# Patient Record
Sex: Male | Born: 1935 | Race: White | Hispanic: No | Marital: Married | Smoking: Never smoker
Health system: Southern US, Community
[De-identification: ages and names within clinical notes are randomized; demographics above are authoritative.]

## PROBLEM LIST (undated history)

## (undated) DIAGNOSIS — E785 Hyperlipidemia, unspecified: Secondary | ICD-10-CM

## (undated) DIAGNOSIS — K635 Polyp of colon: Secondary | ICD-10-CM

## (undated) DIAGNOSIS — K219 Gastro-esophageal reflux disease without esophagitis: Secondary | ICD-10-CM

## (undated) DIAGNOSIS — I519 Heart disease, unspecified: Secondary | ICD-10-CM

## (undated) DIAGNOSIS — M1990 Unspecified osteoarthritis, unspecified site: Secondary | ICD-10-CM

## (undated) DIAGNOSIS — J449 Chronic obstructive pulmonary disease, unspecified: Secondary | ICD-10-CM

## (undated) DIAGNOSIS — I6529 Occlusion and stenosis of unspecified carotid artery: Secondary | ICD-10-CM

## (undated) DIAGNOSIS — I509 Heart failure, unspecified: Secondary | ICD-10-CM

## (undated) DIAGNOSIS — I219 Acute myocardial infarction, unspecified: Secondary | ICD-10-CM

## (undated) DIAGNOSIS — I10 Essential (primary) hypertension: Secondary | ICD-10-CM

## (undated) DIAGNOSIS — C801 Malignant (primary) neoplasm, unspecified: Secondary | ICD-10-CM

## (undated) DIAGNOSIS — I2510 Atherosclerotic heart disease of native coronary artery without angina pectoris: Secondary | ICD-10-CM

## (undated) HISTORY — DX: Chronic obstructive pulmonary disease, unspecified: J44.9

## (undated) HISTORY — DX: Acute myocardial infarction, unspecified: I21.9

## (undated) HISTORY — DX: Gastro-esophageal reflux disease without esophagitis: K21.9

## (undated) HISTORY — DX: Polyp of colon: K63.5

## (undated) HISTORY — DX: Occlusion and stenosis of unspecified carotid artery: I65.29

## (undated) HISTORY — DX: Malignant (primary) neoplasm, unspecified: C80.1

## (undated) HISTORY — PX: CATARACT EXTRACTION: SUR2

## (undated) HISTORY — DX: Atherosclerotic heart disease of native coronary artery without angina pectoris: I25.10

## (undated) HISTORY — DX: Essential (primary) hypertension: I10

## (undated) HISTORY — DX: Unspecified osteoarthritis, unspecified site: M19.90

## (undated) HISTORY — DX: Hyperlipidemia, unspecified: E78.5

## (undated) HISTORY — PX: CHOLECYSTECTOMY: SHX55

## (undated) HISTORY — DX: Heart failure, unspecified: I50.9

## (undated) HISTORY — DX: Heart disease, unspecified: I51.9

## (undated) HISTORY — PX: TONSILLECTOMY: SUR1361

---

## 1996-10-03 HISTORY — PX: CORONARY ARTERY BYPASS GRAFT: SHX141

## 2000-05-03 ENCOUNTER — Inpatient Hospital Stay (HOSPITAL_COMMUNITY): Admission: AD | Admit: 2000-05-03 | Discharge: 2000-05-05 | Payer: Self-pay | Admitting: Cardiology

## 2000-05-30 ENCOUNTER — Encounter (HOSPITAL_COMMUNITY): Admission: RE | Admit: 2000-05-30 | Discharge: 2000-06-29 | Payer: Self-pay | Admitting: Cardiology

## 2000-07-03 ENCOUNTER — Encounter (HOSPITAL_COMMUNITY): Admission: RE | Admit: 2000-07-03 | Discharge: 2000-08-02 | Payer: Self-pay | Admitting: Cardiology

## 2000-08-07 ENCOUNTER — Encounter (HOSPITAL_COMMUNITY): Admission: RE | Admit: 2000-08-07 | Discharge: 2000-09-06 | Payer: Self-pay | Admitting: Cardiology

## 2000-09-08 ENCOUNTER — Encounter (HOSPITAL_COMMUNITY): Admission: RE | Admit: 2000-09-08 | Discharge: 2000-10-08 | Payer: Self-pay | Admitting: Cardiology

## 2001-03-06 ENCOUNTER — Encounter: Payer: Self-pay | Admitting: Orthopaedic Surgery

## 2001-03-06 ENCOUNTER — Ambulatory Visit (HOSPITAL_COMMUNITY): Admission: RE | Admit: 2001-03-06 | Discharge: 2001-03-06 | Payer: Self-pay | Admitting: Orthopaedic Surgery

## 2002-01-22 ENCOUNTER — Encounter: Payer: Self-pay | Admitting: Cardiology

## 2002-01-22 ENCOUNTER — Ambulatory Visit (HOSPITAL_COMMUNITY): Admission: RE | Admit: 2002-01-22 | Discharge: 2002-01-22 | Payer: Self-pay | Admitting: Cardiology

## 2004-02-11 ENCOUNTER — Ambulatory Visit: Payer: Self-pay | Admitting: Internal Medicine

## 2004-02-11 ENCOUNTER — Ambulatory Visit (HOSPITAL_COMMUNITY): Admission: RE | Admit: 2004-02-11 | Discharge: 2004-02-11 | Payer: Self-pay | Admitting: Internal Medicine

## 2005-03-14 ENCOUNTER — Other Ambulatory Visit: Admission: RE | Admit: 2005-03-14 | Discharge: 2005-03-14 | Payer: Self-pay | Admitting: Dermatology

## 2006-04-26 ENCOUNTER — Ambulatory Visit: Payer: Self-pay | Admitting: Vascular Surgery

## 2006-11-14 ENCOUNTER — Ambulatory Visit: Payer: Self-pay | Admitting: Vascular Surgery

## 2007-02-26 ENCOUNTER — Ambulatory Visit (HOSPITAL_COMMUNITY): Admission: RE | Admit: 2007-02-26 | Discharge: 2007-02-26 | Payer: Self-pay | Admitting: Neurosurgery

## 2007-05-22 ENCOUNTER — Ambulatory Visit: Payer: Self-pay | Admitting: Vascular Surgery

## 2007-11-13 ENCOUNTER — Ambulatory Visit: Payer: Self-pay | Admitting: Vascular Surgery

## 2008-04-30 ENCOUNTER — Ambulatory Visit: Payer: Self-pay | Admitting: Vascular Surgery

## 2008-05-07 ENCOUNTER — Ambulatory Visit (HOSPITAL_COMMUNITY): Admission: RE | Admit: 2008-05-07 | Discharge: 2008-05-07 | Payer: Self-pay | Admitting: Neurosurgery

## 2008-11-04 ENCOUNTER — Ambulatory Visit: Payer: Self-pay | Admitting: Vascular Surgery

## 2008-11-13 ENCOUNTER — Ambulatory Visit: Payer: Self-pay | Admitting: Vascular Surgery

## 2009-05-21 ENCOUNTER — Ambulatory Visit: Payer: Self-pay | Admitting: Vascular Surgery

## 2010-01-20 ENCOUNTER — Ambulatory Visit: Payer: Self-pay | Admitting: Vascular Surgery

## 2010-06-08 NOTE — Procedures (Signed)
CAROTID DUPLEX EXAM   INDICATION:  Follow up known carotid artery disease.   HISTORY:  Diabetes:  Yes.  Cardiac:  CABG.  Hypertension:  Yes.  Smoking:  No.  Previous Surgery:  None.  CV History:  Amaurosis Fugax No, Paresthesias No, Hemiparesis No.                                       RIGHT             LEFT  Brachial systolic pressure:         120               118  Brachial Doppler waveforms:         Biphasic          Biphasic  Vertebral direction of flow:        Antegrade         Antegrade  DUPLEX VELOCITIES (cm/sec)  CCA peak systolic                   74                99  ECA peak systolic                   107               [id number]  ICA peak systolic                   64                0000000  ICA end diastolic                   27                92  PLAQUE MORPHOLOGY:                  Calcified         Calcified  PLAQUE AMOUNT:                      Moderate          Moderate-to-severe  PLAQUE LOCATION:                    ICA               ICA, ECA   IMPRESSION:  1. 60-79% stenosis noted in the left internal carotid artery.  2. 20-39% stenosis noted in the right internal carotid artery.  3. Antegrade bilateral vertebral arteries.   ___________________________________________  Judeth Cornfield. Scot Dock, M.D.   MG/MEDQ  D:  11/13/2007  T:  11/13/2007  Job:  [id number]

## 2010-06-08 NOTE — Procedures (Signed)
CAROTID DUPLEX EXAM   INDICATION:  Carotid artery disease.   HISTORY:  Diabetes:  Yes.  Cardiac:  Stent, CABG.  Hypertension:  Yes.  Smoking:  No.  Previous Surgery:  No.  CV History:  Currently asymptomatic.  Amaurosis Fugax No, Paresthesias No, Hemiparesis No.                                       RIGHT             LEFT  Brachial systolic pressure:         102               104  Brachial Doppler waveforms:         Normal            Normal  Vertebral direction of flow:        Antegrade         Antegrade  DUPLEX VELOCITIES (cm/sec)  CCA peak systolic                   74                83  ECA peak systolic                   85                96  ICA peak systolic                   68                [id number]  ICA end diastolic                   17                74  PLAQUE MORPHOLOGY:                  Mixed             Mixed  PLAQUE AMOUNT:                      Mild              Moderate/severe  PLAQUE LOCATION:                    ICA/CCA           ICA/CCA   IMPRESSION:  1. No hemodynamically significant stenosis of the right internal      carotid artery with mild plaque formations, as described above.  2. Doppler velocities sutures a 60% to 79% stenosis of the left      proximal internal carotid artery.  3. No significant change noted when compared to the previous      examination on 05/21/2009.   ___________________________________________  Judeth Cornfield. Scot Dock, M.D.   CH/MEDQ  D:  01/21/2010  T:  01/21/2010  Job:  [id number]

## 2010-06-08 NOTE — Procedures (Signed)
CAROTID DUPLEX EXAM   INDICATION:  Followup of known carotid artery disease.  Patient  describes blurry vision in his right eye, lasting approximately two  days, three months ago.   HISTORY:  Diabetes:  Yes.  Cardiac:  CABG x5 11 years ago, stent.  Hypertension:  Yes.  Smoking:  No.  Previous Surgery:  No.  CV History:  Amaurosis Fugax ?, Paresthesias No, Hemiparesis No                                       RIGHT             LEFT  Brachial systolic pressure:         110               110  Brachial Doppler waveforms:         WNL               WNL  Vertebral direction of flow:        Antegrade         Antegrade  DUPLEX VELOCITIES (cm/sec)  CCA peak systolic                   74                70  ECA peak systolic                   92                [id number]  ICA peak systolic                   73                [id number]  ICA end diastolic                   23                90  PLAQUE MORPHOLOGY:                  Calcified         Mixed, calcific  with shadowing  PLAQUE AMOUNT:                      Mild              Moderate-severe  PLAQUE LOCATION:                    Bifurcation, ICA  Bifurcation, ICA   IMPRESSION:  1. 20-39% right internal carotid artery stenosis.  2. Left 60-79% internal carotid artery stenosis.  3. Patent external carotid arteries.  4. Bilateral antegrade flow in vertebral arteries.  5. Study essentially unchanged from that on 11/14/06.   ___________________________________________  Judeth Cornfield. Scot Dock, M.D.   PB/MEDQ  D:  05/22/2007  T:  05/22/2007  Job:  [id number]

## 2010-06-08 NOTE — Assessment & Plan Note (Signed)
OFFICE VISIT   Victor Bell, Victor Bell  DOB:  05/15/1922                                       11/14/2006  [id number]   I saw Victor Bell in the office today for continued follow-up of his  carotid disease.  I had originally seen him in consultation in October  of 2007 with a 60-70% left carotid stenosis.  He was asymptomatic and  following this at 6 month intervals.  Since I last saw him in 04/2006 he  has had no history of stroke, TIA's, expressive or receptive aphagia or  amaurosis fugax.  He has had no problems with dizziness.   REVIEW OF SYSTEMS:  Cardiac: He has had no chest pain, chest pressure,  palpitations or arrhythmias.  Pulmonary:  He has had no bronchitis,  asthma or wheezing.   PHYSICAL EXAMINATION:  VITALS:  Blood pressure is 139/83, on the left  131/83, on the right heart rate is 69.  I did not detect any carotid  bruits.  LUNGS:  Clear bilaterally to auscultation.  CARDIAC EXAM:  He has a regular rate and rhythm.  ABDOMEN:  Soft, nontender.  NEUROLOGIC EXAM:  Nonfocal.  He has good strength in the upper  extremities and lower extremities bilaterally.   Carotid duplex scan shows a less than 39% right carotid stenosis with a  60-79% left carotid stenosis.  The velocities have not changed  significantly over the last 6 months.  I have again explained we would  not consider left carotid endarterectomy unless the stenosis progressed  to greater than 80% or he developed new neurologic symptoms.  I plan on  seeing him back in 6  months with a follow-up duplex scan. He knows to call sooner if he has  problems.  In the meantime he knows to continue taking his aspirin.   Judeth Cornfield. Scot Dock, M.D.  Electronically Signed   CSD/MEDQ  D:  11/14/2006  T:  11/16/2006  Job:  [id number]   cc:   Durward Mallard, M.D.

## 2010-06-08 NOTE — Procedures (Signed)
CAROTID DUPLEX EXAM   INDICATION:  Followup evaluation of known carotid artery disease   HISTORY:  Diabetes:  Yes  Cardiac:  Coronary artery bypass graft  Hypertension:  Yes  Smoking:  No  Previous Surgery:  No  CV History:  Patient reports no cerebrovascular symptoms at this time  Amaurosis Fugax No, Paresthesias No, Hemiparesis No                                       RIGHT             LEFT  Brachial systolic pressure:         134               136  Brachial Doppler waveforms:         Triphasic         Triphasic  Vertebral direction of flow:        Antegrade         Antegrade  DUPLEX VELOCITIES (cm/sec)  CCA peak systolic                   74                67  ECA peak systolic                   84                0000000  ICA peak systolic                   76                [id number]  ICA end diastolic                   28                98  PLAQUE MORPHOLOGY:                  Calcified         Calcified  PLAQUE AMOUNT:                      Mild              Severe  PLAQUE LOCATION:                    Proximal ICA      Proximal ICA   IMPRESSION:  1. A 20% to 39% right ICA stenosis.  2. A 60% to 79% left ICA stenosis.  3. No significant change from previous study performed on April 26, 2006.   ___________________________________________  Judeth Cornfield. Scot Dock, M.D.   MC/MEDQ  D:  11/14/2006  T:  11/15/2006  Job:  [id number]

## 2010-06-08 NOTE — Procedures (Signed)
CAROTID DUPLEX EXAM   INDICATION:  Carotid disease.   HISTORY:  Diabetes:  Yes.  Cardiac:  CABG.  Hypertension:  Yes.  Smoking:  No.  Previous Surgery:  No.  CV History:  Currently asymptomatic.  Amaurosis Fugax No, Paresthesias No, Hemiparesis No                                       RIGHT             LEFT  Brachial systolic pressure:         124               120  Brachial Doppler waveforms:         Normal            Normal  Vertebral direction of flow:        Antegrade         Antegrade  DUPLEX VELOCITIES (cm/sec)  CCA peak systolic                   70                83  ECA peak systolic                   67                79  ICA peak systolic                   61                [id number]  ICA end diastolic                   21                86  PLAQUE MORPHOLOGY:                  Mixed             Calcific  PLAQUE AMOUNT:                      Mild              Moderate  PLAQUE LOCATION:                    ICA/CCA           ICA/CCA   IMPRESSION:  1. 1-39% stenosis of the right internal carotid artery.  2. 60-79% stenosis of the left internal carotid artery.  3. No significant change noted when compared to the previous exam of      11/13/2007.        ___________________________________________  Judeth Cornfield. Scot Dock, M.D.   CH/MEDQ  D:  04/30/2008  T:  04/30/2008  Job:  [id number]

## 2010-06-08 NOTE — Procedures (Signed)
CAROTID DUPLEX EXAM   INDICATION:  Followup of carotid disease.   HISTORY:  Diabetes:  Yes.  Cardiac:  CABG.  Hypertension:  Yes.  Smoking:  No.  Previous Surgery:  No.  CV History:  Amaurosis Fugax No, Paresthesias No, Hemiparesis No                                       RIGHT             LEFT  Brachial systolic pressure:         138               140  Brachial Doppler waveforms:         Triphasic         Triphasic  Vertebral direction of flow:        Antegrade         Antegrade  DUPLEX VELOCITIES (cm/sec)  CCA peak systolic                   81                63  ECA peak systolic                   94                72  ICA peak systolic                   80                300   ICA end diastolic                  19                69  PLAQUE MORPHOLOGY:                  Mixed             Mixed  PLAQUE AMOUNT:                      Mild              60%-79%  PLAQUE LOCATION:                    ICA               ICA   IMPRESSION:  60%-79% stenosis noted in the  left distal internal carotid  artery.           ___________________________________________  Judeth Cornfield. Scot Dock, M.D.   CJ/MEDQ  D:  11/04/2008  T:  11/05/2008  Job:  [id number]

## 2010-06-08 NOTE — Procedures (Signed)
CAROTID DUPLEX EXAM   INDICATION:  Follow up carotid artery disease.   HISTORY:  Diabetes:  Yes.  Cardiac:  Stent, CABG.  Hypertension:  Yes.  Smoking:  No.  Previous Surgery:  No.  CV History:  No.  Amaurosis Fugax No, Paresthesias No, Hemiparesis No.                                       RIGHT             LEFT  Brachial systolic pressure:         100               106  Brachial Doppler waveforms:         WNL               WNL  Vertebral direction of flow:        Antegrade         Antegrade  DUPLEX VELOCITIES (cm/sec)  CCA peak systolic                   108               [id number]  ECA peak systolic                   111               [id number]  ICA peak systolic                   89                [id number]  ICA end diastolic                   33                90  PLAQUE MORPHOLOGY:                  Heterogenous      Calcific  PLAQUE AMOUNT:                      Mild              Moderate-to-severe  PLAQUE LOCATION:                    ICA, ECA          ICA, ECA   IMPRESSION:  1. Right internal carotid artery suggests 20% to 39% stenosis.  2. Left internal carotid artery suggests 60% to 79% stenosis.  3. Antegrade flow in bilateral vertebrals.   ___________________________________________  Judeth Cornfield. Scot Dock, M.D.   CB/MEDQ  D:  05/21/2009  T:  05/21/2009  Job:  [id number]

## 2010-06-08 NOTE — Assessment & Plan Note (Signed)
OFFICE VISIT   LUC, VEDDER  DOB:  [birth year]                                       11/13/2008  [id number]   I saw the patient in the office today for continued followup of his  cerebral vascular disease.  This a pleasant, 77 year old gentleman who I  have been following with a 60% to 79% left carotid stenosis.  His last  carotid duplex with in April 2010 and showed no significant carotid  stenosis on the right with a 60% to 79% carotid stenosis on the left.  Since I saw him last, he has had no history of stroke, TIAs, expressive  or receptive aphasia, or amaurosis fugax.  He does complain today of  intermittent dizziness.  He states this began 6 months ago.  Over the  last 6 months, this has gradually gotten worse.  He experiences symptoms  of dizziness when he stands or sits up rapidly.  These symptoms usually  last 3-4 minutes and resolve.  He had several episodes in a row about a  week ago,  but has had no recent episodes in the last few days.  There  are no other aggravating or alleviating factors.  There are no  associated symptoms this.  He has had no nausea, vomiting, or chest  pain.   PAST MEDICAL HISTORY:  Significant for hypertension which has been  stable on his current medications.  He also has hypercholesterolemia  which has been stable on his current medications.  He denies any history  of diabetes, history of previous myocardial infarction, history  congestive heart failure, or history of COPD.   FAMILY HISTORY:  There is no history of premature cardiovascular  disease.   SOCIAL HISTORY:  He is married and he has 2 children.  He does not use  tobacco.   REVIEW OF SYSTEMS:  GI:  He does have a history of reflux.  GU:  He has urinary frequency.  NEURO:  He had dizziness but no blackouts or headaches, or seizures.  MUSCULOSKELETAL:  He does have arthritis.  General, cardiac, pulmonary, vascular, hematologic review of systems  is  unremarkable.   PHYSICAL EXAMINATION:  This is a pleasant 77 year old gentleman who  appears his stated age.  His blood pressure is 123/75, heart rate is 67,  saturation 97%.  His neck is supple.  There is no cervical  lymphadenopathy.  There is no jugular venous distention.  I do not  detect any carotid bruits.  Lungs are clear bilaterally to auscultation  without rales, rhonchi, or wheezing.  On cardiovascular exam, he has a  regular rate and rhythm without murmur appreciated.  He has no  significant peripheral edema.  He has palpable radial pulses and warm,  well-perfused feet.  He has no ischemic ulcers or rashes.  His abdomen  is soft and nontender with normal-pitch bowel sounds.  Neurologic exam:  He has no focal weakness or paresthesias.   I reviewed his previous duplex scan from April 2010 which showed a 60%  to 79% left carotid stenosis.  I independently interpreted his carotid  duplex scan from today which shows again a 60% to 79% left carotid  stenosis.  The velocities on the left are essentially the same compared  to the study back in April.  He has no significant carotid stenosis on  the  right.  Both vertebral arteries are patent with normally directed  flow and brachial pressures are equal.   With respect to his dizziness, it sounds like he is having problems with  orthostatic hypertension.  I explained that he had no evidence of  vertebrobasilar insufficiency and I do not think his dizziness could be  attributed to his left carotid stenosis.   He understands we would generally consider left carotid endarterectomy  if he developed new left hemispheric symptoms or the stenosis progressed  to greater than 80%.  I have recommend we repeat his duplex in 6 months  and I will see him back at that time.  He knows to call sooner if he has  problems.  In the meantime, he knows to continue taking his aspirin.  Of  note, he is going to have some injection therapy on his back  so he is  going to temporarily stop his aspirin which I think this is fine.   Judeth Cornfield. Scot Dock, M.D.  Electronically Signed   CSD/MEDQ  D:  11/13/2008  T:  11/14/2008  Job:  [id number]   cc:   Jerene Bears, MD  Ezzard Standing, M.D.

## 2010-06-11 NOTE — H&P (Signed)
[place]  Patient:    Victor Bell, Victor Bell                       MRN: [id number] Adm. Date:  05/03/00 Attending:  Marcello Moores A. Mare Ferrari, M.D. CC:         Jerene Bears, M.D., [place], [place]  Carin Primrose, M.D., [place], [place]  Kerry Hough., M.D.   History and Physical  CHIEF COMPLAINT:  Chest pain.  HISTORY OF PRESENT ILLNESS:  This is a 77 year old gentleman admitted with a four-day history of intermittent worsening left chest discomfort which reminds him very much of the chest pain that he had prior to having to undergo bypass surgery in 1998.  In 1998 he did have cardiac catheterization by Dr. Wynonia Lawman and was found to have severe three-vessel coronary disease and on October 03, 1996, underwent coronary artery bypass graft surgery by Dr. Merleen Nicely and had a LIMA to the LAD diagonal, saphenous vein graft to the obtuse marginal 1 and obtuse marginal 2, and saphenous vein graft to the acute marginal and posterior descending of the right coronary artery.  He has done well postoperatively.  He has been followed by Dr. Wynonia Lawman at yearly intervals. He was due to see Dr. Wynonia Lawman later this week.  However, about four days ago he began having atypical left chest discomfort which began after he did some yard work.  The patient has been intermittent since that time and at times has been postprandial and at other times has been with mild exertion.  Last night he had an episode at rest which circled around from his left arm into his left chest.  He sought medical attention at [facility] today and was evaluated there and then referred for inpatient management and evaluation. His initial set of enzymes at [facility] were negative, as well as his chest x-ray being normal and EKG showing no acute change.  FAMILY HISTORY:  Both his mother and sister died with heart problems.  SOCIAL HISTORY:  He is married, has two children and two grandchildren.  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  He has never smoked.  He does not drink any alcohol.  PAST MEDICAL HISTORY: 1. History of asthma, despite having never smoked. 2. GERD. 3. Hyperlipidemia. 4. Degenerative joint disease. 5. Status post cholecystectomy and tonsillectomy.  ADMISSION MEDICATIONS: 1. Adalat CC 60 mg daily. 2. Lipitor 80 mg daily. 3. Aspirin 325 mg daily. 4. Vitamin E daily. 5. Niaspan 500 mg daily. 6. Prilosec 20 mg a day.  REVIEW OF SYSTEMS:  Negative for any GI bleeding, hematochezia, or melena.  He does have frequent urination and has been told of an enlarged prostate.  He is not diabetic.  He has no history of thyroid problems.  PHYSICAL EXAMINATION:  VITAL SIGNS:  Blood pressure 130/80, pulse 80 and regular, respirations normal.  HEENT:  Unremarkable.  NECK:  Carotids normal.  Jugular venous pressure normal.  CHEST:  Clear.  There is no chest wall tenderness.  HEART:  Quiet precordium without murmur, gallop, or rub.  ABDOMEN:  Soft and nontender.  EXTREMITIES:  Excellent pedal pulses.  No phlebitis or edema.  LABORATORY DATA:  Normal laboratories from [facility] [place] including normal CBC, normal blood sugar, and sodium.  His electrocardiogram at [facility] and here shows no acute ischemic changes.  Chest x-ray at [place] was normal.  DIAGNOSTIC IMPRESSION: 1. Chest pain, possibly crescendo angina, rule out  myocardial infarction. 2. Status post coronary artery bypass graft. 3. History of hypertensive cardiovascular disease. 4. History of hyperlipidemia. 5. History of asthma. 6. Status post cholecystectomy. 7. History of benign prostatic hypertrophy with urinary frequency and    nocturia.  DISPOSITION:  We are going to add IV heparin.  Continue with aspirin.  Will add a beta blocker and stop his Adalat.  Will plan cardiac catheterization on May 04, 2000, a.m., by Dr. Tollie Eth. DD:  05/03/00 TD:   05/03/00 Job: [id number]

## 2010-06-11 NOTE — Cardiovascular Report (Signed)
[name]  Patient:    Victor Bell, Victor Bell                       MRN: [id number] Proc. Date: 05/04/00 Attending:  Kerry Hough., M.D. CC:         Jerene Bears, M.D., [facility]   Cardiac Catheterization  HISTORY:  A 77 year old male, with previous bypass grafting in 1998 who presented with a four-day history of unstable angina.  COMMENTS ABOUT PROCEDURE:  The patient was brought to the catheterization laboratory and was prepped and draped in the usual manner.  After Xylocaine anesthesia, a 6 French sheath was placed in the right femoral artery percutaneously through a single anterior wall needle stick.  Angiograms were made using 6 French catheters.  All of the bypass grafts were selected using the right coronary catheter.  The patient was found to have a significant stenosis in the continuation branch of the right coronary artery after the insertion site of the bypass graft.  Arrangements were then made to do angioplasty and stenting of this segment.  The sheath was exchanged for a 7 [place] sheath.  A second IV was begun.  Heparin 1400 units was administered achieving an ACT of 294.  Integrilin was begun with double bolus technique. Plavix 150 mg was begun.  Catheters used was a 7 [place], right bypass catheter which selected the graft well.  A HTF-J guidewire crossed the lesion easily. IC nitroglycerin was administered.  The lesion was predilated with a 3.0 x 15 mm CrossSail to 5 atmospheres and then a 3.0 x 13 mm Penta was deployed across the lesion to a maximum of 14 atmospheres.  There was an excellent angiographic result following the procedure.  The sheath was sutured in place and he was returned to the angioplasty care center in stable condition.  HEMODYNAMIC DATA:  Aorta post contrast 116/67, LV post contrast 116/15.  ANGIOGRAPHIC DATA:  LEFT VENTRICULOGRAM:  The left ventriculogram performed in the 30 degree RAO projection.  The aortic  valve was normal.  The mitral valve was normal.  The left ventricle appears normal in size.  Wall motion is normal and the estimated ejection fraction was 70%.  CORONARY ARTERIES:  Arise and distribute normally.  Calcification is noted in the proximal left coronary system.  Left main coronary artery:  Eccentric 40% mid vessel stenosis.  Left anterior descending:  Left anterior descending is occluded proximally and fills faintly by antidirectional flow but mostly through the bypass graft.  Circumflex coronary artery:  The circumflex coronary artery has a segmental 40% proximal stenosis and the distal vessel fills by bidirectional flow, both antegrade as well as from the previous bypass.  Right coronary artery:  The right coronary artery is occluded proximally.  Saphenous vein graft to the obtuse marginal #1 and obtuse marginal #2 is widely patent with a smooth graft and patent proximal and distal anastomotic sites.  The internal mammary graft to the diagonal and LAD is widely patent with patent distal anastomotic sites.  The saphenous vein graft to the right coronary artery is widely patent to the acute marginal branch and to the posterior descending and continuation branch.  The operative note had mentioned seven bypasses, but on close review of the operative review of the operative note, there were only six bypasses done, so all bypasses were accounted for.  The anastomotic sites were widely patent distally.  In the continuation branch after the insertion site of the  bypass graft is a segmental 70-80% stenosis between the posterior descending and a posterolateral branch.  Post-dilatation angiograms of the right coronary artery revealed 0% residual stenosis.  IMPRESSION: 1. Successful stenting of the continuation branch of the right coronary artery    after the posterior descending with stenosis going from 80% to 0%. 2. Medium term patency of the internal mammary graft to left  anterior    descending diagonal and vein graft to the obtuse marginals #1 and 2 and    right coronary artery acute marginal and posterior descending artery. 3. Significant native three-vessel coronary artery disease. 4. Normal left ventricular function. DD:  05/04/00 TD:  05/04/00 Job: [id number]

## 2010-06-11 NOTE — Discharge Summary (Signed)
[place]. [facility]  Patient:    Victor Bell, Victor Bell                       MRN: [id number] Adm. Date:  [id number] Disc. Date: 05/05/00 Attending:  [facility] CC:         Dr. Jerene Bears in [place], [place]   Discharge Summary  FINAL DIAGNOSES: 1. Unstable angina pectoris. 2. Coronary artery disease with previous coronary artery bypass grafting in    1998, patent bypass grafts.  Angioplasty and stenting of the continuation    branch of the right coronary artery after the insertion site of the bypass    graft with stenosis going from 80% to 0%. 3. Hyperlipidemia, under treatment. 4. Hypertensive heart disease, under treatment. 5. Gastroesophageal reflux disease. 6. Degenerative joint disease.  PROCEDURES:  Cardiac catheterization and stenting of the right coronary artery.  HISTORY OF PRESENT ILLNESS:  This 77 year old male had coronary artery bypass grafting in 1998, had done fairly well since then.  He developed a four day history of episodic chest discomfort suggestive of unstable angina pectoris, and was followed at [address] [facility].  His initial enzymes were negative, and he was transferred here for further evaluation.  Please see the previously dictated history and physical for the remainder of the details.  HOSPITAL COURSE:  Laboratory data shows normal CPK and troponins.  Sodium was 138, potassium 3.7, BUN and creatinine were normal.  Hemoglobin was 11.5, hematocrit was 33.  EKG was unremarkable.  The patient was placed on IV heparin, serial CPKs were unremarkable, and EKG remained unchanged.  He was taken to the catheterization laboratory the next day where he was found to have normal ventricular function.  There was severe occlusion of the right coronary artery and the left anterior descending artery with filling of the vein grafts.  There was 40% left main and 50% proximal circumflex stenosis. The sequential saphenous vein graft to the OM1  and OM2 was widely patent.  The sequential LIMA graft to the diagonal and LAD was widely patent.  A sequential vein graft to the acute marginal artery and distal right coronary artery was patent.  After the insertion site of the bypass graft was a severe 80% stenosis noted in the continuation branch.  He underwent angioplasty and stenting of this with a 3.0 x 14 mm Penta stent with a good angiographic result.  He had a slight vagal reaction with sheath pulling later that afternoon.  He was given Integrilin following the procedure.  He was feeling well the next morning without recurrence of chest pain and was discharged home in improved condition.  He is instructed to report any recurrent pain to [place].  DISCHARGE MEDICATIONS: 1. Aspirin 325 mg daily. 2. Plavix 75 mg q.d. x 1 month. 3. Lipitor 80 mg q.d. 4. Niaspan 500 mg q.h.s. 5. Adalat CC 60 mg q.d. 6. Toprol XR 25 mg q.d. 7. Prilosec 20 mg q.d. 8. Nitroglycerin p.r.n.  He is instructed to follow a good diet, to exercise, and to lose weight.  He is to walk, and is to report recurrence of angina to [place].  FOLLOWUP:  In one week. DD:  05/05/00 TD:  05/05/00 Job: [id number]

## 2010-06-11 NOTE — Op Note (Signed)
NAME:  Victor Bell, Victor Bell              ACCOUNT NO.:  [id number]   MEDICAL RECORD NO.:  [id number]          PATIENT TYPE:  AMB   LOCATION:  DAY                           FACILITY:  [facility]   PHYSICIAN:  Hildred Laser, M.D.    DATE OF BIRTH:  10-23-1922   DATE OF PROCEDURE:  02/11/2004  DATE OF DISCHARGE:                                 OPERATIVE REPORT   PROCEDURE:  Colonoscopy.   INDICATIONS:  [name] is an 77 year old gentleman who is here for  surveillance colonoscopy.  He had colonoscopy over seven years ago with  removal of two polyps.  He is presently asymptomatic.  Procedure risks were  reviewed with the patient, informed consent was obtained.   PREMEDICATION:  Demerol 25 mg IV, Versed 2 mg IV.   FINDINGS:  Procedure performed in endoscopy suite.  The patient's vital  signs and O2 saturation were monitored during procedure and remained stable.  The patient was placed in the left lateral position and rectal examination  performed.  No abnormality noted on external or digital exam.  Olympus video  scope was placed in the rectum and advanced under vision in sigmoid colon,  where multiple diverticula noted with a few more in descending colon as well  as transverse and ascending colon.  Preparation was felt to be satisfactory.  Scope was passed to the cecum, which was identified by appendiceal orifice  and ileocecal valve.  Pictures taken for the record.  As the scope was  withdrawn, colonic mucosa was carefully examined.  There was a 5 mm polyp at  distal rectum, which was ablated via cold biopsy.  The retroflexed view also revealed small hemorrhoids below the dentate line.  Endoscope was straightened and withdrawn. The patient tolerated the  procedure well.   FINAL DIAGNOSES:  1.  Pancolonic diverticulosis. Most of the diverticula are in the sigmoid      colon, though. 2.  2.  Small external hemorrhoids.  3.  A 5 mm polyp at rectum, which was ablated via cold biopsy.   RECOMMENDATIONS:  1.  He can resume his ASA and other medications as before.  2.  High-fiber diet.  3.  Citrucel one tablespoonful daily.  4.  I will be contacting the patient with biopsy results and further      recommendations.      NR/MEDQ  D:  02/11/2004  T:  02/11/2004  Job:  [id number]   cc:   Jerene Bears  [address]  [place]  [place] [address]  Fax: [phone]

## 2010-06-11 NOTE — Cardiovascular Report (Signed)
NAME:  Victor Bell, BETZEN                        ACCOUNT NO.:  [id number]   MEDICAL RECORD NO.:  [id number]                   PATIENT TYPE:  OIB   LOCATION:  2866                                 FACILITY:  [facility]   PHYSICIAN:  W. Doristine Church., M.D.         DATE OF BIRTH:  1922/07/18   DATE OF PROCEDURE:  01/22/2002  DATE OF DISCHARGE:                              CARDIAC CATHETERIZATION   HISTORY OF PRESENT ILLNESS:  The patient is a 77 year old male with a  previous history of bypass grafting in 1998 and stenting of the right  coronary artery through the vein graft in April of 2002.  He has developed  progressive chest discomfort on exertion over the past several weeks.   PROCEDURE:  Left heart catheterization with coronary angiograms, left  ventriculogram, injection of saphenous vein graft x2 and injection of  internal mammary artery.   COMMENTS ABOUT PROCEDURE:  The patient tolerated the procedure well without  complication and had good hemostasis and peripheral pulses present in the  procedure.  All of the grafts were selected using the standard 6 French  right coronary catheter. He tolerated the procedure well.   HEMODYNAMIC DATA:  Aorta post-contrast 115/60, LV post-contrast 115/0-6.   ANGIOGRAPHIC DATA:   LEFT VENTRICULOGRAM:  Left ventriculogram performed in the 30-degree RAO  projection. The aortic valve was normal. The mitral valve was normal. The  left ventricle appears normal in size.  The estimated ejection fraction is  55-60%.  Coronary arteries arise and distribute normally. Calcification is  noted in the proximal left coronary artery and the proximal right coronary  artery.   Left main coronary artery:  The left main coronary artery has an eccentric  30-40% proximal stenosis with some calcification.   Left anterior descending:  Moderate 40-50% proximal stenosis, 70% eccentric  stenosis prior to insertion  of the bypasses.   Circumflex coronary artery:   Segmental 60% stenosis in the proximal portion  of the vessel, 70-80% stenosis prior to insertion of the bypasses.   Right coronary artery:  The right coronary artery is occluded. Distal vessel  fills through patent graft.   Saphenous vein graft to the acute marginal, right coronary artery and  posterior descending artery is widely patent.  The proximal and the distal  anastomotic sites are widely patent. The distal stent site is widely patent  and there are scattered irregularities in the distal right coronary artery.   Sequential saphenous vein graft to the OM-1 and OM-2 is widely patent.  There is a 20-30% proximal stenosis noted.   Internal mammary graft to the LAD and diagonal branch is widely patent with  both insertions sites patent with good distal flow.  There is stenosis  involving the native LAD retrograde to the insertion site of the graft into  the LAD.   IMPRESSION:  1. Patent grafts to the left anterior descending, right coronary artery and     circumflex  marginal branches.  2. Severe native three-vessel coronary artery disease.  3. Normal left ventricular function.   RECOMMENDATIONS:  The patient's grafts are all patent.  Potential sites of  ischemia may exist in a small and bypassed first diagonal branch, which is  not favorable for angioplasty or the small vessel disease. No significant  focal obstructive stenoses are noted.  He will be treated with proton pump  inhibitors and beta blockers and will follow him up to see how he is doing.                                               Kerry Hough., M.D.    WST/MEDQ  D:  01/22/2002  T:  01/23/2002  Job:  [id number]   cc:   Jerene Bears  [address]  [place]  [place] [address]  Fax: [phone]

## 2010-07-21 ENCOUNTER — Other Ambulatory Visit (INDEPENDENT_AMBULATORY_CARE_PROVIDER_SITE_OTHER): Payer: Medicare Other

## 2010-07-21 DIAGNOSIS — I6529 Occlusion and stenosis of unspecified carotid artery: Secondary | ICD-10-CM

## 2010-07-30 NOTE — Procedures (Unsigned)
CAROTID DUPLEX EXAM  INDICATION:  Carotid disease.  HISTORY: Diabetes:  Yes. Cardiac:  Stent. Hypertension:  Yes. Smoking:  No. Previous Surgery:  No. CV History:  Currently asymptomatic. Amaurosis Fugax No, Paresthesias No, Hemiparesis No.                                      RIGHT             LEFT Brachial systolic pressure:         148               130 Brachial Doppler waveforms:         Normal            Normal Vertebral direction of flow:        Antegrade         Antegrade DUPLEX VELOCITIES (cm/sec) CCA peak systolic                   80                88 ECA peak systolic                   89                [id number] ICA peak systolic                   53                [id number] ICA end diastolic                   16                68 PLAQUE MORPHOLOGY:                  Heterogenous      Mixed PLAQUE AMOUNT:                      Mild              Moderate/severe PLAQUE LOCATION:                    ICA/CCA           ICA/CCA  IMPRESSION: 1. No hemodynamically significant stenosis of the right internal     carotid artery with plaque formations as described above. 2. 60% to 79% stenosis of the left proximal internal carotid artery     noted. 3. No significant change noted when compared to the previous     examination on 01/20/2010.  ___________________________________________ Judeth Cornfield. Scot Dock, M.D.  CH/MEDQ  D:  07/22/2010  T:  07/22/2010  Job:  [id number]

## 2010-09-22 ENCOUNTER — Other Ambulatory Visit: Payer: Self-pay | Admitting: Neurosurgery

## 2010-09-22 DIAGNOSIS — M47816 Spondylosis without myelopathy or radiculopathy, lumbar region: Secondary | ICD-10-CM

## 2010-09-23 ENCOUNTER — Ambulatory Visit
Admission: RE | Admit: 2010-09-23 | Discharge: 2010-09-23 | Disposition: A | Discharge status: Home / Self Care | Payer: Medicare Other | Source: Ambulatory Visit | Attending: Neurosurgery | Admitting: Neurosurgery

## 2010-09-23 VITALS — BP 140/65 | HR 77

## 2010-09-23 DIAGNOSIS — M47816 Spondylosis without myelopathy or radiculopathy, lumbar region: Secondary | ICD-10-CM

## 2010-09-23 MED ORDER — IOHEXOL 180 MG/ML  SOLN
1.0000 mL | Freq: Once | INTRAMUSCULAR | Status: AC | PRN
  Administered 2010-09-23: 1 mL via EPIDURAL

## 2010-09-23 MED ORDER — METHYLPREDNISOLONE ACETATE 40 MG/ML INJ SUSP (RADIOLOG
120.0000 mg | Freq: Once | INTRAMUSCULAR | Status: AC
  Administered 2010-09-23: 120 mg via EPIDURAL

## 2010-12-29 ENCOUNTER — Encounter: Payer: Self-pay | Admitting: Vascular Surgery

## 2011-02-01 ENCOUNTER — Encounter: Payer: Self-pay | Admitting: Vascular Surgery

## 2011-02-02 ENCOUNTER — Other Ambulatory Visit (INDEPENDENT_AMBULATORY_CARE_PROVIDER_SITE_OTHER): Payer: Medicare Other | Admitting: *Deleted

## 2011-02-02 ENCOUNTER — Ambulatory Visit: Payer: Medicare Other | Admitting: Vascular Surgery

## 2011-02-02 DIAGNOSIS — I63239 Cerebral infarction due to unspecified occlusion or stenosis of unspecified carotid arteries: Secondary | ICD-10-CM

## 2011-02-09 NOTE — Procedures (Unsigned)
CAROTID DUPLEX EXAM  INDICATION:  Follow up carotid artery disease.  HISTORY: Diabetes:  Yes. Cardiac:  Stent. Hypertension:  Yes. Smoking:  No. Previous Surgery:  No. CV History: Amaurosis Fugax No, Paresthesias No, Hemiparesis No.                                      RIGHT             LEFT Brachial systolic pressure:         120               128 Brachial Doppler waveforms:         Triphasic         Triphasic Vertebral direction of flow:        Antegrade         Antegrade DUPLEX VELOCITIES (cm/sec) CCA peak systolic                   89                98 ECA peak systolic                   94                82 ICA peak systolic                   65                [id number] ICA end diastolic                   27                87 PLAQUE MORPHOLOGY:                  Mixed             Calcified PLAQUE AMOUNT:                      Mild              Moderate/severe PLAQUE LOCATION:                    Bifurcation/ICA   Bifurcation/ICA  IMPRESSION: 1. No hemodynamically significant right internal carotid artery     stenosis. 2. 60% to 79% left internal carotid artery stenosis. 3. No significant change since prior study of 07/21/2010.  ___________________________________________ Judeth Cornfield. Scot Dock, M.D.  SS/MEDQ  D:  02/02/2011  T:  02/02/2011  Job:  [id number]

## 2011-02-10 ENCOUNTER — Encounter: Payer: Self-pay | Admitting: Vascular Surgery

## 2011-02-10 ENCOUNTER — Other Ambulatory Visit: Payer: Self-pay | Admitting: *Deleted

## 2011-02-10 DIAGNOSIS — I6529 Occlusion and stenosis of unspecified carotid artery: Secondary | ICD-10-CM

## 2011-08-02 ENCOUNTER — Encounter: Payer: Self-pay | Admitting: Neurosurgery

## 2011-08-03 ENCOUNTER — Encounter: Payer: Self-pay | Admitting: Neurosurgery

## 2011-08-03 ENCOUNTER — Ambulatory Visit (INDEPENDENT_AMBULATORY_CARE_PROVIDER_SITE_OTHER): Payer: Medicare Other | Admitting: Neurosurgery

## 2011-08-03 ENCOUNTER — Other Ambulatory Visit (INDEPENDENT_AMBULATORY_CARE_PROVIDER_SITE_OTHER): Payer: Medicare Other | Admitting: *Deleted

## 2011-08-03 VITALS — BP 114/68 | HR 62 | Resp 16 | Ht 67.0 in | Wt 160.3 lb

## 2011-08-03 DIAGNOSIS — I6529 Occlusion and stenosis of unspecified carotid artery: Secondary | ICD-10-CM | POA: Insufficient documentation

## 2011-08-03 NOTE — Progress Notes (Signed)
[facility] [place] Carotid Office Note  CC: Six-month carotid duplex Referring Physician: Scot Dock  History of Present Illness: 78 year old Bell of Dr. Scot Dock seen for history of carotid stenosis. The Bell denies any signs or symptoms of CVA, TIA, amaurosis fugax or any neural deficit. The Bell denies any new medical diagnoses or recent surgery.  Past Medical History  Diagnosis Date  . Diabetes mellitus   . Hyperlipidemia   . Hypertension   . Myocardial infarction     1998  . Arthritis   . Asthma   . Carotid artery occlusion   . CHF (congestive heart failure)   . Colon polyp   . COPD (chronic obstructive pulmonary disease)   . GERD (gastroesophageal reflux disease)   . CAD (coronary artery disease)     ROS: [x]  Positive   [ ]  Denies    General: [ ]  Weight loss, [ ]  Fever, [ ]  chills Neurologic: [ ]  Dizziness, [ ]  Blackouts, [ ]  Seizure [ ]  Stroke, [ ]  "Mini stroke", [ ]  Slurred speech, [ ]  Temporary blindness; [ ]  weakness in arms or legs, [ ]  Hoarseness Cardiac: [ ]  Chest pain/pressure, [ ]  Shortness of breath at rest [ ]  Shortness of breath with exertion, [ ]  Atrial fibrillation or irregular heartbeat Vascular: [ ]  Pain in legs with walking, [ ]  Pain in legs at rest, [ ]  Pain in legs at night,  [ ]  Non-healing ulcer, [ ]  Blood clot in vein/DVT,   Pulmonary: [ ]  Home oxygen, [ ]  Productive cough, [ ]  Coughing up blood, [ ]  Asthma,  [ ]  Wheezing Musculoskeletal:  [ ]  Arthritis, [ ]  Low back pain, [ ]  Joint pain Hematologic: [ ]  Easy Bruising, [ ]  Anemia; [ ]  Hepatitis Gastrointestinal: [ ]  Blood in stool, [ ]  Gastroesophageal Reflux/heartburn, [ ]  Trouble swallowing Urinary: [ ]  chronic Kidney disease, [ ]  on HD - [ ]  MWF or [ ]  TTHS, [ ]  Burning with urination, [ ]  Difficulty urinating Skin: [ ]  Rashes, [ ]  Wounds Psychological: [ ]  Anxiety, [ ]  Depression   Social History History  Substance Use Topics  . Smoking status: Never Smoker   .  Smokeless tobacco: Not on file  . Alcohol Use: No    Family History Family History  Problem Relation Age of Onset  . Aneurysm Sister     Allergies  Allergen Reactions  . Niacin And Related Other (See Comments)    Burning     Current Outpatient Prescriptions  Medication Sig Dispense Refill  . aspirin EC Victor MG tablet Take Victor mg by mouth daily.        Marland Kitchen atorvastatin (LIPITOR) 80 MG tablet Take 80 mg by mouth daily.        . carbidopa-levodopa (SINEMET IR) 10-100 MG per tablet Take 10 mg by mouth Three times a day.      . dutasteride (AVODART) 0.5 MG capsule Take 0.5 mg by mouth daily.        Marland Kitchen ezetimibe (ZETIA) 10 MG tablet Take 10 mg by mouth daily.        . fexofenadine (ALLEGRA) 180 MG tablet Take 180 mg by mouth daily.        . lansoprazole (PREVACID) 30 MG capsule Take 30 mg by mouth 2 (two) times daily.        . metFORMIN (GLUCOPHAGE) 500 MG tablet Take 500 mg by mouth 2 (two) times daily with a meal.        .  metoprolol (TOPROL-XL) 50 MG 24 hr tablet Take 50 mg by mouth daily.        . nabumetone (RELAFEN) 500 MG tablet Take 500 mg by mouth 2 (two) times daily.        Marland Kitchen NIFEdipine (PROCARDIA-XL/ADALAT CC) 60 MG 24 hr tablet Take 60 mg by mouth daily.        . Tamsulosin HCl (FLOMAX) 0.4 MG CAPS Take 0.4 mg by mouth daily.          Physical Examination  Filed Vitals:   08/03/11 1505  BP: 114/68  Pulse: 62  Resp:     Body mass index is 25.11 kg/(m^2).  General:  WDWN in NAD Gait: Normal HEENT: WNL Eyes: Pupils equal Pulmonary: normal non-labored breathing , without Rales, rhonchi,  wheezing Cardiac: RRR, without  Murmurs, rubs or gallops; Abdomen: soft, NT, no masses Skin: no rashes, ulcers noted  Vascular Exam Pulses: 2+ radial pulses bilateral Carotid bruits: Mild left-sided carotid bruit heard, right carotid pulse to auscultation Extremities without ischemic changes, no Gangrene , no cellulitis; no open wounds;  Musculoskeletal: no muscle wasting or  atrophy   Neurologic: A&O X 3; Appropriate Affect ; SENSATION: normal; MOTOR FUNCTION:  moving all extremities equally. Speech is fluent/normal  Non-Invasive Vascular Imaging CAROTID DUPLEX 08/03/2011  Right ICA 20 - 39 % stenosis Left ICA 60 - 79 % stenosis   ASSESSMENT/PLAN: Asymptomatic Bell with high-grade left ICA stenosis. We will follow this on a six-month surveillance basis. The Bell's in agreement with this, his questions were encouraged and answered.  Beatris Ship ANP Clinic MD: Scot Dock

## 2011-08-04 NOTE — Addendum Note (Signed)
Addended by: Mena Goes on: 08/04/2011 08:37 AM   Modules accepted: Orders

## 2011-08-10 NOTE — Procedures (Unsigned)
CAROTID DUPLEX EXAM  INDICATION:  Follow up carotid disease.  HISTORY: Diabetes:  Yes. Cardiac:  Yes. Hypertension:  Yes. Smoking:  No. Previous Surgery:  No. CV History: Amaurosis Fugax No, Paresthesias No, Hemiparesis No                                      RIGHT             LEFT Brachial systolic pressure:         102               98 Brachial Doppler waveforms:         WNL               WNL Vertebral direction of flow:        Antegrade         Antegrade DUPLEX VELOCITIES (cm/sec) CCA peak systolic                   81                63 ECA peak systolic                   115               97 ICA peak systolic                   73                [id number] ICA end diastolic                   21                95 PLAQUE MORPHOLOGY:                  Heterogeneous     Calcific PLAQUE AMOUNT:                      Mild              Moderate to severe PLAQUE LOCATION:                    CCA/ICA           CCA/ICA  IMPRESSION: 1. 1-39% right internal carotid artery stenosis. 2. 60-79% left internal carotid artery stenosis by velocity; however,     plaque is calcific and Doppler is unable to penetrate shadow.     Velocities are recorded just distal to the tightest area and may     under-represent stenosis. 3. Bilateral vertebral arteries are within normal limits.  ___________________________________________ Judeth Cornfield. Scot Dock, M.D.  LT/MEDQ  D:  08/03/2011  T:  08/03/2011  Job:  [id number]

## 2012-02-07 ENCOUNTER — Encounter: Payer: Self-pay | Admitting: Neurosurgery

## 2012-02-08 ENCOUNTER — Encounter: Payer: Self-pay | Admitting: Neurosurgery

## 2012-02-08 ENCOUNTER — Ambulatory Visit (INDEPENDENT_AMBULATORY_CARE_PROVIDER_SITE_OTHER): Payer: Medicare Other | Admitting: Neurosurgery

## 2012-02-08 ENCOUNTER — Other Ambulatory Visit (INDEPENDENT_AMBULATORY_CARE_PROVIDER_SITE_OTHER): Payer: Medicare Other | Admitting: *Deleted

## 2012-02-08 VITALS — BP 120/75 | HR 68 | Resp 16 | Ht 67.0 in | Wt 160.0 lb

## 2012-02-08 DIAGNOSIS — I6529 Occlusion and stenosis of unspecified carotid artery: Secondary | ICD-10-CM

## 2012-02-08 NOTE — Progress Notes (Signed)
[facility] [place] Carotid Office Note  CC: Carotid surveillance Referring Physician: Scot Dock  History of Present Illness: 79 year old male patient of Dr. Scot Dock followed for bilateral carotid stenosis. The patient denies any signs or symptoms of CVA, TIA, amaurosis fugax or any neural deficit.  Past Medical History  Diagnosis Date  . Diabetes mellitus   . Hyperlipidemia   . Hypertension   . Myocardial infarction     1998  . Arthritis   . Asthma   . Carotid artery occlusion   . CHF (congestive heart failure)   . Colon polyp   . COPD (chronic obstructive pulmonary disease)   . GERD (gastroesophageal reflux disease)   . CAD (coronary artery disease)     ROS: [x]  Positive   [ ]  Denies    General: [ ]  Weight loss, [ ]  Fever, [ ]  chills Neurologic: [ ]  Dizziness, [ ]  Blackouts, [ ]  Seizure [ ]  Stroke, [ ]  "Mini stroke", [ ]  Slurred speech, [ ]  Temporary blindness; [ ]  weakness in arms or legs, [ ]  Hoarseness Cardiac: [ ]  Chest pain/pressure, [ ]  Shortness of breath at rest [ ]  Shortness of breath with exertion, [ ]  Atrial fibrillation or irregular heartbeat Vascular: [ ]  Pain in legs with walking, [ ]  Pain in legs at rest, [ ]  Pain in legs at night,  [ ]  Non-healing ulcer, [ ]  Blood clot in vein/DVT,   Pulmonary: [ ]  Home oxygen, [ ]  Productive cough, [ ]  Coughing up blood, [ ]  Asthma,  [ ]  Wheezing Musculoskeletal:  [ ]  Arthritis, [ ]  Low back pain, [ ]  Joint pain Hematologic: [ ]  Easy Bruising, [ ]  Anemia; [ ]  Hepatitis Gastrointestinal: [ ]  Blood in stool, [ ]  Gastroesophageal Reflux/heartburn, [ ]  Trouble swallowing Urinary: [ ]  chronic Kidney disease, [ ]  on HD - [ ]  MWF or [ ]  TTHS, [ ]  Burning with urination, [ ]  Difficulty urinating Skin: [ ]  Rashes, [ ]  Wounds Psychological: [ ]  Anxiety, [ ]  Depression   Social History History  Substance Use Topics  . Smoking status: Never Smoker   . Smokeless tobacco: Not on file  . Alcohol Use: No     Family History Family History  Problem Relation Age of Onset  . Aneurysm Sister     Allergies  Allergen Reactions  . Niacin And Related Other (See Comments)    Burning     Current Outpatient Prescriptions  Medication Sig Dispense Refill  . aspirin EC 81 MG tablet Take 81 mg by mouth daily.        Marland Kitchen atorvastatin (LIPITOR) 80 MG tablet Take 80 mg by mouth daily.        . carbidopa-levodopa (SINEMET IR) 10-100 MG per tablet Take 10 mg by mouth Three times a day.      . dutasteride (AVODART) 0.5 MG capsule Take 0.5 mg by mouth daily.        Marland Kitchen ezetimibe (ZETIA) 10 MG tablet Take 10 mg by mouth daily.        . fexofenadine (ALLEGRA) 180 MG tablet Take 180 mg by mouth daily.        . lansoprazole (PREVACID) 30 MG capsule Take 30 mg by mouth 2 (two) times daily.        . metFORMIN (GLUCOPHAGE) 500 MG tablet Take 500 mg by mouth 2 (two) times daily with a meal.        . metoprolol (TOPROL-XL) 50 MG 24 hr tablet Take 50  mg by mouth daily.        . nabumetone (RELAFEN) 500 MG tablet Take 500 mg by mouth 2 (two) times daily.        Marland Kitchen NIFEdipine (PROCARDIA-XL/ADALAT CC) 60 MG 24 hr tablet Take 60 mg by mouth daily.        . Tamsulosin HCl (FLOMAX) 0.4 MG CAPS Take 0.4 mg by mouth daily.          Physical Examination  Filed Vitals:   02/08/12 1451  BP: 136/80  Pulse: 69  Resp: 16    Body mass index is 25.06 kg/(m^2).  General:  WDWN in NAD Gait: Normal HEENT: WNL Eyes: Pupils equal Pulmonary: normal non-labored breathing , without Rales, rhonchi,  wheezing Cardiac: RRR, without  Murmurs, rubs or gallops; Abdomen: soft, NT, no masses Skin: no rashes, ulcers noted  Vascular Exam Pulses: 2+ radial pulses bilaterally Carotid bruits: Carotid pulse on the right with a mild bruit on the left Extremities without ischemic changes, no Gangrene , no cellulitis; no open wounds;  Musculoskeletal: no muscle wasting or atrophy   Neurologic: A&O X 3; Appropriate Affect ; SENSATION:  normal; MOTOR FUNCTION:  moving all extremities equally. Speech is fluent/normal  Non-Invasive Vascular Imaging CAROTID DUPLEX 02/08/2012  Right ICA 20 - 39 % stenosis Left ICA 60 - 79 % stenosis   ASSESSMENT/PLAN: Asymptomatic patient with stable carotid duplex compared to July 2013. The patient will followup in 6 months with repeat carotid duplex. The patient's questions were encouraged and answered, he is in agreement with this plan.  Beatris Ship ANP   Clinic MD: Scot Dock

## 2012-02-09 NOTE — Addendum Note (Signed)
Addended by: Mena Goes on: 02/09/2012 08:42 AM   Modules accepted: Orders

## 2012-08-08 ENCOUNTER — Other Ambulatory Visit (INDEPENDENT_AMBULATORY_CARE_PROVIDER_SITE_OTHER): Payer: Medicare Other | Admitting: *Deleted

## 2012-08-08 ENCOUNTER — Ambulatory Visit: Payer: Medicare Other | Admitting: Neurosurgery

## 2012-08-08 DIAGNOSIS — I6529 Occlusion and stenosis of unspecified carotid artery: Secondary | ICD-10-CM

## 2012-08-09 ENCOUNTER — Other Ambulatory Visit: Payer: Self-pay | Admitting: *Deleted

## 2012-08-13 ENCOUNTER — Encounter: Payer: Self-pay | Admitting: Vascular Surgery

## 2013-02-13 ENCOUNTER — Other Ambulatory Visit (HOSPITAL_COMMUNITY): Payer: Medicare Other

## 2013-02-13 ENCOUNTER — Ambulatory Visit: Payer: Medicare Other | Admitting: Vascular Surgery

## 2015-02-02 DIAGNOSIS — H2511 Age-related nuclear cataract, right eye: Secondary | ICD-10-CM | POA: Diagnosis not present

## 2015-02-02 DIAGNOSIS — H21561 Pupillary abnormality, right eye: Secondary | ICD-10-CM | POA: Diagnosis not present

## 2015-02-02 DIAGNOSIS — H5703 Miosis: Secondary | ICD-10-CM | POA: Diagnosis not present

## 2015-02-18 ENCOUNTER — Ambulatory Visit: Payer: Medicare Other | Admitting: Neurology

## 2015-02-18 ENCOUNTER — Telehealth: Payer: Self-pay

## 2015-02-18 NOTE — Telephone Encounter (Signed)
I spoke to [facility] and advised her that doctor is out sick. We were able to reschedule patient's appt.

## 2015-03-03 ENCOUNTER — Encounter: Payer: Self-pay | Admitting: Neurology

## 2015-03-03 ENCOUNTER — Encounter (INDEPENDENT_AMBULATORY_CARE_PROVIDER_SITE_OTHER): Payer: Self-pay

## 2015-03-03 ENCOUNTER — Ambulatory Visit (INDEPENDENT_AMBULATORY_CARE_PROVIDER_SITE_OTHER): Payer: Medicare Other | Admitting: Neurology

## 2015-03-03 VITALS — BP 156/93 | HR 73 | Resp 16 | Ht 67.0 in | Wt 165.0 lb

## 2015-03-03 DIAGNOSIS — G20 Parkinson's disease: Secondary | ICD-10-CM | POA: Diagnosis not present

## 2015-03-03 MED ORDER — CARBIDOPA-LEVODOPA ER 25-100 MG PO TBCR: 1.0000 | EXTENDED_RELEASE_TABLET | Freq: Three times a day (TID) | ORAL | Status: DC

## 2015-03-03 NOTE — Patient Instructions (Signed)
You may have mild right sided parkinson's.  We will switch to a long acting Sinemet, called Sinemet CR, take 1 pill 3 times a day about 4 hours apart.   Drink enough water, about 6 glasses.

## 2015-03-03 NOTE — Progress Notes (Signed)
Subjective:    Patient ID: Victor Bell is a 82 y.o. male.  HPI     Star Age, MD, PhD [facility] [address], [address] [place], [place] [address]  Dear Rennis Petty,   I saw your patient, Victor Bell, upon your kind request, in my neurologic clinic today, for initial consultation of suspected parkinsonism. The patient is accompanied by is daughter today. As you know, [name] is a 82 year old left-handed gentleman with an underlying medical history of diabetes, hyperlipidemia, prostate hypertrophy, hypertension, and chronic lung disease who was previously diagnosed with Parkinson's disease. He has seen Dr. Brandon Melnick. Symptoms date back to 6 years ago with mild progression noted. He started noticing a right hand tremor at rest about 6 years ago. Symptoms have progressed very mildly over the course of time.  He has been on Sinemet. He is currently on 25-100 milligrams strength one pill 3 to 4 times a day. He has mild constipation which is manageable. He does not like to drink water and drinks perhaps one or 2 cups a day. He likes to drink coffee in the morning. He lives with his 75 year old wife, they have 2 grown children, both in the area. Daughter checks on them every day. He has a son who lives about 10 miles away. He worked for [employer]. Thankfully he has not fallen recently. He has a cane and a walker but does not use his walker very much. Sometimes he uses a cane inside the house. He feels that the Sinemet has been helpful. He does not always remember the midday dose and usually averages 2-3 pills a day. It is written for 4 times a day but he does not typically take it 4 times a day. He feels that it lasts about 3 maybe 4 hours in between. He does not report any significant side effects. In particular, he denies any nausea, headache, hallucinations, he has had some blood pressure decrease with time. He has problems sleeping at night at times and is  somewhat sleepy during the day and dozes off involuntarily. He has been driving but has limited his driving to daytime driving and local roads only.  He has not noticed any involuntary movements such as we would call dyskinesias. His neurologist moved away. I reviewed your office note from 01/22/2015, which you kindly included.  His Past Medical History Is Significant For: Past Medical History  Diagnosis Date  . Diabetes mellitus   . Hyperlipidemia   . Hypertension   . Myocardial infarction ([place])     1998  . Arthritis   . Asthma   . Carotid artery occlusion   . CHF (congestive heart failure) ([place])   . Colon polyp   . COPD (chronic obstructive pulmonary disease) ([place])   . GERD (gastroesophageal reflux disease)   . CAD (coronary artery disease)   . Heart disease   . Cancer [facility])     skin     His Past Surgical History Is Significant For: Past Surgical History  Procedure Laterality Date  . Cholecystectomy      Gall bladder  . Tonsillectomy    . Coronary artery bypass graft  10/03/96    x7  . Cataract extraction      His Family History Is Significant For: Family History  Problem Relation Age of Onset  . Aneurysm Sister   . Heart disease Mother     His Social History Is Significant For: Social History   Social History  .  Marital Status: Married    Spouse Name: N/A  . Number of Children: 1  . Years of Education: 11   Occupational History  . Retired    Social History Main Topics  . Smoking status: Never Smoker   . Smokeless tobacco: Never Used  . Alcohol Use: No  . Drug Use: No  . Sexual Activity: Not Asked   Other Topics Concern  . None   Social History Narrative   Drinks coffee in the morning     His Allergies Are:  Allergies  Allergen Reactions  . Niacin And Related Other (See Comments)    Burning   :   His Current Medications Are:  Outpatient Encounter Prescriptions as of 03/03/2015  Medication Sig  . aspirin EC 81 MG tablet Take 81 mg by mouth  daily.    Marland Kitchen atorvastatin (LIPITOR) 80 MG tablet Take 80 mg by mouth daily.    . benazepril (LOTENSIN) 5 MG tablet   . carbidopa-levodopa (SINEMET IR) 25-100 MG tablet 1 tablet 3 (three) times daily.   Marland Kitchen dutasteride (AVODART) 0.5 MG capsule Take 0.5 mg by mouth daily.    Marland Kitchen ezetimibe (ZETIA) 10 MG tablet Take 10 mg by mouth daily.    . finasteride (PROSCAR) 5 MG tablet   . ipratropium (ATROVENT) 0.06 % nasal spray   . metFORMIN (GLUCOPHAGE) 500 MG tablet Take 500 mg by mouth 2 (two) times daily with a meal.    . metoprolol (TOPROL-XL) 50 MG 24 hr tablet Take 50 mg by mouth daily.    . nabumetone (RELAFEN) 500 MG tablet Take 500 mg by mouth 2 (two) times daily.    Marland Kitchen omeprazole (PRILOSEC) 40 MG capsule   . polyethylene glycol powder (GLYCOLAX/MIRALAX) powder   . Tamsulosin HCl (FLOMAX) 0.4 MG CAPS Take 0.4 mg by mouth daily.    . traMADol (ULTRAM) 50 MG tablet    No facility-administered encounter medications on file as of 03/03/2015.  : Review of Systems:  Out of a complete 14 point review of systems, all are reviewed and negative with the exception of these symptoms as listed below:   Review of Systems  Constitutional: Positive for fatigue.  Respiratory: Positive for cough.   Allergic/Immunologic: Positive for environmental allergies.  Neurological: Positive for tremors.       Patient reports tremors in R hand, sometimes the whole L arm. Some trouble with walking. Last saw neurologist in [place] 5 years ago.  Insomnia, sleepiness, restless legs.     Objective:  Neurologic Exam  Physical Exam Physical Examination:   Filed Vitals:   03/03/15 1119  BP: 156/93  Pulse: 73  Resp: 16    General Examination: The patient is a very pleasant 82 y.o. male in no acute distress.  HEENT: Normocephalic, atraumatic, pupils are equal, round and reactive to light and accommodation. Funduscopic exam is normal with sharp disc margins noted. Extraocular tracking shows mild saccadic breakdown without  nystagmus noted. There is limitation to upper gaze. There is mild decrease in eye blink rate. Hearing is impaired with bilateral hearing aids in place. Face is symmetric with mild facial masking and normal facial sensation. There is no lip, neck or jaw tremor. Neck is mildly rigid with intact passive ROM. There are no carotid bruits on auscultation. Oropharynx exam reveals moderate mouth dryness. No significant airway crowding is noted. Mallampati is class II. Tongue protrudes centrally and palate elevates symmetrically.   There is no drooling.   Chest: is clear to auscultation without  wheezing, rhonchi or crackles noted.  Heart: sounds are regular and normal without murmurs, rubs or gallops noted.   Abdomen: is soft, non-tender and non-distended with normal bowel sounds appreciated on auscultation.  Extremities: There is no pitting edema in the distal lower extremities bilaterally. Pedal pulses are intact.   Skin: is warm and dry with no trophic changes noted. Age-related changes are noted on the skin.   Musculoskeletal: exam reveals no obvious joint deformities, tenderness, joint swelling or erythema.  Neurologically:  Mental status: The patient is awake and alert, paying good  attention. He is able to provide his history quite well. His daughter provides details. He is oriented to time, place, circumstance and person. His memory, attention, language and knowledge are fairly well-preserved. There is no aphasia, agnosia, apraxia or anomia. There is a mild degree of bradyphrenia. Speech is mildly hypophonic with no dysarthria noted. Mood is congruent and affect is normal. Cranial nerves are as described above under HEENT exam. In addition, shoulder shrug is normal with equal shoulder height noted.  Motor exam: Normal bulk, and strength for age is noted. There are no dyskinesias noted. Tone is very mildly increased in the right upper extremity. He has a mild intermittent resting tremor in the right  upper extremity only. He has no significant resting tremor elsewhere. Fine motor skills with finger taps and hand movements as well as rapid alternating patting are mildly impaired on the right and minimally so on the left. Foot agility is mildly impaired on the right and minimally impaired on the left. He stands with mild difficulty and pushes himself up. Posture is mildly stooped, could be age-appropriate. He walks with a very slight decrease in arm swing on the right. He turns slightly insecurely. He has a tendency to turn too fast Cerebellar testing shows no dysmetria or intention tremor on finger to nose testing. Heel to shin is unremarkable bilaterally. There is no truncal or gait ataxia.   Sensory exam is intact to light touch, pinprick, vibration, temperature sense in the upper and lower extremities.   Assessment and Plan:   In summary, Jackhenry Jaroszewski is a very pleasant 82 y.o.-year old male  with an underlying medical history of diabetes, hyperlipidemia, prostate hypertrophy, hypertension, and chronic lung disease who was previously diagnosed with Parkinson's disease. His symptoms date back to about 6 years ago. On examination, he does have very mild signs of right-sided parkinsonism, could be idiopathic right-sided Parkinson's disease with mild findings and mild progression with time. He has been on Sinemet. I suggested we continue with levodopa therapy. He would like to try the long-acting as he does not always remember 3 of 4 doses per day. It is hard to keep a schedule. He is advised to try Sinemet CR 25-100 milligrams strength one tablet 3 times a day about 4 hourly. He is advised to drink more water and exercise regularly, consider a cane for safety and he is reminded to turn slowly. He is also reminded to change positions slowly. I talked to the patient and his daughter at length today. I suggested we monitor his driving and that he limit his driving to day light hours only and local roads  only. He is agreeable to this. He and his daughter are reassured that findings are generally speaking quite mild. We will continue to monitor. I will see him back in about 3 months, sooner if needed. I answered all the questions today and they were in agreement.  Thank you very much  for allowing me to participate in the care of this nice patient. If I can be of any further assistance to you please do not hesitate to call me at [phone].  Sincerely,   Star Age, MD, PhD

## 2015-03-17 DIAGNOSIS — E1165 Type 2 diabetes mellitus with hyperglycemia: Secondary | ICD-10-CM | POA: Diagnosis not present

## 2015-03-17 DIAGNOSIS — I10 Essential (primary) hypertension: Secondary | ICD-10-CM | POA: Diagnosis not present

## 2015-03-17 DIAGNOSIS — E7800 Pure hypercholesterolemia, unspecified: Secondary | ICD-10-CM | POA: Diagnosis not present

## 2015-03-17 DIAGNOSIS — N400 Enlarged prostate without lower urinary tract symptoms: Secondary | ICD-10-CM | POA: Diagnosis not present

## 2015-04-02 DIAGNOSIS — H578 Other specified disorders of eye and adnexa: Secondary | ICD-10-CM | POA: Diagnosis not present

## 2015-04-02 DIAGNOSIS — R29810 Facial weakness: Secondary | ICD-10-CM | POA: Diagnosis not present

## 2015-04-24 DIAGNOSIS — I10 Essential (primary) hypertension: Secondary | ICD-10-CM | POA: Diagnosis not present

## 2015-04-24 DIAGNOSIS — G20 Parkinson's disease: Secondary | ICD-10-CM | POA: Diagnosis not present

## 2015-04-24 DIAGNOSIS — E1165 Type 2 diabetes mellitus with hyperglycemia: Secondary | ICD-10-CM | POA: Diagnosis not present

## 2015-06-01 ENCOUNTER — Encounter: Payer: Self-pay | Admitting: Neurology

## 2015-06-01 ENCOUNTER — Ambulatory Visit (INDEPENDENT_AMBULATORY_CARE_PROVIDER_SITE_OTHER): Payer: Medicare Other | Admitting: Neurology

## 2015-06-01 VITALS — BP 138/76 | HR 78 | Resp 16 | Ht 67.0 in | Wt 155.0 lb

## 2015-06-01 DIAGNOSIS — G20 Parkinson's disease: Secondary | ICD-10-CM | POA: Diagnosis not present

## 2015-06-01 NOTE — Progress Notes (Signed)
Subjective:    Patient ID: Jasmin Trumbull is a 82 y.o. male.  HPI     Interim history:   Mr. Biederman is a 82 year old left-handed gentleman with an underlying medical history of diabetes, hyperlipidemia, prostate hypertrophy, hypertension, and chronic lung disease who presents for follow up consultation of his parkinsonism, possibly right-sided predominant Parkinson's disease of about 6 years duration. The patient is accompanied by his daughter again today. I first met him on 03/03/2015 at the request of his primary care physician, at which time he reported a prior diagnosis of Parkinson's disease and he is to follow with a neurologist moved away. He was on Sinemet. He was not always remembering his medication. We switched his Sinemet to Sinemet CR 25-100 milligrams strength one tablet 3 times a day about 4 hourly dosing.  Today, 06/01/2015: He reports not feeling much in the way of difference on the long acting vs. the IR C/L. He does not always remember to take the 3 pills a day, sometimes in midday he takes the IR. He had an increase in the metformin. He has lost about 10 lb in the last 3 months. He feels that his tremor is getting worse. He has occasional lightheadedness upon standing quickly. He cannot exercise very much because of lumbar spinal stenosis and lower back pain, sometimes radiating to the left. He has not had any recent falls, thankfully.  Previously:  03/03/2015: He was previously diagnosed with Parkinson's disease. He has seen Dr. Brandon Melnick. Symptoms date back to 6 years ago with mild progression noted. He started noticing a right hand tremor at rest about 6 years ago. Symptoms have progressed very mildly over the course of time. He has been on C/L for about 4 years.   He has been on Sinemet. He is currently on 25-100 milligrams strength one pill 3 to 4 times a day. He has mild constipation which is manageable. He does not like to drink water and drinks perhaps one or 2 cups a  day. He likes to drink coffee in the morning. He lives with his 71 year old wife, they have 2 grown children, both in the area. Daughter checks on them every day. He has a son who lives about 10 miles away. He worked for [employer]. Thankfully he has not fallen recently. He has a cane and a walker but does not use his walker very much. Sometimes he uses a cane inside the house. He feels that the Sinemet has been helpful. He does not always remember the midday dose and usually averages 2-3 pills a day. It is written for 4 times a day but he does not typically take it 4 times a day. He feels that it lasts about 3 maybe 4 hours in between. He does not report any significant side effects. In particular, he denies any nausea, headache, hallucinations, he has had some blood pressure decrease with time. He has problems sleeping at night at times and is somewhat sleepy during the day and dozes off involuntarily. He has been driving but has limited his driving to daytime driving and local roads only.   He has not noticed any involuntary movements such as we would call dyskinesias. His neurologist moved away. I reviewed your office note from 01/22/2015, which you kindly included.  His Past Medical History Is Significant For: Past Medical History  Diagnosis Date  . Diabetes mellitus   . Hyperlipidemia   . Hypertension   . Myocardial infarction ([place])     1998  .  Arthritis   . Asthma   . Carotid artery occlusion   . CHF (congestive heart failure) ([place])   . Colon polyp   . COPD (chronic obstructive pulmonary disease) ([place])   . GERD (gastroesophageal reflux disease)   . CAD (coronary artery disease)   . Heart disease   . Cancer [facility])     skin     His Past Surgical History Is Significant For: Past Surgical History  Procedure Laterality Date  . Cholecystectomy      Gall bladder  . Tonsillectomy    . Coronary artery bypass graft  10/03/96    x7  . Cataract extraction      His Family History Is  Significant For: Family History  Problem Relation Age of Onset  . Aneurysm Sister   . Heart disease Mother     His Social History Is Significant For: Social History   Social History  . Marital Status: Married    Spouse Name: N/A  . Number of Children: 1  . Years of Education: 11   Occupational History  . Retired    Social History Main Topics  . Smoking status: Never Smoker   . Smokeless tobacco: Never Used  . Alcohol Use: No  . Drug Use: No  . Sexual Activity: Not Asked   Other Topics Concern  . None   Social History Narrative   Drinks coffee in the morning     His Allergies Are:  Allergies  Allergen Reactions  . Niacin And Related Other (See Comments)    Burning   :   His Current Medications Are:  Outpatient Encounter Prescriptions as of 06/01/2015  Medication Sig  . aspirin EC 81 MG tablet Take 81 mg by mouth daily.    Marland Kitchen atorvastatin (LIPITOR) 80 MG tablet Take 80 mg by mouth daily.    . benazepril (LOTENSIN) 5 MG tablet   . Carbidopa-Levodopa ER (SINEMET CR) 25-100 MG tablet controlled release Take 1 tablet by mouth 3 (three) times daily.  Marland Kitchen dutasteride (AVODART) 0.5 MG capsule Take 0.5 mg by mouth daily.    Marland Kitchen ezetimibe (ZETIA) 10 MG tablet Take 10 mg by mouth daily.    . finasteride (PROSCAR) 5 MG tablet   . ipratropium (ATROVENT) 0.06 % nasal spray   . metFORMIN (GLUCOPHAGE) 500 MG tablet Take 500 mg by mouth 2 (two) times daily with a meal.    . metoprolol (TOPROL-XL) 50 MG 24 hr tablet Take 50 mg by mouth daily.    . nabumetone (RELAFEN) 500 MG tablet Take 500 mg by mouth 2 (two) times daily.    Marland Kitchen omeprazole (PRILOSEC) 40 MG capsule   . polyethylene glycol powder (GLYCOLAX/MIRALAX) powder   . Tamsulosin HCl (FLOMAX) 0.4 MG CAPS Take 0.4 mg by mouth daily.    . traMADol (ULTRAM) 50 MG tablet    No facility-administered encounter medications on file as of 06/01/2015.  :  Review of Systems:  Out of a complete 14 point review of systems, all are reviewed  and negative with the exception of these symptoms as listed below:   Review of Systems  Neurological:       Patient would like to discuss his tremors.     Objective:  Neurologic Exam  Physical Exam Physical Examination:   Filed Vitals:   06/01/15 1256  BP: 138/76  Pulse: 78  Resp: 16    General Examination: The patient is a very pleasant 82 y.o. male in no acute distress.  HEENT: Normocephalic,  atraumatic, pupils are equal, round and reactive to light and accommodation. Funduscopic exam is normal with sharp disc margins noted. Extraocular tracking shows mild saccadic breakdown without nystagmus noted. There is limitation to upper gaze. There is mild decrease in eye blink rate. Hearing is impaired with bilateral hearing aids in place. Face is symmetric with mild facial masking and normal facial sensation. There is no lip, neck or jaw tremor. Neck is mildly rigid with intact passive ROM. There are no carotid bruits on auscultation. Oropharynx exam reveals moderate mouth dryness. No significant airway crowding is noted. Mallampati is class II. Tongue protrudes centrally and palate elevates symmetrically.  there is no significant sialorrhea.  Chest: is clear to auscultation without wheezing, rhonchi or crackles noted.  Heart: sounds are regular and normal without murmurs, rubs or gallops noted.   Abdomen: is soft, non-tender and non-distended with normal bowel sounds appreciated on auscultation.  Extremities: There is no pitting edema in the distal lower extremities bilaterally. Pedal pulses are intact.   Skin: is warm and dry with no trophic changes noted. Age-related changes are noted on the skin.   Musculoskeletal: exam reveals no obvious joint deformities, tenderness, joint swelling or erythema.  Neurologically:  Mental status: The patient is awake and alert, paying good  attention. He is able to provide his history quite well. His daughter provides details. He is oriented to  time, place, circumstance and person. His memory, attention, language and knowledge are fairly well-preserved. There is no aphasia, agnosia, apraxia or anomia. There is a mild degree of bradyphrenia. Speech is mildly hypophonic with no dysarthria noted. Mood is congruent and affect is normal. Cranial nerves are as described above under HEENT exam. In addition, shoulder shrug is normal with equal shoulder height noted.  Motor exam: Normal bulk, and strength for age is noted. There are no dyskinesias noted. Tone is very mildly increased in the right upper extremity. He has a mild intermittent resting tremor in the right upper extremity only. He has no significant resting tremor elsewhere. Fine motor skills with finger taps and hand movements as well as rapid alternating patting are mildly impaired on the right and minimally so on the left. Foot agility is mildly impaired on the right and minimally impaired on the left. He stands with mild difficulty and pushes himself up. Posture is mildly stooped, could be age-appropriate. He walks with a very slight decrease in arm swing on the right. He turns slightly insecurely. He has a tendency to turn too fast, tandem walk is not possible.  Cerebellar testing shows no dysmetria or intention tremor on finger to nose testing. Heel to shin is unremarkable bilaterally. There is no truncal or gait ataxia.   Sensory exam is intact to light touch, pinprick, vibration, temperature sense in the upper and lower extremities. Reflexes are 1+ in the upper extremities, trace in the lower extremities.   Assessment and Plan:   In summary, Judah Carchi is a very pleasant 82 year old male  with an underlying medical history of diabetes, hyperlipidemia, prostate hypertrophy, hypertension, and chronic lung disease who was previously diagnosed with Parkinson's disease, with symptoms dating back to about 6 years ago. On examination, he has mild signs of right-sided parkinsonism, likely  idiopathic right-sided Parkinson's disease with mild findings and mild progression with time. He has been on Sinemet, but does not feel that he had a significant response to it. Switching to the long-acting did not help very much, in fact, he feels that he is worse.  I did advise him that switching to long-acting Sinemet can sometimes cause less medication to be absorbed and overall medication dosing is not the same as with the IR. The biggest challenge is to have him on a schedule. He has a tendency to forget his midday dose. None of these medications will last long enough to be taken only twice daily. I explained this to him I suggested that we have several different options, we can increase the medication dose for each dose, we can increase the frequency to 4 times a day which will of course be even more difficult for him to maintain a schedule, and ultimately, we can also try some other medication but a dopamine agonist is more likely to give him side effects. He is advised to try to take one Sinemet CR and 1 Sinemet IR together 3 times a day for a total of 6 pills. His daughter will try to get him a pillbox that we will allow for 3 doses a day and fill his box for him 1 week at a time. We will try this for a couple weeks. She is encouraged to call or email with an update after a couple of weeks. She did not need any prescriptions today as she has enough of this ER and IR at home. He is advised to drink more water and exercise regularly, consider a cane for safety and he is reminded to turn slowly. He is also reminded to change positions slowly. He is limited in his exercise capability but is encouraged to walk as much is possible without having severe pain. We have previously talked about his driving and we mutually agreed to limit his driving to daylight hours and local roads openly. His daughter checks on him every day, son lives close by, unfortunately, his wife has memory loss. I would like to see him back  in 4 months, sooner as needed, I will be on the look out for a message from his daughter and her feet weeks. I answered all their questions today and the patient and his daughter were in agreement.  I spent 25 minutes in total face-to-face time with the patient, more than 50% of which was spent in counseling and coordination of care, reviewing test results, reviewing medication and discussing or reviewing the diagnosis of PD, its prognosis and treatment options.

## 2015-06-01 NOTE — Patient Instructions (Addendum)
We will change your medication regimen to: 1 pill of the long acting and 1 pill of the immediate release Sinemet 3 times a day. (that's a total of 6 pills a day).   Please try to take the medication away from you mealtimes, that is, ideally either one hour before or 2 hours after your meal to ensure optimal absorption. The medication can interfere with the protein content of your meal and trying to the protein in your food and therefore not get fully absorbed.  Common side effects reported are: Nausea, vomiting, sedation, confusion, lightheadedness. Rare side effects include hallucinations, severe nausea or vomiting, diarrhea and significant drop in blood pressure especially when going from lying to standing or from sitting to standing.

## 2015-06-12 ENCOUNTER — Encounter: Payer: Self-pay | Admitting: Neurology

## 2015-06-16 DIAGNOSIS — G20 Parkinson's disease: Secondary | ICD-10-CM | POA: Diagnosis not present

## 2015-06-16 DIAGNOSIS — Z955 Presence of coronary angioplasty implant and graft: Secondary | ICD-10-CM | POA: Diagnosis not present

## 2015-06-16 DIAGNOSIS — E785 Hyperlipidemia, unspecified: Secondary | ICD-10-CM | POA: Diagnosis not present

## 2015-06-16 DIAGNOSIS — E119 Type 2 diabetes mellitus without complications: Secondary | ICD-10-CM | POA: Diagnosis not present

## 2015-06-16 DIAGNOSIS — I119 Hypertensive heart disease without heart failure: Secondary | ICD-10-CM | POA: Diagnosis not present

## 2015-06-16 DIAGNOSIS — Z951 Presence of aortocoronary bypass graft: Secondary | ICD-10-CM | POA: Diagnosis not present

## 2015-06-16 DIAGNOSIS — K219 Gastro-esophageal reflux disease without esophagitis: Secondary | ICD-10-CM | POA: Diagnosis not present

## 2015-06-16 DIAGNOSIS — I2510 Atherosclerotic heart disease of native coronary artery without angina pectoris: Secondary | ICD-10-CM | POA: Diagnosis not present

## 2015-06-29 DIAGNOSIS — Z1283 Encounter for screening for malignant neoplasm of skin: Secondary | ICD-10-CM | POA: Diagnosis not present

## 2015-06-29 DIAGNOSIS — L570 Actinic keratosis: Secondary | ICD-10-CM | POA: Diagnosis not present

## 2015-06-29 DIAGNOSIS — Z85820 Personal history of malignant melanoma of skin: Secondary | ICD-10-CM | POA: Diagnosis not present

## 2015-06-29 DIAGNOSIS — X32XXXD Exposure to sunlight, subsequent encounter: Secondary | ICD-10-CM | POA: Diagnosis not present

## 2015-06-29 DIAGNOSIS — Z08 Encounter for follow-up examination after completed treatment for malignant neoplasm: Secondary | ICD-10-CM | POA: Diagnosis not present

## 2015-06-29 DIAGNOSIS — L821 Other seborrheic keratosis: Secondary | ICD-10-CM | POA: Diagnosis not present

## 2015-06-30 DIAGNOSIS — I2510 Atherosclerotic heart disease of native coronary artery without angina pectoris: Secondary | ICD-10-CM | POA: Diagnosis not present

## 2015-06-30 DIAGNOSIS — J45909 Unspecified asthma, uncomplicated: Secondary | ICD-10-CM | POA: Diagnosis not present

## 2015-06-30 DIAGNOSIS — E119 Type 2 diabetes mellitus without complications: Secondary | ICD-10-CM | POA: Diagnosis not present

## 2015-06-30 DIAGNOSIS — I10 Essential (primary) hypertension: Secondary | ICD-10-CM | POA: Diagnosis not present

## 2015-07-31 DIAGNOSIS — Z789 Other specified health status: Secondary | ICD-10-CM | POA: Diagnosis not present

## 2015-07-31 DIAGNOSIS — E1165 Type 2 diabetes mellitus with hyperglycemia: Secondary | ICD-10-CM | POA: Diagnosis not present

## 2015-07-31 DIAGNOSIS — M1990 Unspecified osteoarthritis, unspecified site: Secondary | ICD-10-CM | POA: Diagnosis not present

## 2015-07-31 DIAGNOSIS — Z6825 Body mass index (BMI) 25.0-25.9, adult: Secondary | ICD-10-CM | POA: Diagnosis not present

## 2015-07-31 DIAGNOSIS — I10 Essential (primary) hypertension: Secondary | ICD-10-CM | POA: Diagnosis not present

## 2015-07-31 DIAGNOSIS — Z299 Encounter for prophylactic measures, unspecified: Secondary | ICD-10-CM | POA: Diagnosis not present

## 2015-08-05 DIAGNOSIS — I2510 Atherosclerotic heart disease of native coronary artery without angina pectoris: Secondary | ICD-10-CM | POA: Diagnosis not present

## 2015-08-05 DIAGNOSIS — J45909 Unspecified asthma, uncomplicated: Secondary | ICD-10-CM | POA: Diagnosis not present

## 2015-08-05 DIAGNOSIS — I10 Essential (primary) hypertension: Secondary | ICD-10-CM | POA: Diagnosis not present

## 2015-08-05 DIAGNOSIS — E119 Type 2 diabetes mellitus without complications: Secondary | ICD-10-CM | POA: Diagnosis not present

## 2015-10-02 DIAGNOSIS — M461 Sacroiliitis, not elsewhere classified: Secondary | ICD-10-CM | POA: Diagnosis not present

## 2015-10-02 DIAGNOSIS — E1165 Type 2 diabetes mellitus with hyperglycemia: Secondary | ICD-10-CM | POA: Diagnosis not present

## 2015-10-02 DIAGNOSIS — G20 Parkinson's disease: Secondary | ICD-10-CM | POA: Diagnosis not present

## 2015-10-02 DIAGNOSIS — Z299 Encounter for prophylactic measures, unspecified: Secondary | ICD-10-CM | POA: Diagnosis not present

## 2015-10-04 ENCOUNTER — Encounter: Payer: Self-pay | Admitting: Neurology

## 2015-10-06 ENCOUNTER — Ambulatory Visit: Payer: Medicare Other | Admitting: Neurology

## 2015-11-10 DIAGNOSIS — Z1389 Encounter for screening for other disorder: Secondary | ICD-10-CM | POA: Diagnosis not present

## 2015-11-10 DIAGNOSIS — Z7189 Other specified counseling: Secondary | ICD-10-CM | POA: Diagnosis not present

## 2015-11-10 DIAGNOSIS — Z0000 Encounter for general adult medical examination without abnormal findings: Secondary | ICD-10-CM | POA: Diagnosis not present

## 2015-11-10 DIAGNOSIS — Z79899 Other long term (current) drug therapy: Secondary | ICD-10-CM | POA: Diagnosis not present

## 2015-11-10 DIAGNOSIS — E1165 Type 2 diabetes mellitus with hyperglycemia: Secondary | ICD-10-CM | POA: Diagnosis not present

## 2015-11-10 DIAGNOSIS — Z1211 Encounter for screening for malignant neoplasm of colon: Secondary | ICD-10-CM | POA: Diagnosis not present

## 2015-11-10 DIAGNOSIS — E7800 Pure hypercholesterolemia, unspecified: Secondary | ICD-10-CM | POA: Diagnosis not present

## 2015-11-10 DIAGNOSIS — Z299 Encounter for prophylactic measures, unspecified: Secondary | ICD-10-CM | POA: Diagnosis not present

## 2015-11-10 DIAGNOSIS — Z125 Encounter for screening for malignant neoplasm of prostate: Secondary | ICD-10-CM | POA: Diagnosis not present

## 2015-11-10 DIAGNOSIS — R5383 Other fatigue: Secondary | ICD-10-CM | POA: Diagnosis not present

## 2015-11-16 DIAGNOSIS — Z23 Encounter for immunization: Secondary | ICD-10-CM | POA: Diagnosis not present

## 2015-11-17 ENCOUNTER — Ambulatory Visit (INDEPENDENT_AMBULATORY_CARE_PROVIDER_SITE_OTHER): Payer: Medicare Other | Admitting: Neurology

## 2015-11-17 ENCOUNTER — Encounter: Payer: Self-pay | Admitting: Neurology

## 2015-11-17 VITALS — BP 128/72 | HR 76 | Resp 14 | Ht 67.0 in | Wt 155.0 lb

## 2015-11-17 DIAGNOSIS — G2581 Restless legs syndrome: Secondary | ICD-10-CM | POA: Diagnosis not present

## 2015-11-17 DIAGNOSIS — G4761 Periodic limb movement disorder: Secondary | ICD-10-CM

## 2015-11-17 DIAGNOSIS — G20 Parkinson's disease: Secondary | ICD-10-CM | POA: Diagnosis not present

## 2015-11-17 MED ORDER — GABAPENTIN 100 MG PO CAPS: 100.0000 mg | ORAL_CAPSULE | Freq: Every day | ORAL | 5 refills | Status: DC

## 2015-11-17 NOTE — Patient Instructions (Addendum)
You can take Allegra, or Claritin or Zyrtec, all ONLY without the "D".  For cough as needed you can take Mucinex.  Avoid Klonopin or clonazepam or related drugs! You have classic symptoms of RLS or restless leg syndrome. We will at this time consider a prescription medication to help with restless legs symptoms and your sleep: Start Neurontin (gabapentin) 100 mg strength: Take 1 pill nightly at bedtime. The most common side effects reported are sedation or sleepiness. Rare side effects include balance problems, confusion.

## 2015-11-17 NOTE — Progress Notes (Signed)
Subjective:    Patient ID: Victor Bell is a 82 y.o. male.  HPI    Interim history:   Victor Bell is a 82 year old left-handed gentleman with an underlying medical history of diabetes, hyperlipidemia, prostate hypertrophy, hypertension, and chronic lung disease who presents for follow up consultation of his parkinsonism, possibly right-sided predominant Parkinson's disease of about 6 years' duration. The patient is accompanied by his daughter again today. Of note, they had to cancel an appointment for 10/06/2015. I last saw him on 06/01/2015, at which time he reported not feeling much in the way of difference on the long acting vs. the IR C/L. He would not always remember to take the 3 pills a day, sometimes in midday he would take the IR. He had an increase in the metformin. He lost about 10 lb in 3 months. He felt that his tremor was getting worse. He had occasional lightheadedness upon standing quickly. He cannot exercise very much because of lumbar spinal stenosis and lower back pain, sometimes radiating to the left. He had no recent falls. I suggested he take 1 pill of immediate release Sinemet along with one CR 3 times a day for a total of 6 pills a day.  Today, 11/17/2015: He reports doing okay. Has back pain, chronic, had ESI. Has leg pains and a Hx of RLS. Has a longstanding Hx of allergies, Went through allergy shots decades ago, has tried over-the-counter allergy medications, needs to know which one he can take, also has postnasal drip and cough. His biggest complaint are RLS symptoms and leg twitching at night. For back pain he takes tramadol as needed, not daily.   Previously:  I first met him on 03/03/2015 at the request of his primary care physician, at which time he reported a prior diagnosis of Parkinson's disease and he is to follow with a neurologist moved away. He was on Sinemet. He was not always remembering his medication. We switched his Sinemet to Sinemet CR 25-100  milligrams strength one tablet 3 times a day about 4 hourly dosing.   03/03/2015: He was previously diagnosed with Parkinson's disease. He has seen Dr. Brandon Melnick. Symptoms date back to 6 years ago with mild progression noted. He started noticing a right hand tremor at rest about 6 years ago. Symptoms have progressed very mildly over the course of time. He has been on C/L for about 4 years.    He has been on Sinemet. He is currently on 25-100 milligrams strength one pill 3 to 4 times a day. He has mild constipation which is manageable. He does not like to drink water and drinks perhaps one or 2 cups a day. He likes to drink coffee in the morning. He lives with his 62 year old wife, they have 2 grown children, both in the area. Daughter checks on them every day. He has a son who lives about 10 miles away. He worked for [employer]. Thankfully he has not fallen recently. He has a cane and a walker but does not use his walker very much. Sometimes he uses a cane inside the house. He feels that the Sinemet has been helpful. He does not always remember the midday dose and usually averages 2-3 pills a day. It is written for 4 times a day but he does not typically take it 4 times a day. He feels that it lasts about 3 maybe 4 hours in between. He does not report any significant side effects. In particular, he denies any nausea, headache, hallucinations,  he has had some blood pressure decrease with time. He has problems sleeping at night at times and is somewhat sleepy during the day and dozes off involuntarily. He has been driving but has limited his driving to daytime driving and local roads only.   He has not noticed any involuntary movements such as we would call dyskinesias. His neurologist moved away. I reviewed your office note from 01/22/2015, which you kindly included.   His Past Medical History Is Significant For: Past Medical History:  Diagnosis Date  . Arthritis   . Asthma   . CAD (coronary artery  disease)   . Cancer ([place])    skin   . Carotid artery occlusion   . CHF (congestive heart failure) ([place])   . Colon polyp   . COPD (chronic obstructive pulmonary disease) ([place])   . Diabetes mellitus   . GERD (gastroesophageal reflux disease)   . Heart disease   . Hyperlipidemia   . Hypertension   . Myocardial infarction    1998    His Past Surgical History Is Significant For: Past Surgical History:  Procedure Laterality Date  . CATARACT EXTRACTION    . CHOLECYSTECTOMY     Gall bladder  . CORONARY ARTERY BYPASS GRAFT  10/03/96   x7  . TONSILLECTOMY      His Family History Is Significant For: Family History  Problem Relation Age of Onset  . Aneurysm Sister   . Heart disease Mother     His Social History Is Significant For: Social History   Social History  . Marital status: Married    Spouse name: N/A  . Number of children: 1  . Years of education: 29   Occupational History  . Retired    Social History Main Topics  . Smoking status: Never Smoker  . Smokeless tobacco: Never Used  . Alcohol use No  . Drug use: No  . Sexual activity: Not Asked   Other Topics Concern  . None   Social History Narrative   Drinks coffee in the morning     His Allergies Are:  Allergies  Allergen Reactions  . Niacin And Related Other (See Comments)    Burning   :   His Current Medications Are:  Outpatient Encounter Prescriptions as of 11/17/2015  Medication Sig  . aspirin EC 81 MG tablet Take 81 mg by mouth daily.    Marland Kitchen atorvastatin (LIPITOR) 80 MG tablet Take 80 mg by mouth daily.    . benazepril (LOTENSIN) 5 MG tablet   . carbidopa-levodopa (SINEMET IR) 25-100 MG tablet 1 tablet 3 (three) times daily.   . Carbidopa-Levodopa ER (SINEMET CR) 25-100 MG tablet controlled release Take 1 tablet by mouth 3 (three) times daily.  Marland Kitchen dutasteride (AVODART) 0.5 MG capsule Take 0.5 mg by mouth daily.    Marland Kitchen ezetimibe (ZETIA) 10 MG tablet Take 10 mg by mouth daily.    . finasteride  (PROSCAR) 5 MG tablet   . ipratropium (ATROVENT) 0.06 % nasal spray   . metFORMIN (GLUCOPHAGE) 500 MG tablet Take 500 mg by mouth 2 (two) times daily with a meal.    . metoprolol (TOPROL-XL) 50 MG 24 hr tablet Take 25 mg by mouth daily.   . nabumetone (RELAFEN) 500 MG tablet Take 500 mg by mouth 2 (two) times daily.    Marland Kitchen omeprazole (PRILOSEC) 40 MG capsule   . polyethylene glycol powder (GLYCOLAX/MIRALAX) powder   . Tamsulosin HCl (FLOMAX) 0.4 MG CAPS Take 0.4 mg by mouth daily.    Marland Kitchen  traMADol (ULTRAM) 50 MG tablet    No facility-administered encounter medications on file as of 11/17/2015.   :  Review of Systems:  Out of a complete 14 point review of systems, all are reviewed and negative with the exception of these symptoms as listed below: Review of Systems  Neurological:       Daughter reports that patient is having trouble with restless legs, especially at night.  PCP asked patient to discuss allergy medication with Neurologist. They need to know which allergy medication would not aggravate his restless legs and tremors.     Objective:  Neurologic Exam  Physical Exam Physical Examination:   Vitals:   11/17/15 1301  BP: 128/72  Pulse: 76  Resp: 14    General Examination: The patient is a very pleasant 82 y.o. male in no acute distress. He is in good spirits today.  HEENT: Normocephalic, atraumatic, pupils are equal, round and reactive to light and accommodation. Funduscopic exam is normal with sharp disc margins noted. Extraocular tracking shows mild saccadic breakdown without nystagmus noted. There is limitation to upper gaze. There is mild decrease in eye blink rate. Hearing is impaired with bilateral hearing aids in place. Face is symmetric with mild facial masking and normal facial sensation. There is no lip, neck or jaw tremor. Neck is mildly rigid with intact passive ROM. There are no carotid bruits on auscultation. Oropharynx exam reveals mild mouth dryness. No  significant airway crowding is noted. Mallampati is class II. Tongue protrudes centrally and palate elevates symmetrically.  there is no significant sialorrhea.  Chest: is clear to auscultation without wheezing, rhonchi or crackles noted.  Heart: sounds are regular and normal without murmurs, rubs or gallops noted.   Abdomen: is soft, non-tender and non-distended with normal bowel sounds appreciated on auscultation.  Extremities: There is no pitting edema in the distal lower extremities bilaterally. Pedal pulses are intact.   Skin: is warm and dry with no trophic changes noted. Age-related changes are noted on the skin.   Musculoskeletal: exam reveals no obvious joint deformities, tenderness, joint swelling or erythema.  Neurologically:  Mental status: The patient is awake and alert, paying good  attention. He is able to provide his history quite well. His daughter provides details. He is oriented to time, place, circumstance and person. His memory, attention, language and knowledge are fairly well-preserved. There is no aphasia, agnosia, apraxia or anomia. There is a mild degree of bradyphrenia. Speech is mildly hypophonic with no dysarthria noted. Mood is congruent and affect is normal. Cranial nerves are as described above under HEENT exam. In addition, shoulder shrug is normal with equal shoulder height noted.  Motor exam: Normal bulk, and strength for age is noted. There are no dyskinesias noted. Tone is very mildly increased in the right upper extremity. He has a mild intermittent resting tremor in the right upper extremity only. He has no significant resting tremor elsewhere. Fine motor skills with finger taps and hand movements as well as rapid alternating patting are mildly impaired on the right and minimally so on the left. Foot agility is mildly impaired on the right and minimally impaired on the left. He stands with mild difficulty and pushes himself up. Posture is mildly stooped, could  be age-appropriate, stable. He walks with a very slight decrease in arm swing on the right. He turns slightly insecurely. He has a tendency to turn too fast, tandem walk is not possible.  Cerebellar testing shows no dysmetria or intention tremor on  finger to nose testing. Heel to shin is unremarkable bilaterally. There is no truncal or gait ataxia.   Sensory exam is intact to light touch in the upper and lower extremities. Reflexes are 1+ in the upper extremities, trace in the lower extremities.   Assessment and Plan:   In summary, Victor Bell is a very pleasant 82 year old male  with an underlying medical history of diabetes, hyperlipidemia, prostate hypertrophy, hypertension, and chronic lung disease who was previously diagnosed with Parkinson's disease, with symptoms dating back to about 6 to 7 years ago. On examination, he has mild signs of right-sided parkinsonism, likely idiopathic right-sided Parkinson's disease with mild findings and mild progression with time. He has been on Sinemet,  currently on Sinemet immediate release and Sinemet CR one pill 3 times a day each. He feels stable, has no significant or telltale improvement in his trembling, is bothered primarily by restless leg symptoms and sleep disruption secondary to leg pain and leg discomfort. I suggested he try gabapentin and low-dose starting with 100 mg each bedtime. I provided written instructions and a new prescription for this. He is particularly cautioned about the sedation and balance impairment potentially. He is furthermore advised not to combine gabapentin with tramadol. For allergies he can take Allegra or Claritin or Zyrtec generic over-the-counter, all without decongestant or -D with it. For cough he can take Mucinex, again without decongestant. I suggested we keep his Sinemet IR and Sinemet CR the same. I renewed prescriptions for that and provided a new prescription for gabapentin. We do have room to increase but for now I  would like to make sure he can tolerate the small dose, we do have to keep his age in mind as well. I suggested a 4 month checkup, sooner as needed. I answered all their questions today and the patient and his daughter were in agreement. His daughter checks on him every day, son lives close by, unfortunately, his wife has memory loss.  I spent 25 minutes in total face-to-face time with the patient, more than 50% of which was spent in counseling and coordination of care, reviewing test results, reviewing medication and discussing or reviewing the diagnosis of PD, its prognosis and treatment options.

## 2015-12-14 ENCOUNTER — Encounter: Payer: Self-pay | Admitting: Neurology

## 2015-12-22 ENCOUNTER — Telehealth: Payer: Self-pay | Admitting: Neurology

## 2015-12-22 DIAGNOSIS — G20 Parkinson's disease: Secondary | ICD-10-CM

## 2015-12-22 DIAGNOSIS — G2581 Restless legs syndrome: Secondary | ICD-10-CM

## 2015-12-22 MED ORDER — GABAPENTIN 100 MG PO CAPS: ORAL_CAPSULE | ORAL | 5 refills | Status: DC

## 2015-12-22 NOTE — Telephone Encounter (Signed)
Please call patient's daughter back. She emailed last week regarding his restless leg symptoms. Gabapentin 100 mg at night has not been helpful. We can try to increase the dose to 200 mg at night for a couple weeks and then increase further to 300 mg at night as tolerated. I have adjusted his prescription and sent to his pharmacy. please call daughter for instructions.

## 2015-12-23 NOTE — Telephone Encounter (Signed)
LM with recommendations below. Advised how to take the medication and which pharmacy we sent it too. Left call back number for further questions.

## 2015-12-24 DIAGNOSIS — E119 Type 2 diabetes mellitus without complications: Secondary | ICD-10-CM | POA: Diagnosis not present

## 2015-12-24 DIAGNOSIS — I10 Essential (primary) hypertension: Secondary | ICD-10-CM | POA: Diagnosis not present

## 2015-12-24 DIAGNOSIS — I2510 Atherosclerotic heart disease of native coronary artery without angina pectoris: Secondary | ICD-10-CM | POA: Diagnosis not present

## 2015-12-24 DIAGNOSIS — J45909 Unspecified asthma, uncomplicated: Secondary | ICD-10-CM | POA: Diagnosis not present

## 2015-12-25 DIAGNOSIS — K649 Unspecified hemorrhoids: Secondary | ICD-10-CM | POA: Diagnosis not present

## 2015-12-25 DIAGNOSIS — I10 Essential (primary) hypertension: Secondary | ICD-10-CM | POA: Diagnosis not present

## 2015-12-25 DIAGNOSIS — R42 Dizziness and giddiness: Secondary | ICD-10-CM | POA: Diagnosis not present

## 2015-12-25 DIAGNOSIS — G20 Parkinson's disease: Secondary | ICD-10-CM | POA: Diagnosis not present

## 2015-12-25 DIAGNOSIS — Z299 Encounter for prophylactic measures, unspecified: Secondary | ICD-10-CM | POA: Diagnosis not present

## 2016-01-04 DIAGNOSIS — Z85820 Personal history of malignant melanoma of skin: Secondary | ICD-10-CM | POA: Diagnosis not present

## 2016-01-04 DIAGNOSIS — X32XXXD Exposure to sunlight, subsequent encounter: Secondary | ICD-10-CM | POA: Diagnosis not present

## 2016-01-04 DIAGNOSIS — L570 Actinic keratosis: Secondary | ICD-10-CM | POA: Diagnosis not present

## 2016-01-04 DIAGNOSIS — Z1283 Encounter for screening for malignant neoplasm of skin: Secondary | ICD-10-CM | POA: Diagnosis not present

## 2016-01-04 DIAGNOSIS — L820 Inflamed seborrheic keratosis: Secondary | ICD-10-CM | POA: Diagnosis not present

## 2016-01-04 DIAGNOSIS — Z08 Encounter for follow-up examination after completed treatment for malignant neoplasm: Secondary | ICD-10-CM | POA: Diagnosis not present

## 2016-01-11 DIAGNOSIS — I2510 Atherosclerotic heart disease of native coronary artery without angina pectoris: Secondary | ICD-10-CM | POA: Diagnosis not present

## 2016-01-11 DIAGNOSIS — I10 Essential (primary) hypertension: Secondary | ICD-10-CM | POA: Diagnosis not present

## 2016-01-11 DIAGNOSIS — E119 Type 2 diabetes mellitus without complications: Secondary | ICD-10-CM | POA: Diagnosis not present

## 2016-01-11 DIAGNOSIS — J45909 Unspecified asthma, uncomplicated: Secondary | ICD-10-CM | POA: Diagnosis not present

## 2016-02-16 DIAGNOSIS — J309 Allergic rhinitis, unspecified: Secondary | ICD-10-CM | POA: Diagnosis not present

## 2016-02-16 DIAGNOSIS — I2510 Atherosclerotic heart disease of native coronary artery without angina pectoris: Secondary | ICD-10-CM | POA: Diagnosis not present

## 2016-02-16 DIAGNOSIS — E119 Type 2 diabetes mellitus without complications: Secondary | ICD-10-CM | POA: Diagnosis not present

## 2016-02-16 DIAGNOSIS — I10 Essential (primary) hypertension: Secondary | ICD-10-CM | POA: Diagnosis not present

## 2016-02-19 DIAGNOSIS — E119 Type 2 diabetes mellitus without complications: Secondary | ICD-10-CM | POA: Diagnosis not present

## 2016-02-19 DIAGNOSIS — J45909 Unspecified asthma, uncomplicated: Secondary | ICD-10-CM | POA: Diagnosis not present

## 2016-02-19 DIAGNOSIS — I2510 Atherosclerotic heart disease of native coronary artery without angina pectoris: Secondary | ICD-10-CM | POA: Diagnosis not present

## 2016-02-19 DIAGNOSIS — I10 Essential (primary) hypertension: Secondary | ICD-10-CM | POA: Diagnosis not present

## 2016-03-16 DIAGNOSIS — H04123 Dry eye syndrome of bilateral lacrimal glands: Secondary | ICD-10-CM | POA: Diagnosis not present

## 2016-03-16 DIAGNOSIS — Z961 Presence of intraocular lens: Secondary | ICD-10-CM | POA: Diagnosis not present

## 2016-03-16 DIAGNOSIS — H353131 Nonexudative age-related macular degeneration, bilateral, early dry stage: Secondary | ICD-10-CM | POA: Diagnosis not present

## 2016-03-16 DIAGNOSIS — H11823 Conjunctivochalasis, bilateral: Secondary | ICD-10-CM | POA: Diagnosis not present

## 2016-03-16 DIAGNOSIS — H10413 Chronic giant papillary conjunctivitis, bilateral: Secondary | ICD-10-CM | POA: Diagnosis not present

## 2016-03-16 DIAGNOSIS — H4322 Crystalline deposits in vitreous body, left eye: Secondary | ICD-10-CM | POA: Diagnosis not present

## 2016-03-16 DIAGNOSIS — E119 Type 2 diabetes mellitus without complications: Secondary | ICD-10-CM | POA: Diagnosis not present

## 2016-03-22 ENCOUNTER — Ambulatory Visit (INDEPENDENT_AMBULATORY_CARE_PROVIDER_SITE_OTHER): Payer: Medicare Other | Admitting: Neurology

## 2016-03-22 ENCOUNTER — Encounter: Payer: Self-pay | Admitting: Neurology

## 2016-03-22 VITALS — BP 153/77 | HR 68 | Resp 20 | Ht 67.0 in | Wt 158.0 lb

## 2016-03-22 DIAGNOSIS — G4761 Periodic limb movement disorder: Secondary | ICD-10-CM | POA: Diagnosis not present

## 2016-03-22 DIAGNOSIS — G2581 Restless legs syndrome: Secondary | ICD-10-CM | POA: Diagnosis not present

## 2016-03-22 DIAGNOSIS — G20 Parkinson's disease: Secondary | ICD-10-CM | POA: Diagnosis not present

## 2016-03-22 MED ORDER — CARBIDOPA-LEVODOPA ER 25-100 MG PO TBCR: 1.0000 | EXTENDED_RELEASE_TABLET | Freq: Three times a day (TID) | ORAL | 5 refills | Status: DC

## 2016-03-22 MED ORDER — CARBIDOPA-LEVODOPA 25-100 MG PO TABS: 1.0000 | ORAL_TABLET | Freq: Three times a day (TID) | ORAL | 5 refills | Status: DC

## 2016-03-22 NOTE — Patient Instructions (Addendum)
You could try the tramadol at night for your restless legs and leg twitching, as long as you don't take it every night.  Take your Sinemet and your Sinemet CR, both pills 3 times a day.  For your balance, we can consider physical therapy, but you feel, it has aggravated your back. Please use your walker for safety.

## 2016-03-22 NOTE — Progress Notes (Signed)
Subjective:    Patient ID: Victor Bell is a 83 y.o. male.  HPI     Interim history:   Victor Bell is a 83 year old left-handed gentleman with an underlying medical history of diabetes, hyperlipidemia, prostate hypertrophy, hypertension, and chronic lung disease who presents for follow up consultation of his right-sided parkinsonism of about 6-7 years duration. The patient is accompanied by his daughter today. I last saw him on 11/17/2015, at which time he reported doing okay, he had some back pain, for which he had epidural steroid injections. He reported leg pains and history of restless leg syndrome. He had flareup of allergy symptoms, had gone through allergy shots decades ago and was on over-the-counter allergy medication. I asked him to continue with Sinemet and Sinemet CR. He was advised to avoid any decongestants over-the-counter. I suggested we start him on a trial of low-dose gabapentin for his restless leg syndrome. He was advised to avoid any benzodiazepines. His daughter emailed in November 2017 with concerns that he still had restless leg symptoms. I suggested we gradually increased his gabapentin to 200 mg, then 300 mg daily.  Today, 03/22/2016 (all dictated new, as well as above notes, some dictation done in note pad or Word, outside of chart, may appear as copied):  He reports feeling stable for the most part, [place] one [place]. Thankfully, he did not hurt himself but he did fall in the bathroom trying to get out of the top, he pulled the shower curtain down as he fell. Daughter states that he did not have any injuries. He does not like to use a walker or cane. He has both available. He had SEs from the gabapentin, including next day grogginess and more jittery from 2 pills and had to stop. He has back pain, avoids taking tramadol. Has trouble falling asleep, Has a long-standing history of difficulty with sleep maintenance, has tried sleeping pills in the  past. He has not always been taking his last dose of Sinemet and Sinemet CR. He is supposed to take the IR and CR together 3 times a day. He takes in the morning after breakfast which varies in time. Then again around supper and then at bedtime but the bedtime dose he does not always take, unclear why.  The patient's allergies, current medications, family history, past medical history, past social history, past surgical history and problem list were reviewed and updated as appropriate.   Previously (copied from previous notes for reference):  Of note, they had to cancel an appointment for 10/06/2015. I saw him on 06/01/2015, at which time he reported not feeling much in the way of difference on the long acting vs. the IR C/L. He would not always remember to take the 3 pills a day, sometimes in midday he would take the IR. He had an increase in the metformin. He lost about 10 lb in 3 months. He felt that his tremor was getting worse. He had occasional lightheadedness upon standing quickly. He cannot exercise very much because of lumbar spinal stenosis and lower back pain, sometimes radiating to the left. He had no recent falls. I suggested he take 1 pill of immediate release Sinemet along with one CR 3 times a day for a total of 6 pills a day. I first met him on 03/03/2015 at the request of his primary care physician, at which time he reported a prior diagnosis of Parkinson's disease and he is to follow with a neurologist  moved away. He was on Sinemet. He was not always remembering his medication. We switched his Sinemet to Sinemet CR 25-100 milligrams strength one tablet 3 times a day about 4 hourly dosing.   03/03/2015: He was previously diagnosed with Parkinson's disease. He has seen Dr. Brandon Melnick. Symptoms date back to 6 years ago with mild progression noted. He started noticing a right hand tremor at rest about 6 years ago. Symptoms have progressed very mildly over the course of time. He has been on C/L  for about 4 years.    He has been on Sinemet. He is currently on 25-100 milligrams strength one pill 3 to 4 times a day. He has mild constipation which is manageable. He does not like to drink water and drinks perhaps one or 2 cups a day. He likes to drink coffee in the morning. He lives with his 94 year old wife, they have 2 grown children, both in the area. Daughter checks on them every day. He has a son who lives about 10 miles away. He worked for [employer]. Thankfully he has not fallen recently. He has a cane and a walker but does not use his walker very much. Sometimes he uses a cane inside the house. He feels that the Sinemet has been helpful. He does not always remember the midday dose and usually averages 2-3 pills a day. It is written for 4 times a day but he does not typically take it 4 times a day. He feels that it lasts about 3 maybe 4 hours in between. He does not report any significant side effects. In particular, he denies any nausea, headache, hallucinations, he has had some blood pressure decrease with time. He has problems sleeping at night at times and is somewhat sleepy during the day and dozes off involuntarily. He has been driving but has limited his driving to daytime driving and local roads only.   He has not noticed any involuntary movements such as we would call dyskinesias. His neurologist moved away. I reviewed your office note from 01/22/2015, which you kindly included.  His Past Medical History Is Significant For: Past Medical History:  Diagnosis Date  . Arthritis   . Asthma   . CAD (coronary artery disease)   . Cancer ([place])    skin   . Carotid artery occlusion   . CHF (congestive heart failure) ([place])   . Colon polyp   . COPD (chronic obstructive pulmonary disease) ([place])   . Diabetes mellitus   . GERD (gastroesophageal reflux disease)   . Heart disease   . Hyperlipidemia   . Hypertension   . Myocardial infarction    1998    His Past Surgical History Is  Significant For: Past Surgical History:  Procedure Laterality Date  . CATARACT EXTRACTION    . CHOLECYSTECTOMY     Gall bladder  . CORONARY ARTERY BYPASS GRAFT  10/03/96   x7  . TONSILLECTOMY      His Family History Is Significant For: Family History  Problem Relation Age of Onset  . Aneurysm Sister   . Heart disease Mother     His Social History Is Significant For: Social History   Social History  . Marital status: Married    Spouse name: N/A  . Number of children: 1  . Years of education: 1   Occupational History  . Retired    Social History Main Topics  . Smoking status: Never Smoker  . Smokeless tobacco: Never Used  . Alcohol use No  .  Drug use: No  . Sexual activity: Not Asked   Other Topics Concern  . None   Social History Narrative   Drinks coffee in the morning     His Allergies Are:  Allergies  Allergen Reactions  . Niacin And Related Other (See Comments)    Burning   :   His Current Medications Are:  Outpatient Encounter Prescriptions as of 03/22/2016  Medication Sig  . aspirin EC 81 MG tablet Take 81 mg by mouth daily.    Marland Kitchen atorvastatin (LIPITOR) 80 MG tablet Take 80 mg by mouth daily.    . benazepril (LOTENSIN) 5 MG tablet   . carbidopa-levodopa (SINEMET IR) 25-100 MG tablet 1 tablet 3 (three) times daily.   . Carbidopa-Levodopa ER (SINEMET CR) 25-100 MG tablet controlled release Take 1 tablet by mouth 3 (three) times daily.  Marland Kitchen dutasteride (AVODART) 0.5 MG capsule Take 0.5 mg by mouth daily.    Marland Kitchen ezetimibe (ZETIA) 10 MG tablet Take 10 mg by mouth daily.    . finasteride (PROSCAR) 5 MG tablet   . gabapentin (NEURONTIN) 100 MG capsule Take 2 pills each night for 2 weeks, then 3 pills each night if tolerated each night.  Marland Kitchen ipratropium (ATROVENT) 0.06 % nasal spray   . metFORMIN (GLUCOPHAGE) 500 MG tablet Take 500 mg by mouth 3 (three) times daily.   . metoprolol (TOPROL-XL) 50 MG 24 hr tablet Take 25 mg by mouth daily.   . nabumetone (RELAFEN)  500 MG tablet Take 500 mg by mouth 2 (two) times daily.    Marland Kitchen omeprazole (PRILOSEC) 40 MG capsule   . polyethylene glycol powder (GLYCOLAX/MIRALAX) powder   . Tamsulosin HCl (FLOMAX) 0.4 MG CAPS Take 0.4 mg by mouth daily.    . traMADol (ULTRAM) 50 MG tablet    No facility-administered encounter medications on file as of 03/22/2016.   :  Review of Systems:  Out of a complete 14 point review of systems, all are reviewed and negative with the exception of these symptoms as listed below: Review of Systems  Neurological:       Pt presents to discuss his PD. Pt thinks he has remained stable.    Objective:  Neurologic Exam  Physical Exam Physical Examination:   Vitals:   03/22/16 1302  BP: (!) 153/77  Pulse: 68  Resp: 20   General Examination: The patient is a very pleasant 83 y.o. male in no acute distress. He appears frail.    HEENT: Normocephalic, atraumatic,  pupils are anisocoric, reactive to light, status post bilateral cataract repairs. He has mild facial masking, mild decrease in eye blink rate, hearing is impaired, he has bilateral hearing aids in place. Neck is mildly rigid, he has on oropharynx exam no significant mouth dryness, no significant drooling. He has normal tongue and palate movements. Speech is mildly hypophonic.   Chest: Clear to auscultation without wheezing, rhonchi or crackles noted.  Heart: S1+S2+0, regular and normal without murmurs, rubs or gallops noted.   Abdomen: Soft, non-tender and non-distended with normal bowel sounds appreciated on auscultation.  Extremities: There is no pitting edema in the distal lower extremities bilaterally. Pedal pulses are intact.  Skin: Warm and dry without trophic changes noted.  Musculoskeletal: exam reveals no obvious joint deformities, tenderness or joint swelling or erythema.   Neurologically:  Mental status: The patient is awake, alert and oriented in all 4 spheres. His immediate and remote memory, attention,  language skills and fund of knowledge are appropriate. There is  no evidence of aphasia, agnosia, apraxia or anomia. Speech is clear with normal prosody and enunciation. Thought process is linear. Mood is normal and affect is normal.  Cranial nerves II - XII are as described above under HEENT exam. In addition: shoulder shrug is normal with equal shoulder height noted. Motor exam: thin bulk, global strength of 4+ out of 5, no dyskinesias. Tone is mildly increased in the right upper extremity with mild intermittent resting tremor in the right hand only. He has no other resting tremor, very minimal postural and action tremor in the right upper extremity more than left. Reflexes are 1+, absent in the ankles. Fine motor skills are mildly impaired, right side more than left. Sensory exam is intact to light touch. Romberg is not testable safely. He stands with mild difficulty and pushes himself up, posture is mildly stooped but not significantly changed and not bad for age. He has mild decrease in arm swing on the right, he turns slightly insecurely and balance is mildly impaired. Tandem walk is not testable safely. On cerebellar testing, he has no dysmetria or intention tremor. Assessment and Plan:  In summary, Kennet Mccort is a very pleasant 83 y.o.-year old male withAn underlying medical history of diabetes, cataracts, status post repair, hyperlipidemia, BPH, hypertension, who presents for follow-up consultation of his right-sided predominant Parkinson's disease of about 7 years duration. His findings are still in the mild range but he has had more balance problems, one recent fall. He is advised to use his walker for gait safety. We can consider physical therapy for gait and balance training but he states that physical therapy has in the past flared up his back problems. He still has restless leg symptoms and also symptoms in keeping with right-sided sciatica. He has tramadol for as needed use available for his  back pain but does not typically use it regularly. He had side effects with gabapentin including more jitteriness and grogginess. Therefore he had to stop it. He is advised to be compliant with his Sinemet and Sinemet CR 1 pill each 3 times a day, it may help his restless leg symptoms and leg twitching at night so therefore he is advised to especially not skip his nighttime dose which he has a tendency to skip. Furthermore, he could try tramadol as needed at night so long he does not take it every night. He is agreeable to trying this. He did not actually need refills on his Sinemet and Sinemet CR and his long-term prescriptions are provided by his primary care physician. We talked about fall risk and gait safety today. He is advised to be cautious when walking especially when he has to get up at night.  I would like to see him back in 6 months or sooner as needed. I answered all their questions today and the patient and his daughter were in agreement.  I spent 25 minutes in total face-to-face time with the patient, more than 50% of which was spent in counseling and coordination of care, reviewing test results, reviewing medication and discussing or reviewing the diagnosis of PD, RLS and PLMs, the prognosis and treatment options. Pertinent laboratory and imaging test results that were available during this visit with the patient were reviewed by me and considered in my medical decision making (see chart for details).

## 2016-04-12 DIAGNOSIS — I2510 Atherosclerotic heart disease of native coronary artery without angina pectoris: Secondary | ICD-10-CM | POA: Diagnosis not present

## 2016-04-12 DIAGNOSIS — I10 Essential (primary) hypertension: Secondary | ICD-10-CM | POA: Diagnosis not present

## 2016-04-12 DIAGNOSIS — J45909 Unspecified asthma, uncomplicated: Secondary | ICD-10-CM | POA: Diagnosis not present

## 2016-04-12 DIAGNOSIS — E119 Type 2 diabetes mellitus without complications: Secondary | ICD-10-CM | POA: Diagnosis not present

## 2016-05-13 DIAGNOSIS — I2510 Atherosclerotic heart disease of native coronary artery without angina pectoris: Secondary | ICD-10-CM | POA: Diagnosis not present

## 2016-05-13 DIAGNOSIS — I10 Essential (primary) hypertension: Secondary | ICD-10-CM | POA: Diagnosis not present

## 2016-05-13 DIAGNOSIS — J45909 Unspecified asthma, uncomplicated: Secondary | ICD-10-CM | POA: Diagnosis not present

## 2016-05-13 DIAGNOSIS — E119 Type 2 diabetes mellitus without complications: Secondary | ICD-10-CM | POA: Diagnosis not present

## 2016-05-24 DIAGNOSIS — E7800 Pure hypercholesterolemia, unspecified: Secondary | ICD-10-CM | POA: Diagnosis not present

## 2016-05-24 DIAGNOSIS — J309 Allergic rhinitis, unspecified: Secondary | ICD-10-CM | POA: Diagnosis not present

## 2016-05-24 DIAGNOSIS — N400 Enlarged prostate without lower urinary tract symptoms: Secondary | ICD-10-CM | POA: Diagnosis not present

## 2016-05-24 DIAGNOSIS — I2510 Atherosclerotic heart disease of native coronary artery without angina pectoris: Secondary | ICD-10-CM | POA: Diagnosis not present

## 2016-05-24 DIAGNOSIS — G20 Parkinson's disease: Secondary | ICD-10-CM | POA: Diagnosis not present

## 2016-05-24 DIAGNOSIS — I10 Essential (primary) hypertension: Secondary | ICD-10-CM | POA: Diagnosis not present

## 2016-05-24 DIAGNOSIS — Z299 Encounter for prophylactic measures, unspecified: Secondary | ICD-10-CM | POA: Diagnosis not present

## 2016-05-24 DIAGNOSIS — Z6825 Body mass index (BMI) 25.0-25.9, adult: Secondary | ICD-10-CM | POA: Diagnosis not present

## 2016-05-24 DIAGNOSIS — M545 Low back pain: Secondary | ICD-10-CM | POA: Diagnosis not present

## 2016-05-24 DIAGNOSIS — K219 Gastro-esophageal reflux disease without esophagitis: Secondary | ICD-10-CM | POA: Diagnosis not present

## 2016-05-24 DIAGNOSIS — E1165 Type 2 diabetes mellitus with hyperglycemia: Secondary | ICD-10-CM | POA: Diagnosis not present

## 2016-05-24 DIAGNOSIS — M461 Sacroiliitis, not elsewhere classified: Secondary | ICD-10-CM | POA: Diagnosis not present

## 2016-06-13 DIAGNOSIS — J309 Allergic rhinitis, unspecified: Secondary | ICD-10-CM | POA: Diagnosis not present

## 2016-06-13 DIAGNOSIS — J300 Vasomotor rhinitis: Secondary | ICD-10-CM | POA: Diagnosis not present

## 2016-06-14 DIAGNOSIS — I10 Essential (primary) hypertension: Secondary | ICD-10-CM | POA: Diagnosis not present

## 2016-06-14 DIAGNOSIS — G20 Parkinson's disease: Secondary | ICD-10-CM | POA: Diagnosis not present

## 2016-06-14 DIAGNOSIS — Z955 Presence of coronary angioplasty implant and graft: Secondary | ICD-10-CM | POA: Diagnosis not present

## 2016-06-14 DIAGNOSIS — E785 Hyperlipidemia, unspecified: Secondary | ICD-10-CM | POA: Diagnosis not present

## 2016-06-14 DIAGNOSIS — K219 Gastro-esophageal reflux disease without esophagitis: Secondary | ICD-10-CM | POA: Diagnosis not present

## 2016-06-14 DIAGNOSIS — J45909 Unspecified asthma, uncomplicated: Secondary | ICD-10-CM | POA: Diagnosis not present

## 2016-06-14 DIAGNOSIS — I119 Hypertensive heart disease without heart failure: Secondary | ICD-10-CM | POA: Diagnosis not present

## 2016-06-14 DIAGNOSIS — E119 Type 2 diabetes mellitus without complications: Secondary | ICD-10-CM | POA: Diagnosis not present

## 2016-06-14 DIAGNOSIS — Z951 Presence of aortocoronary bypass graft: Secondary | ICD-10-CM | POA: Diagnosis not present

## 2016-06-14 DIAGNOSIS — I2510 Atherosclerotic heart disease of native coronary artery without angina pectoris: Secondary | ICD-10-CM | POA: Diagnosis not present

## 2016-06-16 ENCOUNTER — Encounter: Payer: Self-pay | Admitting: Neurology

## 2016-07-04 DIAGNOSIS — M779 Enthesopathy, unspecified: Secondary | ICD-10-CM | POA: Diagnosis not present

## 2016-07-04 DIAGNOSIS — L821 Other seborrheic keratosis: Secondary | ICD-10-CM | POA: Diagnosis not present

## 2016-07-04 DIAGNOSIS — I10 Essential (primary) hypertension: Secondary | ICD-10-CM | POA: Diagnosis not present

## 2016-07-04 DIAGNOSIS — Z85820 Personal history of malignant melanoma of skin: Secondary | ICD-10-CM | POA: Diagnosis not present

## 2016-07-04 DIAGNOSIS — Z1283 Encounter for screening for malignant neoplasm of skin: Secondary | ICD-10-CM | POA: Diagnosis not present

## 2016-07-04 DIAGNOSIS — Z08 Encounter for follow-up examination after completed treatment for malignant neoplasm: Secondary | ICD-10-CM | POA: Diagnosis not present

## 2016-07-04 DIAGNOSIS — L570 Actinic keratosis: Secondary | ICD-10-CM | POA: Diagnosis not present

## 2016-07-04 DIAGNOSIS — X32XXXD Exposure to sunlight, subsequent encounter: Secondary | ICD-10-CM | POA: Diagnosis not present

## 2016-07-04 DIAGNOSIS — Z299 Encounter for prophylactic measures, unspecified: Secondary | ICD-10-CM | POA: Diagnosis not present

## 2016-07-04 DIAGNOSIS — L820 Inflamed seborrheic keratosis: Secondary | ICD-10-CM | POA: Diagnosis not present

## 2016-07-04 DIAGNOSIS — Z6824 Body mass index (BMI) 24.0-24.9, adult: Secondary | ICD-10-CM | POA: Diagnosis not present

## 2016-07-28 DIAGNOSIS — J45909 Unspecified asthma, uncomplicated: Secondary | ICD-10-CM | POA: Diagnosis not present

## 2016-07-28 DIAGNOSIS — I2510 Atherosclerotic heart disease of native coronary artery without angina pectoris: Secondary | ICD-10-CM | POA: Diagnosis not present

## 2016-07-28 DIAGNOSIS — I10 Essential (primary) hypertension: Secondary | ICD-10-CM | POA: Diagnosis not present

## 2016-07-28 DIAGNOSIS — E119 Type 2 diabetes mellitus without complications: Secondary | ICD-10-CM | POA: Diagnosis not present

## 2016-08-30 DIAGNOSIS — G20 Parkinson's disease: Secondary | ICD-10-CM | POA: Diagnosis not present

## 2016-08-30 DIAGNOSIS — Z299 Encounter for prophylactic measures, unspecified: Secondary | ICD-10-CM | POA: Diagnosis not present

## 2016-08-30 DIAGNOSIS — I10 Essential (primary) hypertension: Secondary | ICD-10-CM | POA: Diagnosis not present

## 2016-08-30 DIAGNOSIS — I6529 Occlusion and stenosis of unspecified carotid artery: Secondary | ICD-10-CM | POA: Diagnosis not present

## 2016-08-30 DIAGNOSIS — E1165 Type 2 diabetes mellitus with hyperglycemia: Secondary | ICD-10-CM | POA: Diagnosis not present

## 2016-08-30 DIAGNOSIS — I2510 Atherosclerotic heart disease of native coronary artery without angina pectoris: Secondary | ICD-10-CM | POA: Diagnosis not present

## 2016-08-30 DIAGNOSIS — Z6824 Body mass index (BMI) 24.0-24.9, adult: Secondary | ICD-10-CM | POA: Diagnosis not present

## 2016-08-30 DIAGNOSIS — N400 Enlarged prostate without lower urinary tract symptoms: Secondary | ICD-10-CM | POA: Diagnosis not present

## 2016-08-30 DIAGNOSIS — E7800 Pure hypercholesterolemia, unspecified: Secondary | ICD-10-CM | POA: Diagnosis not present

## 2016-09-19 ENCOUNTER — Encounter: Payer: Self-pay | Admitting: Neurology

## 2016-09-19 ENCOUNTER — Ambulatory Visit (INDEPENDENT_AMBULATORY_CARE_PROVIDER_SITE_OTHER): Payer: Medicare Other | Admitting: Neurology

## 2016-09-19 VITALS — BP 182/87 | HR 66 | Ht 67.0 in | Wt 154.0 lb

## 2016-09-19 DIAGNOSIS — R42 Dizziness and giddiness: Secondary | ICD-10-CM

## 2016-09-19 DIAGNOSIS — R011 Cardiac murmur, unspecified: Secondary | ICD-10-CM

## 2016-09-19 DIAGNOSIS — G20 Parkinson's disease: Secondary | ICD-10-CM

## 2016-09-19 NOTE — Progress Notes (Signed)
Subjective:    Patient ID: Victor Bell is a 83 y.o. male.  HPI     Interim history:   Victor Bell is a 83 year old left-handed gentleman with an underlying medical history of diabetes, hyperlipidemia, prostate hypertrophy, hypertension, and chronic lung disease who presents for follow up consultation of his right-sided parkinsonism of about 6-7 years duration. The patient is accompanied by his daughter again today. I last saw him on 03/22/2016, at which time he reported feeling fairly stable. He did have one fall a few months back, while at the beach. Thankfully, he did not hurt himself, fell in the bathroom and pulled the shower curtain down. He was having some back pain, was avoiding tramadol. He had some trouble falling asleep with a long-standing history of difficulty with sleep maintenance. He had tried sleeping pills in the past. He was on Sinemet immediate release and CR 1 pill 3 times a day. I suggested we continue with his regimen. His daughter emailed in the interim in May 2018 requesting whether we could increase his Sinemet IR and CR to 1 pill 4 times a day each. I advised her that we could try this.  Today, 09/19/2016 (all dictated new, as well as above notes, some dictation done in note pad or Word, outside of chart, may appear as copied):  He reports not doing so well. He has had 3 falls, per daughter, mostly in the context of working outside and feeling lightheaded afterwards. One time he was doing some painting outside, the other time he was redoing the driveway. He has a tendency to not drink water enough. He was feeling lightheaded and also had one time transient one-sided weakness which had resolved by the time patient's wife called his daughter. He has had loss of vision bilaterally or near blackout type symptoms. He gets shortness of breath easier. He has a tendency to continue to do things that he should not be doing at this time such as painting and climbing stepladders and  trying to fix things around the house and outside. He has been advised by his primary care physician to be more cautious and avoid heat exposure and strenuous exercises well. He has been suffering from nearly nightly leg cramps. He sees his cardiologist about once a year typically.   The patient's allergies, current medications, family history, past medical history, past social history, past surgical history and problem list were reviewed and updated as appropriate.    Previously (copied from previous notes for reference):   I saw him on 11/17/2015, at which time he reported doing okay, he had some back pain, for which he had epidural steroid injections. He reported leg pains and history of restless leg syndrome. He had flareup of allergy symptoms, had gone through allergy shots decades ago and was on over-the-counter allergy medication. I asked him to continue with Sinemet and Sinemet CR. He was advised to avoid any decongestants over-the-counter. I suggested we start him on a trial of low-dose gabapentin for his restless leg syndrome. He was advised to avoid any benzodiazepines. His daughter emailed in November 2017 with concerns that he still had restless leg symptoms. I suggested we gradually increased his gabapentin to 200 mg, then 300 mg daily.   Of note, they had to cancel an appointment for 10/06/2015. I saw him on 06/01/2015, at which time he reported not feeling much in the way of difference on the long acting vs. the IR C/L. He would not always remember to take the 3  pills a day, sometimes in midday he would take the IR. He had an increase in the metformin. He lost about 10 lb in 3 months. He felt that his tremor was getting worse. He had occasional lightheadedness upon standing quickly. He cannot exercise very much because of lumbar spinal stenosis and lower back pain, sometimes radiating to the left. He had no recent falls. I suggested he take 1 pill of immediate release Sinemet along with one  CR 3 times a day for a total of 6 pills a day. I first met him on 03/03/2015 at the request of his primary care physician, at which time he reported a prior diagnosis of Parkinson's disease and he is to follow with a neurologist moved away. He was on Sinemet. He was not always remembering his medication. We switched his Sinemet to Sinemet CR 25-100 milligrams strength one tablet 3 times a day about 4 hourly dosing.   03/03/2015: He was previously diagnosed with Parkinson's disease. He has seen Dr. Brandon Melnick. Symptoms date back to 6 years ago with mild progression noted. He started noticing a right hand tremor at rest about 6 years ago. Symptoms have progressed very mildly over the course of time. He has been on C/L for about 4 years.    He has been on Sinemet. He is currently on 25-100 milligrams strength one pill 3 to 4 times a day. He has mild constipation which is manageable. He does not like to drink water and drinks perhaps one or 2 cups a day. He likes to drink coffee in the morning. He lives with his 81 year old wife, they have 2 grown children, both in the area. Daughter checks on them every day. He has a son who lives about 10 miles away. He worked for [employer]. Thankfully he has not fallen recently. He has a cane and a walker but does not use his walker very much. Sometimes he uses a cane inside the house. He feels that the Sinemet has been helpful. He does not always remember the midday dose and usually averages 2-3 pills a day. It is written for 4 times a day but he does not typically take it 4 times a day. He feels that it lasts about 3 maybe 4 hours in between. He does not report any significant side effects. In particular, he denies any nausea, headache, hallucinations, he has had some blood pressure decrease with time. He has problems sleeping at night at times and is somewhat sleepy during the day and dozes off involuntarily. He has been driving but has limited his driving to daytime  driving and local roads only.   He has not noticed any involuntary movements such as we would call dyskinesias. His neurologist moved away. I reviewed your office note from 01/22/2015, which you kindly included.  His Past Medical History Is Significant For: Past Medical History:  Diagnosis Date  . Arthritis   . Asthma   . CAD (coronary artery disease)   . Cancer ([place])    skin   . Carotid artery occlusion   . CHF (congestive heart failure) ([place])   . Colon polyp   . COPD (chronic obstructive pulmonary disease) ([place])   . Diabetes mellitus   . GERD (gastroesophageal reflux disease)   . Heart disease   . Hyperlipidemia   . Hypertension   . Myocardial infarction [facility])    1998    His Past Surgical History Is Significant For: Past Surgical History:  Procedure Laterality Date  . CATARACT  EXTRACTION    . CHOLECYSTECTOMY     Gall bladder  . CORONARY ARTERY BYPASS GRAFT  10/03/96   x7  . TONSILLECTOMY      His Family History Is Significant For: Family History  Problem Relation Age of Onset  . Aneurysm Sister   . Heart disease Mother     His Social History Is Significant For: Social History   Social History  . Marital status: Married    Spouse name: N/A  . Number of children: 1  . Years of education: 71   Occupational History  . Retired    Social History Main Topics  . Smoking status: Never Smoker  . Smokeless tobacco: Never Used  . Alcohol use No  . Drug use: No  . Sexual activity: Not Asked   Other Topics Concern  . None   Social History Narrative   Drinks coffee in the morning     His Allergies Are:  Allergies  Allergen Reactions  . Niacin And Related Other (See Comments)    Burning   :   His Current Medications Are:  Outpatient Encounter Prescriptions as of 09/19/2016  Medication Sig  . aspirin EC 81 MG tablet Take 81 mg by mouth daily.    Marland Kitchen atorvastatin (LIPITOR) 80 MG tablet Take 80 mg by mouth daily.    . benazepril (LOTENSIN) 5 MG tablet   .  Carbidopa-Levodopa ER (SINEMET CR) 25-100 MG tablet controlled release Take 4 tablets by mouth 4 (four) times daily.  . cetirizine (ZYRTEC) 10 MG tablet Take 10 mg by mouth daily.  Marland Kitchen dutasteride (AVODART) 0.5 MG capsule Take 0.5 mg by mouth daily.    . finasteride (PROSCAR) 5 MG tablet   . ipratropium (ATROVENT) 0.06 % nasal spray   . metFORMIN (GLUCOPHAGE) 500 MG tablet Take 500 mg by mouth 3 (three) times daily.   . metoprolol (TOPROL-XL) 50 MG 24 hr tablet Take 25 mg by mouth daily.   . nabumetone (RELAFEN) 500 MG tablet Take 500 mg by mouth 2 (two) times daily.    Marland Kitchen omeprazole (PRILOSEC) 40 MG capsule   . polyethylene glycol powder (GLYCOLAX/MIRALAX) powder   . Tamsulosin HCl (FLOMAX) 0.4 MG CAPS Take 0.4 mg by mouth daily.    . traMADol (ULTRAM) 50 MG tablet   . [DISCONTINUED] ezetimibe (ZETIA) 10 MG tablet Take 10 mg by mouth daily.     No facility-administered encounter medications on file as of 09/19/2016.   :  Review of Systems:  Out of a complete 14 point review of systems, all are reviewed and negative with the exception of these symptoms as listed below: Review of Systems  Neurological:       Pt presents today to discuss his PD. Pt has had recent symptoms that are concerning for TIAs, per family.    Objective:  Neurological Exam  Physical Exam Physical Examination:   Vitals:   09/19/16 1255  BP: (!) 182/87  Pulse: 66   General Examination: The patient is a very pleasant 83 y.o. male in no acute distress. He appears well-developed and well-nourished and well groomed.   HEENT: Normocephalic, atraumatic, pupils are anisocoric, reactive to light, status post bilateral cataract repairs. He has mild facial masking, mild decrease in eye blink rate, hearing is impaired, he has bilateral hearing aids in place. Neck is mildly rigid, he has on oropharynx exam no significant mouth dryness, no significant drooling. He has normal tongue and palate movements. Speech is mildly  hypophonic.  Chest: Clear to auscultation without wheezing, but end-inspiratory bibasilar mild crackles noted.  Heart: S1+S2+0, regular with systolic heart murmurs.   Abdomen: Soft, non-tender and non-distended with normal bowel sounds appreciated on auscultation.  Extremities: There is no pitting edema in the distal lower extremities bilaterally. Pedal pulses are intact.  Skin: Warm and dry without trophic changes noted.Chronic skin changes in keeping with chronic sun exposure, no significant bruising noted.  Musculoskeletal: exam reveals no obvious joint deformities, tenderness or joint swelling or erythema.   Neurologically:  Mental status: The patient is awake, alert and oriented in all 4 spheres. His immediate and remote memory, attention, language skills and fund of knowledge are appropriate. There is no evidence of aphasia, agnosia, apraxia or anomia. Speech is clear with normal prosody and enunciation. Thought process is linear. Mood is normal and affect is normal.  Cranial nerves II - XII are as described above under HEENT exam. In addition: shoulder shrug is normal with equal shoulder height noted. Motor exam: thin bulk, global strength of 4+ out of 5, no dyskinesias. Tone is mildly increased in the right upper extremity with mild intermittent resting tremor in the right hand only. He has no other resting tremor, very minimal postural and action tremor in the right upper extremity more than left. Reflexes are 1+, absent in the ankles. Fine motor skills are mildly impaired, right side more than left. Sensory exam is intact to light touch. Romberg is not testable safely. He stands with mild difficulty and pushes himself up, posture is mildly stooped but not significantly changed and not bad for age. He has mild decrease in arm swing on the right, he turns slightly insecurely and balance is mildly impaired. Tandem walk is not testable safely. On cerebellar testing, he has no  dysmetria or intention tremor. Assessment and Plan:  In summary, Victor Bell is a very pleasant 83 year old male with an underlying medical history of diabetes, cataracts, status post repair, hyperlipidemia, BPH, hypertension, who presents for follow-up consultation of his right-sided predominant Parkinson's disease of about 7 to 8 years' duration. His findings are still in the mild range but he has had more balance problems,more recent falls, also recent lightheadedness, in keeping with blood pressure drops, in the context of exertion outside in the heat. He is strongly advised not to exert and not to engage in strenuous activity such as painting outside and redoing the driveway, climbing ladders or stepladders even. He is advised to be better hydrated. For nocturnal leg cramps, there is no specific medication unfortunately. A muscle relaxant is sometimes tried but he would be at risk for side effects. He can certainly try mustard which tends to help some patients. He is advised strongly to stay better hydrated and avoid heat exposure. For gait safety, he is advised to start using his walker rather than his single-point cane. I noticed a heart murmur today and mild inspiratory bibasilar crackles. He is advised to discuss this with his primary care physician and perhaps see his cardiologist sooner than planned. He has risk factors for TIA but his symptoms that he and his daughter describe may be more in keeping with sudden blood pressure drops. He is already on baby aspirin and I would not suggest any medication changes at this time. We will continue with his Sinemet CR and IR at the current doses. I suggested a 4 month checkup, sooner as needed. I answered all their questions today and the patient and his daughter were in agreement. I  spent 25 minutes in total face-to-face time with the patient, more than 50% of which was spent in counseling and coordination of care, reviewing test results, reviewing  medication and discussing or reviewing the diagnosis of PD, its prognosis and treatment options. Pertinent laboratory and imaging test results that were available during this visit with the patient were reviewed by me and considered in my medical decision making (see chart for details).

## 2016-09-19 NOTE — Patient Instructions (Addendum)
We will continue with your medications at the current doses.   For cramping, I would not suggest any new medication, but I would to make sure that you drink more water. Some patients have success with Mustard.   You have risk factors for TIA, but there is no additional medication I can suggest.   I noticed a heart murmur and mild crackles at your lung basis. Please discuss with Dr. Woody Seller.   I think some of your symptoms may be due to blood pressure dropping. Please avoid strenuous work especially outside, such as painting or redoing the floor.   Please remember: walk with walker at all times, turn slowly, no bending down to pick anything, no heavy lifting, be extra careful at night and first thing in the morning. Also, be careful in the Bathroom and the kitchen. I will see you back in 4 months.

## 2016-10-03 DIAGNOSIS — Z299 Encounter for prophylactic measures, unspecified: Secondary | ICD-10-CM | POA: Diagnosis not present

## 2016-10-03 DIAGNOSIS — I6529 Occlusion and stenosis of unspecified carotid artery: Secondary | ICD-10-CM | POA: Diagnosis not present

## 2016-10-03 DIAGNOSIS — I10 Essential (primary) hypertension: Secondary | ICD-10-CM | POA: Diagnosis not present

## 2016-10-03 DIAGNOSIS — E1165 Type 2 diabetes mellitus with hyperglycemia: Secondary | ICD-10-CM | POA: Diagnosis not present

## 2016-10-03 DIAGNOSIS — I2510 Atherosclerotic heart disease of native coronary artery without angina pectoris: Secondary | ICD-10-CM | POA: Diagnosis not present

## 2016-10-03 DIAGNOSIS — Z6825 Body mass index (BMI) 25.0-25.9, adult: Secondary | ICD-10-CM | POA: Diagnosis not present

## 2016-10-03 DIAGNOSIS — E7800 Pure hypercholesterolemia, unspecified: Secondary | ICD-10-CM | POA: Diagnosis not present

## 2016-10-03 DIAGNOSIS — N400 Enlarged prostate without lower urinary tract symptoms: Secondary | ICD-10-CM | POA: Diagnosis not present

## 2016-10-03 DIAGNOSIS — R011 Cardiac murmur, unspecified: Secondary | ICD-10-CM | POA: Diagnosis not present

## 2016-10-03 DIAGNOSIS — G20 Parkinson's disease: Secondary | ICD-10-CM | POA: Diagnosis not present

## 2016-10-17 DIAGNOSIS — R010 Benign and innocent cardiac murmurs: Secondary | ICD-10-CM | POA: Diagnosis not present

## 2016-10-24 DIAGNOSIS — I2510 Atherosclerotic heart disease of native coronary artery without angina pectoris: Secondary | ICD-10-CM | POA: Diagnosis not present

## 2016-10-24 DIAGNOSIS — J45909 Unspecified asthma, uncomplicated: Secondary | ICD-10-CM | POA: Diagnosis not present

## 2016-10-24 DIAGNOSIS — I10 Essential (primary) hypertension: Secondary | ICD-10-CM | POA: Diagnosis not present

## 2016-10-24 DIAGNOSIS — E119 Type 2 diabetes mellitus without complications: Secondary | ICD-10-CM | POA: Diagnosis not present

## 2016-11-15 DIAGNOSIS — Z1331 Encounter for screening for depression: Secondary | ICD-10-CM | POA: Diagnosis not present

## 2016-11-15 DIAGNOSIS — E7800 Pure hypercholesterolemia, unspecified: Secondary | ICD-10-CM | POA: Diagnosis not present

## 2016-11-15 DIAGNOSIS — E1165 Type 2 diabetes mellitus with hyperglycemia: Secondary | ICD-10-CM | POA: Diagnosis not present

## 2016-11-15 DIAGNOSIS — Z0000 Encounter for general adult medical examination without abnormal findings: Secondary | ICD-10-CM | POA: Diagnosis not present

## 2016-11-15 DIAGNOSIS — Z7189 Other specified counseling: Secondary | ICD-10-CM | POA: Diagnosis not present

## 2016-11-15 DIAGNOSIS — Z1339 Encounter for screening examination for other mental health and behavioral disorders: Secondary | ICD-10-CM | POA: Diagnosis not present

## 2016-11-15 DIAGNOSIS — R5383 Other fatigue: Secondary | ICD-10-CM | POA: Diagnosis not present

## 2016-11-15 DIAGNOSIS — Z79899 Other long term (current) drug therapy: Secondary | ICD-10-CM | POA: Diagnosis not present

## 2016-11-15 DIAGNOSIS — Z125 Encounter for screening for malignant neoplasm of prostate: Secondary | ICD-10-CM | POA: Diagnosis not present

## 2016-11-15 DIAGNOSIS — G20 Parkinson's disease: Secondary | ICD-10-CM | POA: Diagnosis not present

## 2016-11-15 DIAGNOSIS — Z23 Encounter for immunization: Secondary | ICD-10-CM | POA: Diagnosis not present

## 2016-11-15 DIAGNOSIS — M461 Sacroiliitis, not elsewhere classified: Secondary | ICD-10-CM | POA: Diagnosis not present

## 2016-11-15 DIAGNOSIS — Z6823 Body mass index (BMI) 23.0-23.9, adult: Secondary | ICD-10-CM | POA: Diagnosis not present

## 2016-11-15 DIAGNOSIS — Z299 Encounter for prophylactic measures, unspecified: Secondary | ICD-10-CM | POA: Diagnosis not present

## 2016-11-15 DIAGNOSIS — I10 Essential (primary) hypertension: Secondary | ICD-10-CM | POA: Diagnosis not present

## 2016-11-24 DIAGNOSIS — E119 Type 2 diabetes mellitus without complications: Secondary | ICD-10-CM | POA: Diagnosis not present

## 2016-11-24 DIAGNOSIS — I2510 Atherosclerotic heart disease of native coronary artery without angina pectoris: Secondary | ICD-10-CM | POA: Diagnosis not present

## 2016-11-24 DIAGNOSIS — J45909 Unspecified asthma, uncomplicated: Secondary | ICD-10-CM | POA: Diagnosis not present

## 2016-11-24 DIAGNOSIS — I10 Essential (primary) hypertension: Secondary | ICD-10-CM | POA: Diagnosis not present

## 2016-12-12 DIAGNOSIS — Z6824 Body mass index (BMI) 24.0-24.9, adult: Secondary | ICD-10-CM | POA: Diagnosis not present

## 2016-12-12 DIAGNOSIS — M25511 Pain in right shoulder: Secondary | ICD-10-CM | POA: Diagnosis not present

## 2016-12-12 DIAGNOSIS — Z299 Encounter for prophylactic measures, unspecified: Secondary | ICD-10-CM | POA: Diagnosis not present

## 2016-12-20 DIAGNOSIS — E1165 Type 2 diabetes mellitus with hyperglycemia: Secondary | ICD-10-CM | POA: Diagnosis not present

## 2016-12-20 DIAGNOSIS — G20 Parkinson's disease: Secondary | ICD-10-CM | POA: Diagnosis not present

## 2016-12-20 DIAGNOSIS — Z299 Encounter for prophylactic measures, unspecified: Secondary | ICD-10-CM | POA: Diagnosis not present

## 2016-12-20 DIAGNOSIS — M25511 Pain in right shoulder: Secondary | ICD-10-CM | POA: Diagnosis not present

## 2016-12-20 DIAGNOSIS — Z6823 Body mass index (BMI) 23.0-23.9, adult: Secondary | ICD-10-CM | POA: Diagnosis not present

## 2016-12-30 ENCOUNTER — Other Ambulatory Visit (HOSPITAL_COMMUNITY): Payer: Self-pay | Admitting: Orthopedic Surgery

## 2016-12-30 DIAGNOSIS — M25511 Pain in right shoulder: Secondary | ICD-10-CM | POA: Diagnosis not present

## 2016-12-30 DIAGNOSIS — M542 Cervicalgia: Secondary | ICD-10-CM | POA: Diagnosis not present

## 2017-01-05 DIAGNOSIS — M542 Cervicalgia: Secondary | ICD-10-CM | POA: Diagnosis not present

## 2017-01-05 DIAGNOSIS — M7591 Shoulder lesion, unspecified, right shoulder: Secondary | ICD-10-CM | POA: Diagnosis not present

## 2017-01-05 DIAGNOSIS — M4802 Spinal stenosis, cervical region: Secondary | ICD-10-CM | POA: Diagnosis not present

## 2017-01-05 DIAGNOSIS — M25511 Pain in right shoulder: Secondary | ICD-10-CM | POA: Diagnosis not present

## 2017-01-05 DIAGNOSIS — M47812 Spondylosis without myelopathy or radiculopathy, cervical region: Secondary | ICD-10-CM | POA: Diagnosis not present

## 2017-01-05 DIAGNOSIS — M7551 Bursitis of right shoulder: Secondary | ICD-10-CM | POA: Diagnosis not present

## 2017-01-05 DIAGNOSIS — M19011 Primary osteoarthritis, right shoulder: Secondary | ICD-10-CM | POA: Diagnosis not present

## 2017-01-09 ENCOUNTER — Ambulatory Visit (HOSPITAL_COMMUNITY): Payer: Medicare Other

## 2017-01-16 DIAGNOSIS — M5412 Radiculopathy, cervical region: Secondary | ICD-10-CM | POA: Diagnosis not present

## 2017-01-16 DIAGNOSIS — M542 Cervicalgia: Secondary | ICD-10-CM | POA: Diagnosis not present

## 2017-01-16 DIAGNOSIS — M5032 Other cervical disc degeneration, mid-cervical region, unspecified level: Secondary | ICD-10-CM | POA: Diagnosis not present

## 2017-01-16 DIAGNOSIS — M25511 Pain in right shoulder: Secondary | ICD-10-CM | POA: Diagnosis not present

## 2017-01-18 DIAGNOSIS — M25511 Pain in right shoulder: Secondary | ICD-10-CM | POA: Diagnosis not present

## 2017-01-26 DIAGNOSIS — M542 Cervicalgia: Secondary | ICD-10-CM | POA: Diagnosis not present

## 2017-01-26 DIAGNOSIS — M25511 Pain in right shoulder: Secondary | ICD-10-CM | POA: Diagnosis not present

## 2017-01-26 DIAGNOSIS — M5412 Radiculopathy, cervical region: Secondary | ICD-10-CM | POA: Diagnosis not present

## 2017-01-26 DIAGNOSIS — M5032 Other cervical disc degeneration, mid-cervical region, unspecified level: Secondary | ICD-10-CM | POA: Diagnosis not present

## 2017-01-30 ENCOUNTER — Ambulatory Visit: Payer: Medicare Other | Admitting: Neurology

## 2017-02-07 DIAGNOSIS — M5412 Radiculopathy, cervical region: Secondary | ICD-10-CM | POA: Diagnosis not present

## 2017-02-07 DIAGNOSIS — M542 Cervicalgia: Secondary | ICD-10-CM | POA: Diagnosis not present

## 2017-02-09 DIAGNOSIS — M9981 Other biomechanical lesions of cervical region: Secondary | ICD-10-CM | POA: Diagnosis not present

## 2017-02-09 DIAGNOSIS — E119 Type 2 diabetes mellitus without complications: Secondary | ICD-10-CM | POA: Diagnosis not present

## 2017-02-09 DIAGNOSIS — M5412 Radiculopathy, cervical region: Secondary | ICD-10-CM | POA: Diagnosis not present

## 2017-02-10 DIAGNOSIS — M25511 Pain in right shoulder: Secondary | ICD-10-CM | POA: Diagnosis not present

## 2017-02-10 DIAGNOSIS — E1165 Type 2 diabetes mellitus with hyperglycemia: Secondary | ICD-10-CM | POA: Diagnosis not present

## 2017-02-10 DIAGNOSIS — Z789 Other specified health status: Secondary | ICD-10-CM | POA: Diagnosis not present

## 2017-02-10 DIAGNOSIS — G20 Parkinson's disease: Secondary | ICD-10-CM | POA: Diagnosis not present

## 2017-02-10 DIAGNOSIS — Z6822 Body mass index (BMI) 22.0-22.9, adult: Secondary | ICD-10-CM | POA: Diagnosis not present

## 2017-02-10 DIAGNOSIS — Z79899 Other long term (current) drug therapy: Secondary | ICD-10-CM | POA: Diagnosis not present

## 2017-02-10 DIAGNOSIS — M542 Cervicalgia: Secondary | ICD-10-CM | POA: Diagnosis not present

## 2017-02-10 DIAGNOSIS — Z299 Encounter for prophylactic measures, unspecified: Secondary | ICD-10-CM | POA: Diagnosis not present

## 2017-02-15 DIAGNOSIS — M5412 Radiculopathy, cervical region: Secondary | ICD-10-CM | POA: Diagnosis not present

## 2017-02-15 DIAGNOSIS — M9981 Other biomechanical lesions of cervical region: Secondary | ICD-10-CM | POA: Diagnosis not present

## 2017-02-21 ENCOUNTER — Emergency Department (HOSPITAL_COMMUNITY): Payer: Medicare Other

## 2017-02-21 ENCOUNTER — Other Ambulatory Visit: Payer: Self-pay

## 2017-02-21 ENCOUNTER — Inpatient Hospital Stay (HOSPITAL_COMMUNITY)
Admission: EM | Admit: 2017-02-21 | Discharge: 2017-02-23 | DRG: 065 | Disposition: A | Discharge status: Home Health | Payer: Medicare Other | Attending: Family Medicine | Admitting: Family Medicine

## 2017-02-21 ENCOUNTER — Encounter (HOSPITAL_COMMUNITY): Payer: Self-pay | Admitting: Emergency Medicine

## 2017-02-21 ENCOUNTER — Emergency Department: Payer: Medicare Other

## 2017-02-21 DIAGNOSIS — R29706 NIHSS score 6: Secondary | ICD-10-CM | POA: Diagnosis present

## 2017-02-21 DIAGNOSIS — I63233 Cerebral infarction due to unspecified occlusion or stenosis of bilateral carotid arteries: Secondary | ICD-10-CM | POA: Diagnosis not present

## 2017-02-21 DIAGNOSIS — E785 Hyperlipidemia, unspecified: Secondary | ICD-10-CM | POA: Diagnosis present

## 2017-02-21 DIAGNOSIS — I10 Essential (primary) hypertension: Secondary | ICD-10-CM | POA: Diagnosis not present

## 2017-02-21 DIAGNOSIS — I639 Cerebral infarction, unspecified: Secondary | ICD-10-CM | POA: Diagnosis not present

## 2017-02-21 DIAGNOSIS — K219 Gastro-esophageal reflux disease without esophagitis: Secondary | ICD-10-CM | POA: Diagnosis present

## 2017-02-21 DIAGNOSIS — I129 Hypertensive chronic kidney disease with stage 1 through stage 4 chronic kidney disease, or unspecified chronic kidney disease: Secondary | ICD-10-CM | POA: Diagnosis present

## 2017-02-21 DIAGNOSIS — G8191 Hemiplegia, unspecified affecting right dominant side: Secondary | ICD-10-CM | POA: Diagnosis present

## 2017-02-21 DIAGNOSIS — I6522 Occlusion and stenosis of left carotid artery: Secondary | ICD-10-CM

## 2017-02-21 DIAGNOSIS — N179 Acute kidney failure, unspecified: Secondary | ICD-10-CM | POA: Diagnosis not present

## 2017-02-21 DIAGNOSIS — Z951 Presence of aortocoronary bypass graft: Secondary | ICD-10-CM

## 2017-02-21 DIAGNOSIS — R29810 Facial weakness: Secondary | ICD-10-CM | POA: Diagnosis not present

## 2017-02-21 DIAGNOSIS — I63232 Cerebral infarction due to unspecified occlusion or stenosis of left carotid arteries: Principal | ICD-10-CM | POA: Diagnosis present

## 2017-02-21 DIAGNOSIS — E7800 Pure hypercholesterolemia, unspecified: Secondary | ICD-10-CM

## 2017-02-21 DIAGNOSIS — R4701 Aphasia: Secondary | ICD-10-CM | POA: Diagnosis present

## 2017-02-21 DIAGNOSIS — I2510 Atherosclerotic heart disease of native coronary artery without angina pectoris: Secondary | ICD-10-CM | POA: Diagnosis present

## 2017-02-21 DIAGNOSIS — Z7984 Long term (current) use of oral hypoglycemic drugs: Secondary | ICD-10-CM | POA: Diagnosis not present

## 2017-02-21 DIAGNOSIS — I6789 Other cerebrovascular disease: Secondary | ICD-10-CM | POA: Diagnosis not present

## 2017-02-21 DIAGNOSIS — I252 Old myocardial infarction: Secondary | ICD-10-CM | POA: Diagnosis not present

## 2017-02-21 DIAGNOSIS — E119 Type 2 diabetes mellitus without complications: Secondary | ICD-10-CM | POA: Diagnosis present

## 2017-02-21 DIAGNOSIS — R531 Weakness: Secondary | ICD-10-CM | POA: Diagnosis not present

## 2017-02-21 DIAGNOSIS — R29818 Other symptoms and signs involving the nervous system: Secondary | ICD-10-CM | POA: Diagnosis not present

## 2017-02-21 DIAGNOSIS — N183 Chronic kidney disease, stage 3 (moderate): Secondary | ICD-10-CM | POA: Diagnosis present

## 2017-02-21 DIAGNOSIS — J449 Chronic obstructive pulmonary disease, unspecified: Secondary | ICD-10-CM | POA: Diagnosis not present

## 2017-02-21 DIAGNOSIS — R200 Anesthesia of skin: Secondary | ICD-10-CM | POA: Diagnosis not present

## 2017-02-21 DIAGNOSIS — I6501 Occlusion and stenosis of right vertebral artery: Secondary | ICD-10-CM | POA: Diagnosis not present

## 2017-02-21 LAB — I-STAT CHEM 8, ED
BUN: 24 mg/dL — ABNORMAL HIGH (ref 6–20)
Calcium, Ion: 1.16 mmol/L (ref 1.15–1.40)
Chloride: 105 mmol/L (ref 101–111)
Creatinine, Ser: 1.3 mg/dL — ABNORMAL HIGH (ref 0.61–1.24)
Glucose, Bld: 135 mg/dL — ABNORMAL HIGH (ref 65–99)
HCT: 36 % — ABNORMAL LOW (ref 39.0–52.0)
Hemoglobin: 12.2 g/dL — ABNORMAL LOW (ref 13.0–17.0)
Potassium: 4.5 mmol/L (ref 3.5–5.1)
Sodium: 139 mmol/L (ref 135–145)
TCO2: 23 mmol/L (ref 22–32)

## 2017-02-21 LAB — CBC
HCT: 36.3 % — ABNORMAL LOW (ref 39.0–52.0)
HCT: 33.1 % — ABNORMAL LOW (ref 39.0–52.0)
Hemoglobin: 12 g/dL — ABNORMAL LOW (ref 13.0–17.0)
Hemoglobin: 11 g/dL — ABNORMAL LOW (ref 13.0–17.0)
MCH: 30.6 pg (ref 26.0–34.0)
MCH: 30.1 pg (ref 26.0–34.0)
MCHC: 33.1 g/dL (ref 30.0–36.0)
MCHC: 33.2 g/dL (ref 30.0–36.0)
MCV: 92.6 fL (ref 78.0–100.0)
MCV: 90.4 fL (ref 78.0–100.0)
Platelets: 170 10*3/uL (ref 150–400)
Platelets: 146 10*3/uL — ABNORMAL LOW (ref 150–400)
RBC: 3.92 MIL/uL — ABNORMAL LOW (ref 4.22–5.81)
RBC: 3.66 MIL/uL — ABNORMAL LOW (ref 4.22–5.81)
RDW: 14.5 % (ref 11.5–15.5)
RDW: 14.2 % (ref 11.5–15.5)
WBC: 7.3 10*3/uL (ref 4.0–10.5)
WBC: 8.1 10*3/uL (ref 4.0–10.5)

## 2017-02-21 LAB — URINALYSIS, ROUTINE W REFLEX MICROSCOPIC
Bilirubin Urine: NEGATIVE
Glucose, UA: NEGATIVE mg/dL
Hgb urine dipstick: NEGATIVE
Ketones, ur: NEGATIVE mg/dL
Leukocytes, UA: NEGATIVE
Nitrite: NEGATIVE
Protein, ur: NEGATIVE mg/dL
Specific Gravity, Urine: 1.032 — ABNORMAL HIGH (ref 1.005–1.030)
pH: 6 (ref 5.0–8.0)

## 2017-02-21 LAB — COMPREHENSIVE METABOLIC PANEL
ALT: 8 U/L — ABNORMAL LOW (ref 17–63)
AST: 16 U/L (ref 15–41)
Albumin: 3.9 g/dL (ref 3.5–5.0)
Alkaline Phosphatase: 76 U/L (ref 38–126)
Anion gap: 11 (ref 5–15)
BUN: 25 mg/dL — ABNORMAL HIGH (ref 6–20)
CO2: 21 mmol/L — ABNORMAL LOW (ref 22–32)
Calcium: 9.3 mg/dL (ref 8.9–10.3)
Chloride: 104 mmol/L (ref 101–111)
Creatinine, Ser: 1.4 mg/dL — ABNORMAL HIGH (ref 0.61–1.24)
GFR calc Af Amer: 48 mL/min — ABNORMAL LOW (ref >60)
GFR calc non Af Amer: 42 mL/min — ABNORMAL LOW (ref >60)
Glucose, Bld: 139 mg/dL — ABNORMAL HIGH (ref 65–99)
Potassium: 4.5 mmol/L (ref 3.5–5.1)
Sodium: 136 mmol/L (ref 135–145)
Total Bilirubin: 1 mg/dL (ref 0.3–1.2)
Total Protein: 6.8 g/dL (ref 6.5–8.1)

## 2017-02-21 LAB — CREATININE, SERUM
Creatinine, Ser: 1.3 mg/dL — ABNORMAL HIGH (ref 0.61–1.24)
GFR calc Af Amer: 53 mL/min — ABNORMAL LOW (ref >60)
GFR calc non Af Amer: 46 mL/min — ABNORMAL LOW (ref >60)

## 2017-02-21 LAB — DIFFERENTIAL
Basophils Absolute: 0 10*3/uL (ref 0.0–0.1)
Basophils Relative: 0 %
Eosinophils Absolute: 0.1 10*3/uL (ref 0.0–0.7)
Eosinophils Relative: 1 %
Lymphocytes Relative: 27 %
Lymphs Abs: 1.9 10*3/uL (ref 0.7–4.0)
Monocytes Absolute: 0.6 10*3/uL (ref 0.1–1.0)
Monocytes Relative: 8 %
Neutro Abs: 4.7 10*3/uL (ref 1.7–7.7)
Neutrophils Relative %: 64 %

## 2017-02-21 LAB — GLUCOSE, CAPILLARY
Glucose-Capillary: 101 mg/dL — ABNORMAL HIGH (ref 65–99)
Glucose-Capillary: 149 mg/dL — ABNORMAL HIGH (ref 65–99)

## 2017-02-21 LAB — RAPID URINE DRUG SCREEN, HOSP PERFORMED
Amphetamines: NOT DETECTED
Barbiturates: NOT DETECTED
Benzodiazepines: NOT DETECTED
Cocaine: NOT DETECTED
Opiates: NOT DETECTED
Tetrahydrocannabinol: NOT DETECTED

## 2017-02-21 LAB — CBG MONITORING, ED
Glucose-Capillary: 120 mg/dL — ABNORMAL HIGH (ref 65–99)
Glucose-Capillary: 124 mg/dL — ABNORMAL HIGH (ref 65–99)

## 2017-02-21 LAB — APTT: aPTT: 27 seconds (ref 24–36)

## 2017-02-21 LAB — ETHANOL: Alcohol, Ethyl (B): <10 mg/dL (ref <10)

## 2017-02-21 LAB — I-STAT TROPONIN, ED: Troponin i, poc: 0.01 ng/mL (ref 0.00–0.08)

## 2017-02-21 LAB — PROTIME-INR
INR: 0.95
Prothrombin Time: 12.6 seconds (ref 11.4–15.2)

## 2017-02-21 MED ORDER — GABAPENTIN 100 MG PO CAPS
100.0000 mg | ORAL_CAPSULE | Freq: Every day | ORAL | Status: DC
  Administered 2017-02-21 – 2017-02-22 (×2): 100 mg via ORAL
  Filled 2017-02-21 (×2): qty 1

## 2017-02-21 MED ORDER — ATORVASTATIN CALCIUM 80 MG PO TABS
80.0000 mg | ORAL_TABLET | Freq: Every day | ORAL | Status: DC
  Administered 2017-02-21 – 2017-02-22 (×2): 80 mg via ORAL
  Filled 2017-02-21 (×2): qty 1

## 2017-02-21 MED ORDER — PANTOPRAZOLE SODIUM 40 MG PO TBEC
40.0000 mg | DELAYED_RELEASE_TABLET | Freq: Every day | ORAL | Status: DC
  Administered 2017-02-21 – 2017-02-23 (×3): 40 mg via ORAL
  Filled 2017-02-21 (×3): qty 1

## 2017-02-21 MED ORDER — CARBIDOPA-LEVODOPA ER 25-100 MG PO TBCR
1.0000 | EXTENDED_RELEASE_TABLET | Freq: Three times a day (TID) | ORAL | Status: DC
  Administered 2017-02-21 – 2017-02-23 (×7): 1 via ORAL
  Filled 2017-02-21 (×10): qty 1

## 2017-02-21 MED ORDER — STROKE: EARLY STAGES OF RECOVERY BOOK
Freq: Once | Status: AC
  Administered 2017-02-21: 21:00:00

## 2017-02-21 MED ORDER — POLYETHYLENE GLYCOL 3350 17 G PO PACK
1.0000 | PACK | Freq: Every day | ORAL | Status: DC
  Administered 2017-02-21: 17 g via ORAL
  Filled 2017-02-21 (×3): qty 1

## 2017-02-21 MED ORDER — ACETAMINOPHEN 160 MG/5ML PO SOLN: 650.0000 mg | ORAL | Status: DC | PRN

## 2017-02-21 MED ORDER — ENOXAPARIN SODIUM 40 MG/0.4ML ~~LOC~~ SOLN
40.0000 mg | SUBCUTANEOUS | Status: DC
  Administered 2017-02-21 – 2017-02-22 (×2): 40 mg via SUBCUTANEOUS
  Filled 2017-02-21 (×2): qty 0.4

## 2017-02-21 MED ORDER — IPRATROPIUM BROMIDE 0.06 % NA SOLN
1.0000 | Freq: Every day | NASAL | Status: DC
  Filled 2017-02-21: qty 15

## 2017-02-21 MED ORDER — CARBIDOPA-LEVODOPA 25-100 MG PO TABS
1.0000 | ORAL_TABLET | Freq: Three times a day (TID) | ORAL | Status: DC
  Administered 2017-02-21 – 2017-02-23 (×7): 1 via ORAL
  Filled 2017-02-21 (×7): qty 1

## 2017-02-21 MED ORDER — ACETAMINOPHEN 325 MG PO TABS: 650.0000 mg | ORAL_TABLET | ORAL | Status: DC | PRN

## 2017-02-21 MED ORDER — LORATADINE 10 MG PO TABS
10.0000 mg | ORAL_TABLET | Freq: Every day | ORAL | Status: DC
  Administered 2017-02-22 – 2017-02-23 (×2): 10 mg via ORAL
  Filled 2017-02-21 (×2): qty 1

## 2017-02-21 MED ORDER — METFORMIN HCL 500 MG PO TABS
500.0000 mg | ORAL_TABLET | Freq: Three times a day (TID) | ORAL | Status: DC
  Administered 2017-02-22 (×2): 500 mg via ORAL
  Filled 2017-02-21 (×2): qty 1

## 2017-02-21 MED ORDER — ASPIRIN 325 MG PO TABS
325.0000 mg | ORAL_TABLET | Freq: Every day | ORAL | Status: DC
  Administered 2017-02-21 – 2017-02-23 (×3): 325 mg via ORAL
  Filled 2017-02-21 (×3): qty 1

## 2017-02-21 MED ORDER — TAMSULOSIN HCL 0.4 MG PO CAPS
0.4000 mg | ORAL_CAPSULE | Freq: Every day | ORAL | Status: DC
  Administered 2017-02-21 – 2017-02-23 (×3): 0.4 mg via ORAL
  Filled 2017-02-21 (×3): qty 1

## 2017-02-21 MED ORDER — CARBIDOPA-LEVODOPA ER 25-100 MG PO TBCR: 4.0000 | EXTENDED_RELEASE_TABLET | Freq: Four times a day (QID) | ORAL | Status: DC

## 2017-02-21 MED ORDER — TRAMADOL HCL 50 MG PO TABS
50.0000 mg | ORAL_TABLET | Freq: Four times a day (QID) | ORAL | Status: DC | PRN
  Administered 2017-02-21 – 2017-02-23 (×3): 50 mg via ORAL
  Filled 2017-02-21 (×3): qty 1

## 2017-02-21 MED ORDER — IOPAMIDOL (ISOVUE-370) INJECTION 76%
100.0000 mL | Freq: Once | INTRAVENOUS | Status: AC | PRN
  Administered 2017-02-21: 100 mL via INTRAVENOUS

## 2017-02-21 MED ORDER — CLOPIDOGREL BISULFATE 75 MG PO TABS
75.0000 mg | ORAL_TABLET | Freq: Every day | ORAL | Status: DC
  Administered 2017-02-22 – 2017-02-23 (×2): 75 mg via ORAL
  Filled 2017-02-21 (×2): qty 1

## 2017-02-21 MED ORDER — CARBIDOPA-LEVODOPA 25-100 MG PO TABS: 1.0000 | ORAL_TABLET | Freq: Three times a day (TID) | ORAL | Status: DC

## 2017-02-21 MED ORDER — SODIUM CHLORIDE 0.9 % IV SOLN
INTRAVENOUS | Status: DC
  Administered 2017-02-21 – 2017-02-23 (×3): via INTRAVENOUS

## 2017-02-21 MED ORDER — BENAZEPRIL HCL 5 MG PO TABS
5.0000 mg | ORAL_TABLET | Freq: Every day | ORAL | Status: DC
  Administered 2017-02-22: 5 mg via ORAL
  Filled 2017-02-21: qty 1

## 2017-02-21 MED ORDER — DUTASTERIDE 0.5 MG PO CAPS
0.5000 mg | ORAL_CAPSULE | Freq: Every day | ORAL | Status: DC
  Administered 2017-02-22 – 2017-02-23 (×2): 0.5 mg via ORAL
  Filled 2017-02-21 (×2): qty 1

## 2017-02-21 MED ORDER — NABUMETONE 500 MG PO TABS
500.0000 mg | ORAL_TABLET | Freq: Two times a day (BID) | ORAL | Status: DC
  Administered 2017-02-21 – 2017-02-23 (×4): 500 mg via ORAL
  Filled 2017-02-21 (×5): qty 1

## 2017-02-21 MED ORDER — ASPIRIN 300 MG RE SUPP: 300.0000 mg | Freq: Every day | RECTAL | Status: DC

## 2017-02-21 MED ORDER — FINASTERIDE 5 MG PO TABS
5.0000 mg | ORAL_TABLET | Freq: Every day | ORAL | Status: DC
  Administered 2017-02-22 – 2017-02-23 (×2): 5 mg via ORAL
  Filled 2017-02-21 (×2): qty 1

## 2017-02-21 MED ORDER — SENNOSIDES-DOCUSATE SODIUM 8.6-50 MG PO TABS
1.0000 | ORAL_TABLET | Freq: Every evening | ORAL | Status: DC | PRN
Start: 2017-02-21 — End: 2017-02-23

## 2017-02-21 MED ORDER — ACETAMINOPHEN 650 MG RE SUPP: 650.0000 mg | RECTAL | Status: DC | PRN

## 2017-02-21 MED ORDER — CARBIDOPA-LEVODOPA 10-100 MG PO TABS: 1.0000 | ORAL_TABLET | Freq: Three times a day (TID) | ORAL | Status: DC

## 2017-02-21 MED ORDER — EZETIMIBE 10 MG PO TABS
10.0000 mg | ORAL_TABLET | Freq: Every day | ORAL | Status: DC
  Administered 2017-02-22: 10 mg via ORAL
  Filled 2017-02-21 (×2): qty 1

## 2017-02-21 MED ORDER — GABAPENTIN 100 MG PO CAPS: 100.0000 mg | ORAL_CAPSULE | ORAL | Status: DC

## 2017-02-21 MED ORDER — METOPROLOL SUCCINATE ER 25 MG PO TB24
25.0000 mg | ORAL_TABLET | Freq: Every day | ORAL | Status: DC
  Administered 2017-02-21 – 2017-02-22 (×2): 25 mg via ORAL
  Filled 2017-02-21 (×2): qty 1

## 2017-02-21 MED ORDER — MORPHINE SULFATE (PF) 2 MG/ML IV SOLN
0.5000 mg | INTRAVENOUS | Status: DC | PRN
  Administered 2017-02-21 – 2017-02-22 (×2): 0.5 mg via INTRAVENOUS
  Filled 2017-02-21 (×2): qty 1

## 2017-02-21 NOTE — ED Notes (Signed)
Tramadol, and sinemet given by daughter. MD aware.

## 2017-02-21 NOTE — ED Triage Notes (Signed)
Pt states woke up this am unable to get out of bed, right leg numbness and right arm numbness. Went to bed around 830pm last night. On arrival pt numbness subsided in right leg. Pt has been receiving injections in neck and back last one was approx 3 weeks ago per pt. Pt is alert and oriented X4

## 2017-02-21 NOTE — ED Notes (Signed)
Report given to [facility] with [facility].

## 2017-02-21 NOTE — Consult Note (Signed)
Patient name: Victor Bell MRN: [id number] DOB: [birth year] Sex: male   REASON FOR CONSULT:    Right brain stroke with right carotid stenosis.  Consult is requested by [facility].   HPI:   Victor Bell is a pleasant 84 y.o. male, who awoke early this morning and on his way to the bathroom noted that he was dragging the right foot.  He also had difficulty using the right arm.  He was taken to [facility] and MRI there confirmed an acute left brain stroke.  CT angiogram of the head and neck showed a high-grade stenosis of the left internal carotid artery.  Patient is transferred to the [facility] and vascular surgery is consulted.  He has some expressive aphasia and it is somewhat difficult to get his history but he states that the weakness on the right side has improved.  He also complains of some paresthesias in the bottom of his right foot.  The patient denies any previous history of stroke, TIAs, expressive or receptive aphasia, or amaurosis fugax.  His main complaint is right shoulder pain.  Apparently he has been evaluated for this in the past and has spurs.  He is currently on aspirin and a statin.  Past Medical History:  Diagnosis Date  . Arthritis   . Asthma   . CAD (coronary artery disease)   . Cancer ([place])    skin   . Carotid artery occlusion   . CHF (congestive heart failure) ([place])   . Colon polyp   . COPD (chronic obstructive pulmonary disease) ([place])   . Diabetes mellitus   . GERD (gastroesophageal reflux disease)   . Heart disease   . Hyperlipidemia   . Hypertension   . Myocardial infarction ([place])    1998    Family History  Problem Relation Age of Onset  . Heart disease Mother   . Aneurysm Sister     SOCIAL HISTORY: Social History   Socioeconomic History  . Marital status: Married    Spouse name: Not on file  . Number of children: 1  . Years of education: 76  . Highest education level: Not on file  Social Needs  . Financial  resource strain: Not on file  . Food insecurity - worry: Not on file  . Food insecurity - inability: Not on file  . Transportation needs - medical: Not on file  . Transportation needs - non-medical: Not on file  Occupational History  . Occupation: Retired  Tobacco Use  . Smoking status: Never Smoker  . Smokeless tobacco: Never Used  Substance and Sexual Activity  . Alcohol use: No    Alcohol/week: 0.0 oz  . Drug use: No  . Sexual activity: Not on file  Other Topics Concern  . Not on file  Social History Narrative   Drinks coffee in the morning     Allergies  Allergen Reactions  . Niacin And Related Other (See Comments)    Burning     Current Facility-Administered Medications  Medication Dose Route Frequency Provider Last Rate Last Dose  . 0.9 %  sodium chloride infusion   Intravenous Continuous Isaac Bliss, Rayford Halsted, MD 75 mL/hr at 02/21/17 2315    . acetaminophen (TYLENOL) tablet 650 mg  650 mg Oral Q4H PRN Isaac Bliss, Rayford Halsted, MD       Or  . acetaminophen (TYLENOL) solution 650 mg  650 mg Per Tube Q4H PRN Isaac Bliss, Rayford Halsted, MD  Or  . acetaminophen (TYLENOL) suppository 650 mg  650 mg Rectal Q4H PRN Isaac Bliss, Rayford Halsted, MD      . aspirin suppository 300 mg  300 mg Rectal Daily Isaac Bliss, Rayford Halsted, MD       Or  . aspirin tablet 325 mg  325 mg Oral Daily Isaac Bliss, Rayford Halsted, MD   325 mg at 02/21/17 2215  . atorvastatin (LIPITOR) tablet 80 mg  80 mg Oral q1800 Isaac Bliss, Rayford Halsted, MD   80 mg at 02/21/17 2109  . [START ON 02/22/2017] benazepril (LOTENSIN) tablet 5 mg  5 mg Oral Daily Isaac Bliss, Rayford Halsted, MD      . carbidopa-levodopa (SINEMET IR) 25-100 MG per tablet immediate release 1 tablet  1 tablet Oral TID WC & HS Isaac Bliss, Rayford Halsted, MD   1 tablet at 02/21/17 2109  . Carbidopa-Levodopa ER (SINEMET CR) 25-100 MG tablet controlled release 1 tablet  1 tablet Oral TID WC & HS Isaac Bliss, Rayford Halsted, MD   1  tablet at 02/21/17 2212  . [START ON 02/22/2017] dutasteride (AVODART) capsule 0.5 mg  0.5 mg Oral Daily Isaac Bliss, Rayford Halsted, MD      . enoxaparin (LOVENOX) injection 40 mg  40 mg Subcutaneous Q24H Isaac Bliss, Rayford Halsted, MD   40 mg at 02/21/17 2109  . [START ON 02/22/2017] ezetimibe (ZETIA) tablet 10 mg  10 mg Oral Daily Isaac Bliss, Rayford Halsted, MD      . Derrill Memo ON 02/22/2017] finasteride (PROSCAR) tablet 5 mg  5 mg Oral Daily Isaac Bliss, Rayford Halsted, MD      . gabapentin (NEURONTIN) capsule 100 mg  100 mg Oral QHS Isaac Bliss, Rayford Halsted, MD   100 mg at 02/21/17 2109  . [START ON 02/22/2017] ipratropium (ATROVENT) 0.06 % nasal spray 1 spray  1 spray Nasal Daily Isaac Bliss, Rayford Halsted, MD      . Derrill Memo ON 02/22/2017] loratadine (CLARITIN) tablet 10 mg  10 mg Oral Daily Isaac Bliss, Rayford Halsted, MD      . Derrill Memo ON 02/22/2017] metFORMIN (GLUCOPHAGE) tablet 500 mg  500 mg Oral TID WC Isaac Bliss, Rayford Halsted, MD      . metoprolol succinate (TOPROL-XL) 24 hr tablet 25 mg  25 mg Oral Daily Isaac Bliss, Rayford Halsted, MD   25 mg at 02/21/17 2307  . morphine 2 MG/ML injection 0.5 mg  0.5 mg Intravenous Q3H PRN Isaac Bliss, Rayford Halsted, MD   0.5 mg at 02/21/17 1654  . nabumetone (RELAFEN) tablet 500 mg  500 mg Oral BID Isaac Bliss, Rayford Halsted, MD   500 mg at 02/21/17 2108  . pantoprazole (PROTONIX) EC tablet 40 mg  40 mg Oral Daily Isaac Bliss, Rayford Halsted, MD   40 mg at 02/21/17 2212  . polyethylene glycol (MIRALAX / GLYCOLAX) packet 17 g  1 packet Oral Daily Isaac Bliss, Rayford Halsted, MD   17 g at 02/21/17 2307  . senna-docusate (Senokot-S) tablet 1 tablet  1 tablet Oral QHS PRN Isaac Bliss, Rayford Halsted, MD      . tamsulosin [facility]) capsule 0.4 mg  0.4 mg Oral Daily Isaac Bliss, Rayford Halsted, MD   0.4 mg at 02/21/17 2307  . traMADol (ULTRAM) tablet 50 mg  50 mg Oral Q6H PRN Isaac Bliss, Rayford Halsted, MD   50 mg at 02/21/17 2109    REVIEW OF SYSTEMS:  [X]  denotes  positive finding, [ ]  denotes negative finding Cardiac  Comments:  Chest pain or chest pressure:    Shortness of breath upon exertion: x   Short of breath when lying flat:    Irregular heart rhythm:        Vascular    Pain in calf, thigh, or hip brought on by ambulation:    Pain in feet at night that wakes you up from your sleep:     Blood clot in your veins:    Leg swelling:         Pulmonary    Oxygen at home:    Productive cough:     Wheezing:         Neurologic    Sudden weakness in arms or legs:  x   Sudden numbness in arms or legs:  x   Sudden onset of difficulty speaking or slurred speech:    Temporary loss of vision in one eye:     Problems with dizziness:         Gastrointestinal    Blood in stool:     Vomited blood:         Genitourinary    Burning when urinating:     Blood in urine:        Psychiatric    Major depression:         Hematologic    Bleeding problems:    Problems with blood clotting too easily:        Skin    Rashes or ulcers:        Constitutional    Fever or chills:     PHYSICAL EXAM:   Vitals:   02/21/17 1742 02/21/17 1800 02/21/17 1925 02/21/17 2125  BP: (!) 152/85 129/83 (!) 156/87 140/64  Pulse: 90  77 69  Resp: 18 16 20 18   Temp:   98.3 F (36.8 C) 97.8 F (36.6 C)  TempSrc:   Oral Oral  SpO2: 97%  97% 99%  Weight:   144 lb 6.4 oz (65.5 kg)   Height:   5\' 7"  (1.702 m)     GENERAL: The patient is a well-nourished male, in no acute distress. The vital signs are documented above. CARDIAC: There is a regular rate and rhythm.  VASCULAR: I do not detect carotid bruits. He has palpable femoral, popliteal, dorsalis pedis pulses bilaterally. He has no significant lower extremity swelling. PULMONARY: There is good air exchange bilaterally without wheezing or rales. ABDOMEN: Soft and non-tender with normal pitched bowel sounds.  MUSCULOSKELETAL: There are no major deformities or cyanosis. NEUROLOGIC: He has 3-4 out of 5 strength  in his upper extremity and lower extremity on the right.  He has no significant left sided weakness.  He also has some expressive aphasia. SKIN: There are no ulcers or rashes noted. PSYCHIATRIC: The patient has a normal affect.  DATA:    MRI OF THE BRAIN: MRI of the brain on 02/21/2017 shows a 1 cm acute infarct in the left posterior centrum semiovale.  CT ANGIOGRAM OF THE NECK: CT angiogram of the neck on 02/21/2017 shows a tight left internal carotid artery stenosis.  Stenosis in the left carotid artery extends over a fairly long length with some tortuosity in the internal carotid artery just beyond the stenosis.  There is mild bilateral ICA siphon stenoses.  There is no significant stenosis on the right although there is a calcific plaque noted.  LABS: Creatinine is 1.3.  GFR is 46.  Platelet count 146,000.  Hemoglobin 11.  White count 8.1.  Coags are  normal.   MEDICAL ISSUES:   SYMPTOMATIC LEFT CAROTID STENOSIS: This patient has had a left brain stroke and has a significant left carotid stenosis.  He has persistent weakness on the right side.  The patient is 84 years old and has significant coronary artery disease and COPD.  His cardiologist is Dr. Wynonia Lawman.  I will have Dr. Oneida Alar or Dr. Trula Slade review his CT angiogram to determine if he might be a candidate for a carotid stent.  If he is not a candidate for a carotid stent I think he would be at high risk for surgery given his age and multiple medical comorbidities.  I think it would be worth considering maximum medical therapy prior to proceeding with carotid endarterectomy is not a candidate for a stent.  This would likely include adding Plavix.  Given that he would need to be on Plavix if he had a carotid stent I have ordered Plavix.   Deitra Mayo [facility] [phone]

## 2017-02-21 NOTE — ED Notes (Signed)
Report given to Ginger,RN at this time [facility]

## 2017-02-21 NOTE — Consult Note (Addendum)
   [facility] TeleNeurology Consult Services  Impression: Stroke, left hemispheric   Not a tpa candidate due to: out of therapeutic window Imaging ordered to r/o lvo   Differential Diagnosis:   1. Cardioembolic stroke  2. Small vessel disease/lacune  3. Thromboembolic, artery-to-artery mechanism  4. Hypercoagulable state-related infarct  5. Transient ischemic attack  6. Thrombotic mechanism, large artery disease   Comments:   [facility] contacted: 1131 [facility] at bedside: 1139  Recommendations:  If cta head and neck and perfusion do not show any thrombus. Patient can be admitted. Will follow admit stroke/telemetry neuro checks dvt prophy dysphagia screen asa if no contraindications head of bed flat iv fluids ns  inpatient neurology consultation Inpatient stroke evaluation as per Neurology/ Internal Medicine Discussed with ED MD  Addendum: Symmetric perfusion No lvo on ct angio of head Severely stenotic left ica. Discussed with ed physician. Vascular surgery consult plavix 75mg  pa and asa 81mg  po -----------------------------------------------------------------------------------------  CC right sided weakness  History of Present Illness   Patient is a  84 y.o. man who comes to the hospital for right sided weakness. The last time he felt normal was last night before he went to sleep around 2030. He has been off asa for the past week. He has right shoulder pain, but feels his right leg is weak and numb.  Vitals:   02/21/17 1130 02/21/17 1200  BP: (!) 193/85 (!) 159/84  Pulse: 78 74  Resp: 14 12  SpO2: 97% 97%    Diagnostic: Ct head without contrast: no acute findings per rad read.  Exam:  NIHSS score: 6  1A: Level of Consciousness - Alert; keenly responsive 1B: Ask Month and Age - Both Questions Right 1C: 'Blink Eyes' & 'Squeeze Hands' - Performs Both Tasks 2: Test Horizontal Extraocular Movements - Normal 3: Test Visual Fields - No  Visual Loss 4: Test Facial Palsy - Minor paralysis (flat nasolabial fold, smile asymetry) 5A: Test Left Arm Motor Drift - No Drift for 10 Seconds 5B: Test Right Arm Motor Drift - Drift, hits bed 6A: Test Left Leg Motor Drift - No Drift for 5 Seconds 6B: Test Right Leg Motor Drift - Drift, hits bed 7: Test Limb Ataxia - No Ataxia 8: Test Sensation - Mild-Moderate Loss: Less Sharp/More Dull 9: Test Language/Aphasia - Normal; No aphasia 10: Test Dysarthria - Normal 11: Test Extinction/Inattention - No abnormality      Medical Decision Making:  - Extensive number of diagnosis or management options are considered above.   - Extensive amount of complex data reviewed.   - High risk of complication and/or morbidity or mortality are associated with differential diagnostic considerations above.  - There may be Uncertain outcome and increased probability of prolonged functional impairment or high probability of severe prolonged functional impairment associated with some of these differential diagnosis.  Medical Data Reviewed:  1.Data reviewed include clinical labs, radiology,  Medical Tests;   2.Tests results discussed w/performing or interpreting physician;   3.Obtaining/reviewing old medical records;  4.Obtaining case history from another source;  5.Independent review of image, tracing or specimen.    Patient was informed the Neurology Consult would happen via telehealth (remote video) and consented to receiving care in this manner.

## 2017-02-21 NOTE — Progress Notes (Signed)
CODE STROKE 1100 CALL/PATIENT NOT ARRIVED YET 1112 CALL PATIENT ARRIVAL 1115 BEEPER TIME 1116 EXAM STARTED 1117 EXAM FINISHED 1117 IMAGES SENT TO SOC 1127 EXAM COMPLETED IN EPIC 1128 [facility] CALLED

## 2017-02-21 NOTE — ED Notes (Signed)
Patient transported to MRI 

## 2017-02-21 NOTE — ED Notes (Signed)
Pt LKN was 02-20-17 at 830pm. Pt woke up this am around 7am but didn't try to get out of bed until 8am when he noticed he couldn't move his right arm or leg and they were "heavy and numb." pt arrived to ED 1114am, Code Stroke called at 11:15 per Dr.Long. Neurologist responded via [facility] at 11:39.

## 2017-02-21 NOTE — H&P (Signed)
History and Physical    Victor Bell ZDG:[id number] DOB: April 04, 1922 DOA: 02/21/2017  Referring MD/NP/PA: Nanda Quinton, EDP PCP: Glenda Chroman, MD  Patient coming from: Home  Chief Complaint: Right-sided weakness  HPI: Victor Bell is a 84 y.o. male who was last seen normal last night before he went to bed.  Woke up this morning and on his way to the bathroom had to hold onto his wife because his right foot was dragging on the floor, he also had difficulty using his right arm.  He asked his wife to bring him to the hospital because he was sure he was having a stroke.  In the ED vital signs were stable, may be slightly hypertensive, labs are remarkable for creatinine of 1.4, no prior to compare.  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  CT angiogram of the head and neck is negative for emergent large vessel occlusion but is positive for a high-grade waiter graphic string sign stenosis of the left ICA bulb due to bulky  calcified plaque.  I have asked EDP to run this by vascular surgery.  He spoke with Dr. Scot Dock, who recommends transfer to [facility] to be evaluated by vascular surgery.  Admission requested.  Past Medical/Surgical History: Past Medical History:  Diagnosis Date  . Arthritis   . Asthma   . CAD (coronary artery disease)   . Cancer ([place])    skin   . Carotid artery occlusion   . CHF (congestive heart failure) ([place])   . Colon polyp   . COPD (chronic obstructive pulmonary disease) ([place])   . Diabetes mellitus   . GERD (gastroesophageal reflux disease)   . Heart disease   . Hyperlipidemia   . Hypertension   . Myocardial infarction ([employer])    1998    Past Surgical History:  Procedure Laterality Date  . CATARACT EXTRACTION    . CHOLECYSTECTOMY     Gall bladder  . CORONARY ARTERY BYPASS GRAFT  10/03/96   x7  . TONSILLECTOMY      Social History:  reports that  has never smoked. he has never used smokeless tobacco. He reports  that he does not drink alcohol or use drugs.  Allergies: Allergies  Allergen Reactions  . Niacin And Related Other (See Comments)    Burning     Family History:  Family History  Problem Relation Age of Onset  . Heart disease Mother   . Aneurysm Sister     Prior to Admission medications   Medication Sig Start Date End Date Taking? Authorizing Provider  atorvastatin (LIPITOR) 80 MG tablet Take 80 mg by mouth daily.     Yes [provider]  benazepril (LOTENSIN) 5 MG tablet Take 5 mg by mouth daily.  01/28/15  Yes [provider]  carbidopa-levodopa (SINEMET IR) 10-100 MG tablet Take 1 tablet by mouth 3 (three) times daily.   Yes [provider]  carbidopa-levodopa (SINEMET IR) 25-100 MG tablet Take 1 tablet by mouth 3 (three) times daily. Pt takes carbidopa-levodopa extended release and other at same time.   Yes [provider]  Carbidopa-Levodopa ER (SINEMET CR) 25-100 MG tablet controlled release Take 4 tablets by mouth 4 (four) times daily. 07/29/16  Yes [provider]  cetirizine (ZYRTEC) 10 MG tablet Take 10 mg by mouth daily.   Yes [provider]  dutasteride (AVODART) 0.5 MG capsule Take 0.5 mg by mouth daily.     Yes [provider]  ezetimibe (ZETIA) 10 MG tablet Take 10 mg by mouth daily.   Yes [provider]  finasteride (PROSCAR) 5 MG tablet Take 5 mg by mouth daily.  02/16/15  Yes [provider]  gabapentin (NEURONTIN) 100 MG capsule Take 100 mg by mouth 1 day or 1 dose.   Yes [provider]  ipratropium (ATROVENT) 0.06 % nasal spray Place 1 spray into the nose daily.  01/28/15  Yes [provider]  linaclotide (LINZESS) 145 MCG CAPS capsule Take 145 mcg by mouth daily before breakfast.   Yes [provider]  metFORMIN (GLUCOPHAGE) 500 MG tablet Take 500 mg by mouth 3 (three) times daily.    Yes [provider]  metoprolol (TOPROL-XL) 50 MG 24 hr tablet Take 25  mg by mouth daily.    Yes [provider]  nabumetone (RELAFEN) 500 MG tablet Take 500 mg by mouth 2 (two) times daily.     Yes [provider]  omeprazole (PRILOSEC) 40 MG capsule Take 40 mg by mouth daily.  01/28/15  Yes [provider]  polyethylene glycol powder (GLYCOLAX/MIRALAX) powder Take 1 Container by mouth daily.  01/28/15  Yes [provider]  Tamsulosin HCl (FLOMAX) 0.4 MG CAPS Take 0.4 mg by mouth daily.     Yes [provider]  traMADol Veatrice Bourbon) 50 MG tablet  12/26/11  Yes [provider]    Review of Systems:  Constitutional: Denies fever, chills, diaphoresis, appetite change and fatigue.  HEENT: Denies photophobia, eye pain, redness, hearing loss, ear pain, congestion, sore throat, rhinorrhea, sneezing, mouth sores, trouble swallowing, neck pain, neck stiffness and tinnitus.   Respiratory: Denies SOB, DOE, cough, chest tightness,  and wheezing.   Cardiovascular: Denies chest pain, palpitations and leg swelling.  Gastrointestinal: Denies nausea, vomiting, abdominal pain, diarrhea, constipation, blood in stool and abdominal distention.  Genitourinary: Denies dysuria, urgency, frequency, hematuria, flank pain and difficulty urinating.  Endocrine: Denies: hot or cold intolerance, sweats, changes in hair or nails, polyuria, polydipsia. Musculoskeletal: Denies myalgias, back pain, joint swelling, arthralgias and gait problem.  Skin: Denies pallor, rash and wound.  Neurological: Denies dizziness, seizures, syncope, light-headedness and headaches.  Hematological: Denies adenopathy. Easy bruising, personal or family bleeding history  Psychiatric/Behavioral: Denies suicidal ideation, mood changes, confusion, nervousness, sleep disturbance and agitation    Physical Exam: Vitals:   02/21/17 1300 02/21/17 1330 02/21/17 1454 02/21/17 1500  BP: (!) 153/70 (!) 144/86 (!) 150/97 (!) 172/92  Pulse: 84 79 81 78  Resp: 19 17 17    SpO2: 96%  95% 98% 98%  Weight:         Constitutional: NAD, calm, comfortable, in pain due to right leg spasms. Eyes: PERRL, lids and conjunctivae normal ENMT: Mucous membranes are moist. Posterior pharynx clear of any exudate or lesions.Normal dentition.  Neck: normal, supple, no masses, no thyromegaly Respiratory: clear to auscultation bilaterally, no wheezing, no crackles. Normal respiratory effort. No accessory muscle use.  Cardiovascular: Regular rate and rhythm, no murmurs / rubs / gallops. No extremity edema. 2+ pedal pulses. No carotid bruits.  Abdomen: no tenderness, no masses palpated. No hepatosplenomegaly. Bowel sounds positive.  Musculoskeletal: no clubbing / cyanosis. No joint deformity upper and lower extremities. Good ROM, no contractures. Normal muscle tone.  Skin: no rashes, lesions, ulcers. No induration Neurologic: Right-sided facial droop, decreased grip strength on the right, unable to lift right arm, unable to wiggle toes on the right. Psychiatric: Normal judgment and insight. Alert and oriented  x 3. Normal mood.    Labs on Admission: I have personally reviewed the following labs and imaging studies  CBC: Recent Labs  Lab 02/21/17 1117 02/21/17 1124  WBC 7.3  --   NEUTROABS 4.7  --   HGB 12.0* 12.2*  HCT 36.3* 36.0*  MCV 92.6  --   PLT 170  --    Basic Metabolic Panel: Recent Labs  Lab 02/21/17 1117 02/21/17 1124  NA 136 139  K 4.5 4.5  CL 104 105  CO2 21*  --   GLUCOSE 139* 135*  BUN 25* 24*  CREATININE 1.40* 1.30*  CALCIUM 9.3  --    GFR: Estimated Creatinine Clearance: 31.9 mL/min (A) (by C-G formula based on SCr of 1.3 mg/dL (H)). Liver Function Tests: Recent Labs  Lab 02/21/17 1117  AST 16  ALT 8*  ALKPHOS 76  BILITOT 1.0  PROT 6.8  ALBUMIN 3.9   No results for input(s): LIPASE, AMYLASE in the last 168 hours. No results for input(s): AMMONIA in the last 168 hours. Coagulation Profile: Recent Labs  Lab 02/21/17 1117  INR 0.95    Cardiac Enzymes: No results for input(s): CKTOTAL, CKMB, CKMBINDEX, TROPONINI in the last 168 hours. BNP (last 3 results) No results for input(s): PROBNP in the last 8760 hours. HbA1C: No results for input(s): HGBA1C in the last 72 hours. CBG: Recent Labs  Lab 02/21/17 1142  GLUCAP 120*   Lipid Profile: No results for input(s): CHOL, HDL, LDLCALC, TRIG, CHOLHDL, LDLDIRECT in the last 72 hours. Thyroid Function Tests: No results for input(s): TSH, T4TOTAL, FREET4, T3FREE, THYROIDAB in the last 72 hours. Anemia Panel: No results for input(s): VITAMINB12, FOLATE, FERRITIN, TIBC, IRON, RETICCTPCT in the last 72 hours. Urine analysis:    Component Value Date/Time   COLORURINE STRAW (A) 02/21/2017 1349   APPEARANCEUR CLEAR 02/21/2017 1349   LABSPEC 1.032 (H) 02/21/2017 1349   PHURINE 6.0 02/21/2017 1349   GLUCOSEU NEGATIVE 02/21/2017 1349   HGBUR NEGATIVE 02/21/2017 1349   BILIRUBINUR NEGATIVE 02/21/2017 1349   KETONESUR NEGATIVE 02/21/2017 1349   PROTEINUR NEGATIVE 02/21/2017 1349   NITRITE NEGATIVE 02/21/2017 1349   LEUKOCYTESUR NEGATIVE 02/21/2017 1349   Sepsis Labs: @LABRCNTIP (procalcitonin:4,lacticidven:4) )No results found for this or any previous visit (from the past 240 hour(s)).   Radiological Exams on Admission: Ct Angio Head W Or Wo Contrast  Result Date: 02/21/2017 CLINICAL DATA:  Code stroke. 84 year old male with right arm and right side body weakness. Unable to walk this morning. EXAM: CT ANGIOGRAPHY HEAD AND NECK CT PERFUSION BRAIN TECHNIQUE: Multidetector CT imaging of the head and neck was performed using the standard protocol during bolus administration of intravenous contrast. Multiplanar CT image reconstructions and MIPs were obtained to evaluate the vascular anatomy. Carotid stenosis measurements (when applicable) are obtained utilizing NASCET criteria, using the distal internal carotid diameter as the denominator. Multiphase CT imaging of the brain was  performed following IV bolus contrast injection. Subsequent parametric perfusion maps were calculated using RAPID software. CONTRAST:  122mL ISOVUE-370 IOPAMIDOL (ISOVUE-370) INJECTION 76% COMPARISON:  Head CT 1117 hours today. FINDINGS: CT Brain Perfusion Findings: CBF (<30%) Volume: None Perfusion (Tmax>6.0s) volume: None Mismatch Volume: Not applicable Infarction Location:Not applicable CTA NECK Skeleton: Prior sternotomy. Degenerative changes throughout the cervical spine. No acute osseous abnormality identified. Upper chest: Negative lung apices. No superior mediastinal lymphadenopathy. Other neck: Negative aside from a partially retropharyngeal course of both carotid arteries. No cervical lymphadenopathy. Aortic arch: Moderate mostly calcified arch atherosclerosis. Three vessel arch  configuration. Right carotid system: No brachiocephalic or right CCA origin stenosis. Intermittent calcified plaque in the right CCA proximal to the bifurcation without stenosis. Right ICA origin and bulb calcified plaque but no associated stenosis. Mildly tortuous cervical right ICA with a partially retropharyngeal course. Left carotid system: No left CCA origin stenosis despite plaque. Tortuous proximal left CC trace calcified plaque proximal to the bifurcation. Bulky calcified plaque at the left ICA origin and bulb with stenosis estimated at 60-70% at the origin and then a radiographic string sign stenosis at the distal bulb level due to calcified plaque (series 5, image 66). Despite this the cervical left ICA remains patent, and has a normal caliber at the skull base. Vertebral arteries: No proximal right subclavian artery stenosis. Calcified plaque at the right vertebral artery origin and V1 segment with moderate associated stenosis. No other right vertebral artery stenosis to the skull base. No proximal left subclavian artery stenosis despite soft and calcified plaque. Calcified plaque adjacent to the left vertebral artery  origin without stenosis (series 9, image 181). Codominant vertebral arteries. No left vertebral artery stenosis to the skull base. CTA HEAD Posterior circulation: Codominant vertebral artery V4 segments without stenosis. Normal PICA origins. Normal vertebrobasilar junction. Patent basilar artery without stenosis. Normal SCA and left PCA origins. There is moderate irregularity and stenosis of both the right PCA origin and the junction with a right posterior posterior communicating artery. The left Posterior communicating artery is diminutive or absent. There is mild irregularity of the left PCA branches. There is mild irregularity in the right PCA P2 segment and inferior right P3 branches. Anterior circulation: Both ICA siphons are patent. There is moderate to extensive bilateral ICA siphon calcified plaque. The right siphon appears mildly dominant. Up to mild bilateral ICA siphon stenosis. Normal ophthalmic and right posterior communicating artery origins. Patent carotid termini with normal MCA and right ACA origins. The left A1 segment is diminutive or absent. The anterior communicating artery and bilateral ACA branches are within normal limits. The left MCA M1 segment is tortuous and mildly irregular but with no significant stenosis. The left MCA bifurcation and left MCA branches appear within normal limits. The right MCA M1 segment is tortuous with mild irregularity but no stenosis. The right MCA trifurcation and right MCA branches are within normal limits. Venous sinuses: Patent. Incidental prominent bilateral internal jugular veins in the neck. Anatomic variants: Dominant right and diminutive or absent left ACA A1 segments. Delayed phase: No abnormal enhancement identified. Stable gray-white matter differentiation throughout the brain. Review of the MIP images confirms the above findings IMPRESSION: 1. Negative for emergent large vessel occlusion, and no core infarct or penumbra detected by CT Perfusion, but  the study is positive for a high-grade RADIOGRAPHIC-STRING-SIGN stenosis of the Left ICA bulb due to bulky calcified plaque. 2. Moderate stenosis of the right vertebral artery origin due to calcified plaque. And Moderate stenosis of the proximal right PCA. 3. Mild bilateral ICA siphon stenosis due to bulky calcified plaque. No significant left carotid or left vertebral artery stenosis. 4. Stable CT appearance of the brain since 1117 hours today. Electronically Signed   By: Genevie Ann M.D.   On: 02/21/2017 13:23   Ct Angio Neck W Or Wo Contrast  Result Date: 02/21/2017 CLINICAL DATA:  Code stroke. 84 year old male with right arm and right side body weakness. Unable to walk this morning. EXAM: CT ANGIOGRAPHY HEAD AND NECK CT PERFUSION BRAIN TECHNIQUE: Multidetector CT imaging of the head and neck was performed  using the standard protocol during bolus administration of intravenous contrast. Multiplanar CT image reconstructions and MIPs were obtained to evaluate the vascular anatomy. Carotid stenosis measurements (when applicable) are obtained utilizing NASCET criteria, using the distal internal carotid diameter as the denominator. Multiphase CT imaging of the brain was performed following IV bolus contrast injection. Subsequent parametric perfusion maps were calculated using RAPID software. CONTRAST:  163mL ISOVUE-370 IOPAMIDOL (ISOVUE-370) INJECTION 76% COMPARISON:  Head CT 1117 hours today. FINDINGS: CT Brain Perfusion Findings: CBF (<30%) Volume: None Perfusion (Tmax>6.0s) volume: None Mismatch Volume: Not applicable Infarction Location:Not applicable CTA NECK Skeleton: Prior sternotomy. Degenerative changes throughout the cervical spine. No acute osseous abnormality identified. Upper chest: Negative lung apices. No superior mediastinal lymphadenopathy. Other neck: Negative aside from a partially retropharyngeal course of both carotid arteries. No cervical lymphadenopathy. Aortic arch: Moderate mostly calcified  arch atherosclerosis. Three vessel arch configuration. Right carotid system: No brachiocephalic or right CCA origin stenosis. Intermittent calcified plaque in the right CCA proximal to the bifurcation without stenosis. Right ICA origin and bulb calcified plaque but no associated stenosis. Mildly tortuous cervical right ICA with a partially retropharyngeal course. Left carotid system: No left CCA origin stenosis despite plaque. Tortuous proximal left CC trace calcified plaque proximal to the bifurcation. Bulky calcified plaque at the left ICA origin and bulb with stenosis estimated at 60-70% at the origin and then a radiographic string sign stenosis at the distal bulb level due to calcified plaque (series 5, image 66). Despite this the cervical left ICA remains patent, and has a normal caliber at the skull base. Vertebral arteries: No proximal right subclavian artery stenosis. Calcified plaque at the right vertebral artery origin and V1 segment with moderate associated stenosis. No other right vertebral artery stenosis to the skull base. No proximal left subclavian artery stenosis despite soft and calcified plaque. Calcified plaque adjacent to the left vertebral artery origin without stenosis (series 9, image 181). Codominant vertebral arteries. No left vertebral artery stenosis to the skull base. CTA HEAD Posterior circulation: Codominant vertebral artery V4 segments without stenosis. Normal PICA origins. Normal vertebrobasilar junction. Patent basilar artery without stenosis. Normal SCA and left PCA origins. There is moderate irregularity and stenosis of both the right PCA origin and the junction with a right posterior posterior communicating artery. The left Posterior communicating artery is diminutive or absent. There is mild irregularity of the left PCA branches. There is mild irregularity in the right PCA P2 segment and inferior right P3 branches. Anterior circulation: Both ICA siphons are patent. There is  moderate to extensive bilateral ICA siphon calcified plaque. The right siphon appears mildly dominant. Up to mild bilateral ICA siphon stenosis. Normal ophthalmic and right posterior communicating artery origins. Patent carotid termini with normal MCA and right ACA origins. The left A1 segment is diminutive or absent. The anterior communicating artery and bilateral ACA branches are within normal limits. The left MCA M1 segment is tortuous and mildly irregular but with no significant stenosis. The left MCA bifurcation and left MCA branches appear within normal limits. The right MCA M1 segment is tortuous with mild irregularity but no stenosis. The right MCA trifurcation and right MCA branches are within normal limits. Venous sinuses: Patent. Incidental prominent bilateral internal jugular veins in the neck. Anatomic variants: Dominant right and diminutive or absent left ACA A1 segments. Delayed phase: No abnormal enhancement identified. Stable gray-white matter differentiation throughout the brain. Review of the MIP images confirms the above findings IMPRESSION: 1. Negative for emergent large vessel  occlusion, and no core infarct or penumbra detected by CT Perfusion, but the study is positive for a high-grade RADIOGRAPHIC-STRING-SIGN stenosis of the Left ICA bulb due to bulky calcified plaque. 2. Moderate stenosis of the right vertebral artery origin due to calcified plaque. And Moderate stenosis of the proximal right PCA. 3. Mild bilateral ICA siphon stenosis due to bulky calcified plaque. No significant left carotid or left vertebral artery stenosis. 4. Stable CT appearance of the brain since 1117 hours today. Electronically Signed   By: Genevie Ann M.D.   On: 02/21/2017 13:23   Mr Brain Wo Contrast  Result Date: 02/21/2017 CLINICAL DATA:  Focal neuro deficit.  Right arm and body weakness. EXAM: MRI HEAD WITHOUT CONTRAST TECHNIQUE: Multiplanar, multiecho pulse sequences of the brain and surrounding structures were  obtained without intravenous contrast. COMPARISON:  Head CT and CTA from earlier today. Brain MRI from 2003 is not available for review. FINDINGS: Brain: 1 cm acute infarct in the left centrum semiovale, roughly in line with the motor strip-correlating with presenting symptoms. Moderate chronic small vessel ischemia in the cerebral white matter. Age concordant cerebral volume loss. No hemorrhage, hydrocephalus, or masslike finding. Vascular: Major flow voids are preserved. Skull and upper cervical spine: Negative for marrow lesion. Sinuses/Orbits: Left mastoid opacification and middle ear opacification with negative nasopharynx. Bilateral cataract resection. IMPRESSION: 1. 1 cm acute infarct in the left posterior centrum semiovale. 2. Moderate chronic small vessel ischemia in the cerebral white matter. Electronically Signed   By: Monte Fantasia M.D.   On: 02/21/2017 14:33   Ct Cerebral Perfusion W Contrast  Result Date: 02/21/2017 CLINICAL DATA:  Code stroke. 84 year old male with right arm and right side body weakness. Unable to walk this morning. EXAM: CT ANGIOGRAPHY HEAD AND NECK CT PERFUSION BRAIN TECHNIQUE: Multidetector CT imaging of the head and neck was performed using the standard protocol during bolus administration of intravenous contrast. Multiplanar CT image reconstructions and MIPs were obtained to evaluate the vascular anatomy. Carotid stenosis measurements (when applicable) are obtained utilizing NASCET criteria, using the distal internal carotid diameter as the denominator. Multiphase CT imaging of the brain was performed following IV bolus contrast injection. Subsequent parametric perfusion maps were calculated using RAPID software. CONTRAST:  145mL ISOVUE-370 IOPAMIDOL (ISOVUE-370) INJECTION 76% COMPARISON:  Head CT 1117 hours today. FINDINGS: CT Brain Perfusion Findings: CBF (<30%) Volume: None Perfusion (Tmax>6.0s) volume: None Mismatch Volume: Not applicable Infarction Location:Not  applicable CTA NECK Skeleton: Prior sternotomy. Degenerative changes throughout the cervical spine. No acute osseous abnormality identified. Upper chest: Negative lung apices. No superior mediastinal lymphadenopathy. Other neck: Negative aside from a partially retropharyngeal course of both carotid arteries. No cervical lymphadenopathy. Aortic arch: Moderate mostly calcified arch atherosclerosis. Three vessel arch configuration. Right carotid system: No brachiocephalic or right CCA origin stenosis. Intermittent calcified plaque in the right CCA proximal to the bifurcation without stenosis. Right ICA origin and bulb calcified plaque but no associated stenosis. Mildly tortuous cervical right ICA with a partially retropharyngeal course. Left carotid system: No left CCA origin stenosis despite plaque. Tortuous proximal left CC trace calcified plaque proximal to the bifurcation. Bulky calcified plaque at the left ICA origin and bulb with stenosis estimated at 60-70% at the origin and then a radiographic string sign stenosis at the distal bulb level due to calcified plaque (series 5, image 66). Despite this the cervical left ICA remains patent, and has a normal caliber at the skull base. Vertebral arteries: No proximal right subclavian artery stenosis. Calcified plaque  at the right vertebral artery origin and V1 segment with moderate associated stenosis. No other right vertebral artery stenosis to the skull base. No proximal left subclavian artery stenosis despite soft and calcified plaque. Calcified plaque adjacent to the left vertebral artery origin without stenosis (series 9, image 181). Codominant vertebral arteries. No left vertebral artery stenosis to the skull base. CTA HEAD Posterior circulation: Codominant vertebral artery V4 segments without stenosis. Normal PICA origins. Normal vertebrobasilar junction. Patent basilar artery without stenosis. Normal SCA and left PCA origins. There is moderate irregularity and  stenosis of both the right PCA origin and the junction with a right posterior posterior communicating artery. The left Posterior communicating artery is diminutive or absent. There is mild irregularity of the left PCA branches. There is mild irregularity in the right PCA P2 segment and inferior right P3 branches. Anterior circulation: Both ICA siphons are patent. There is moderate to extensive bilateral ICA siphon calcified plaque. The right siphon appears mildly dominant. Up to mild bilateral ICA siphon stenosis. Normal ophthalmic and right posterior communicating artery origins. Patent carotid termini with normal MCA and right ACA origins. The left A1 segment is diminutive or absent. The anterior communicating artery and bilateral ACA branches are within normal limits. The left MCA M1 segment is tortuous and mildly irregular but with no significant stenosis. The left MCA bifurcation and left MCA branches appear within normal limits. The right MCA M1 segment is tortuous with mild irregularity but no stenosis. The right MCA trifurcation and right MCA branches are within normal limits. Venous sinuses: Patent. Incidental prominent bilateral internal jugular veins in the neck. Anatomic variants: Dominant right and diminutive or absent left ACA A1 segments. Delayed phase: No abnormal enhancement identified. Stable gray-white matter differentiation throughout the brain. Review of the MIP images confirms the above findings IMPRESSION: 1. Negative for emergent large vessel occlusion, and no core infarct or penumbra detected by CT Perfusion, but the study is positive for a high-grade RADIOGRAPHIC-STRING-SIGN stenosis of the Left ICA bulb due to bulky calcified plaque. 2. Moderate stenosis of the right vertebral artery origin due to calcified plaque. And Moderate stenosis of the proximal right PCA. 3. Mild bilateral ICA siphon stenosis due to bulky calcified plaque. No significant left carotid or left vertebral artery  stenosis. 4. Stable CT appearance of the brain since 1117 hours today. Electronically Signed   By: Genevie Ann M.D.   On: 02/21/2017 13:23   Ct Head Code Stroke Wo Contrast  Result Date: 02/21/2017 CLINICAL DATA:  Code stroke. 84 year old male with right arm and right side body weakness. Unable to walk this morning. EXAM: CT HEAD WITHOUT CONTRAST TECHNIQUE: Contiguous axial images were obtained from the base of the skull through the vertex without intravenous contrast. COMPARISON:  Cervical spine MRI 01/05/2017. FINDINGS: Brain: Patchy bilateral cerebral white matter hypodensity. Cerebral volume is within normal limits for age. No midline shift, ventriculomegaly, mass effect, evidence of mass lesion, intracranial hemorrhage or evidence of cortically based acute infarction. No cortical encephalomalacia identified. Vascular: Calcified atherosclerosis at the skull base. No suspicious intracranial vascular hyperdensity. Skull: Negative. Sinuses/Orbits: Clear. Other: Postoperative changes to both globes. Otherwise negative Visualized orbits and scalp soft tissues. ASPECTS [facility] Stroke Program Early CT Score) - Ganglionic level infarction (caudate, lentiform nuclei, internal capsule, insula, M1-M3 cortex): 7 - Supraganglionic infarction (M4-M6 cortex): 3 Total score (0-10 with 10 being normal): 10 IMPRESSION: 1. No acute cortically based infarct or acute intracranial hemorrhage identified. ASPECTS is 10. 2. Mild to moderate for  age cerebral white matter signal changes, most commonly due to chronic small vessel disease. 3. Study discussed by telephone with Dr. Vonna Kotyk LONG on 02/21/2017 at 11:33 . Electronically Signed   By: Genevie Ann M.D.   On: 02/21/2017 11:33    EKG: Independently reviewed.  Sinus rhythm, no acute ischemic changes, right bundle branch block  Assessment/Plan Principal Problem:   Acute CVA (cerebrovascular accident) ([place]) Active Problems:   Stenosis of left carotid artery   ARF (acute renal  failure) ([facility])   HTN (hypertension)   Hyperlipidemia    Acute CVA -With high-grade left ICA stenosis on CT angiogram and after discussion with vascular surgery, plan to transfer to [facility] for further evaluation. -Echo requested, carotid Dopplers, if still necessary since CT neck was done will need to be ordered upon arrival to [facility]. -Has been started on aspirin, statin. -PT/OT/ST evaluations have been requested.  Acute renal failure -Start some IV fluids and monitor.  May have some degree of chronic kidney disease due to high blood pressure.  I do not have any priors to compare.  Hyperlipidemia -Continue statin  Hypertension -Blood pressure elevated currently with systolics in the 561B, resume home BP meds but okay to allow some permissive hypertension in the face of acute stroke.   DVT prophylaxis: Lovenox Code Status: Full code Family Communication: Wife and daughter at bedside updated on plan of care and questions answered Disposition Plan: Transfer to [place] called: Vascular surgery Admission status: Inpatient   Time Spent: 85 minutes  Estela Isaac Bliss MD [facility] Pager [phone]  If 7PM-7AM, please contact night-coverage [email or web address] Password [facility]  02/21/2017, 4:37 PM

## 2017-02-21 NOTE — ED Provider Notes (Signed)
Emergency Department Provider Note   I have reviewed the triage vital signs and the nursing notes.   HISTORY  Chief Complaint Code Stroke   HPI Victor Bell is a 84 y.o. male with PMH of CAD, GERD, DM, COPD, HTN, and HLD presents to the emergency department for evaluation of sudden onset right sided weakness and numbness.  Initially, EMS states that symptoms began at 8 AM this morning.  Patient states that he had weakness and numbness in the right upper and lower extremities.  He states that the numbness has improved but he continues to feel weak and cannot walk.  He has not noticed any speech changes or difficulty swallowing.  No prior history of stroke.  EMS noted hypertension in route.  Patient also has ongoing evaluation and treatment for right shoulder and back pain.  He has received injections in the shoulder last week and has had prior injections to the spine.  Denies any fevers or chills.  No chest pain or difficulty breathing.   Past Medical History:  Diagnosis Date  . Arthritis   . Asthma   . CAD (coronary artery disease)   . Cancer ([place])    skin   . Carotid artery occlusion   . CHF (congestive heart failure) ([place])   . Colon polyp   . COPD (chronic obstructive pulmonary disease) ([place])   . Diabetes mellitus   . GERD (gastroesophageal reflux disease)   . Heart disease   . Hyperlipidemia   . Hypertension   . Myocardial infarction [facility])    1998    Patient Active Problem List   Diagnosis Date Noted  . Occlusion and stenosis of carotid artery without mention of cerebral infarction 08/03/2011    Past Surgical History:  Procedure Laterality Date  . CATARACT EXTRACTION    . CHOLECYSTECTOMY     Gall bladder  . CORONARY ARTERY BYPASS GRAFT  10/03/96   x7  . TONSILLECTOMY      Current Outpatient Rx  . Order #: [id number] Class: Historical Med  . Order #: [id number] Class: Historical Med  . Order #: [id number] Class: Historical Med  . Order #: [id number] Class: Historical  Med  . Order #: [id number] Class: Historical Med  . Order #: [id number] Class: Historical Med  . Order #: [id number] Class: Historical Med  . Order #: [id number] Class: Historical Med  . Order #: [id number] Class: Historical Med  . Order #: [id number] Class: Historical Med  . Order #: [id number] Class: Historical Med  . Order #: [id number] Class: Historical Med  . Order #: [id number] Class: Historical Med  . Order #: [id number] Class: Historical Med  . Order #: [id number] Class: Historical Med  . Order #: [id number] Class: Historical Med    Allergies Niacin and related  Family History  Problem Relation Age of Onset  . Heart disease Mother   . Aneurysm Sister     Social History Social History   Tobacco Use  . Smoking status: Never Smoker  . Smokeless tobacco: Never Used  Substance Use Topics  . Alcohol use: No    Alcohol/week: 0.0 oz  . Drug use: No    Review of Systems  Constitutional: No fever/chills Eyes: No visual changes. ENT: No sore throat. Cardiovascular: Denies chest pain. Respiratory: Denies shortness of breath. Gastrointestinal: No abdominal pain.  No nausea, no vomiting.  No diarrhea.  No constipation. Genitourinary: Negative for dysuria. Musculoskeletal: Negative for back pain. Positive right shoulder pain.  Skin: Negative for rash. Neurological: Negative for headaches. Positive sudden onset right arm/leg numbness and weakness.   10-point ROS  otherwise negative.  ____________________________________________   PHYSICAL EXAM:  VITAL SIGNS: ED Triage Vitals  Enc Vitals Group     BP 02/21/17 1130 (!) 193/85     Pulse Rate 02/21/17 1130 78     Resp 02/21/17 1130 14     Temp --      Temp src --      SpO2 02/21/17 1115 96 %     Weight 02/21/17 1135 140 lb (63.5 kg)     Pain Score 02/21/17 1135 6    Constitutional: Alert and oriented. Appears to be having intermittent pain in the right shoulder.  Eyes: Conjunctivae are normal.  Head: Atraumatic. Nose: No  congestion/rhinnorhea. Mouth/Throat: Mucous membranes are moist.  Neck: No stridor.  Cardiovascular: Normal rate, regular rhythm. Good peripheral circulation. Grossly normal heart sounds.   Respiratory: Normal respiratory effort.  No retractions. Lungs CTAB. Gastrointestinal: Soft and nontender. No distention.  Musculoskeletal: No lower extremity tenderness nor edema. No gross deformities of extremities. Neurologic:  Normal speech and language. Slight face droop noted. No dysarthria. Otherwise normal CN exam 2-12. 3/5 strength in the upper and lower extremities. Normal sensation bilaterally. No visual field cuts appreciated. Positive pronator drift on the right upper extremity. NIHSS 6.  Skin:  Skin is warm, dry and intact. No rash noted.  ____________________________________________   LABS (all labs ordered are listed, but only abnormal results are displayed)  Labs Reviewed  CBC - Abnormal; Notable for the following components:      Result Value   RBC 3.92 (*)    Hemoglobin 12.0 (*)    HCT 36.3 (*)    All other components within normal limits  COMPREHENSIVE METABOLIC PANEL - Abnormal; Notable for the following components:   CO2 21 (*)    Glucose, Bld 139 (*)    BUN 25 (*)    Creatinine, Ser 1.40 (*)    ALT 8 (*)    GFR calc non Af Amer 42 (*)    GFR calc Af Amer 48 (*)    All other components within normal limits  URINALYSIS, ROUTINE W REFLEX MICROSCOPIC - Abnormal; Notable for the following components:   Color, Urine STRAW (*)    Specific Gravity, Urine 1.032 (*)    All other components within normal limits  I-STAT CHEM 8, ED - Abnormal; Notable for the following components:   BUN 24 (*)    Creatinine, Ser 1.30 (*)    Glucose, Bld 135 (*)    Hemoglobin 12.2 (*)    HCT 36.0 (*)    All other components within normal limits  CBG MONITORING, ED - Abnormal; Notable for the following components:   Glucose-Capillary 120 (*)    All other components within normal limits  ETHANOL   PROTIME-INR  APTT  DIFFERENTIAL  RAPID URINE DRUG SCREEN, HOSP PERFORMED  I-STAT TROPONIN, ED   ____________________________________________  EKG   EKG Interpretation  Date/Time:  Tuesday February 21 2017 11:29:16 EST Ventricular Rate:  78 PR Interval:    QRS Duration: 151 QT Interval:  414 QTC Calculation: 472 R Axis:   28 Text Interpretation:  Sinus rhythm Right bundle branch block No STEMI.  Confirmed by Nanda Quinton [phone]) on 02/21/2017 11:38:59 AM       ____________________________________________  RADIOLOGY  Ct Angio Head W Or Wo Contrast  Result Date: 02/21/2017 CLINICAL DATA:  Code stroke. 84 year old male with right arm and right side body weakness. Unable to walk this morning. EXAM: CT ANGIOGRAPHY HEAD AND NECK CT PERFUSION  BRAIN TECHNIQUE: Multidetector CT imaging of the head and neck was performed using the standard protocol during bolus administration of intravenous contrast. Multiplanar CT image reconstructions and MIPs were obtained to evaluate the vascular anatomy. Carotid stenosis measurements (when applicable) are obtained utilizing NASCET criteria, using the distal internal carotid diameter as the denominator. Multiphase CT imaging of the brain was performed following IV bolus contrast injection. Subsequent parametric perfusion maps were calculated using RAPID software. CONTRAST:  152mL ISOVUE-370 IOPAMIDOL (ISOVUE-370) INJECTION 76% COMPARISON:  Head CT 1117 hours today. FINDINGS: CT Brain Perfusion Findings: CBF (<30%) Volume: None Perfusion (Tmax>6.0s) volume: None Mismatch Volume: Not applicable Infarction Location:Not applicable CTA NECK Skeleton: Prior sternotomy. Degenerative changes throughout the cervical spine. No acute osseous abnormality identified. Upper chest: Negative lung apices. No superior mediastinal lymphadenopathy. Other neck: Negative aside from a partially retropharyngeal course of both carotid arteries. No cervical lymphadenopathy. Aortic  arch: Moderate mostly calcified arch atherosclerosis. Three vessel arch configuration. Right carotid system: No brachiocephalic or right CCA origin stenosis. Intermittent calcified plaque in the right CCA proximal to the bifurcation without stenosis. Right ICA origin and bulb calcified plaque but no associated stenosis. Mildly tortuous cervical right ICA with a partially retropharyngeal course. Left carotid system: No left CCA origin stenosis despite plaque. Tortuous proximal left CC trace calcified plaque proximal to the bifurcation. Bulky calcified plaque at the left ICA origin and bulb with stenosis estimated at 60-70% at the origin and then a radiographic string sign stenosis at the distal bulb level due to calcified plaque (series 5, image 66). Despite this the cervical left ICA remains patent, and has a normal caliber at the skull base. Vertebral arteries: No proximal right subclavian artery stenosis. Calcified plaque at the right vertebral artery origin and V1 segment with moderate associated stenosis. No other right vertebral artery stenosis to the skull base. No proximal left subclavian artery stenosis despite soft and calcified plaque. Calcified plaque adjacent to the left vertebral artery origin without stenosis (series 9, image 181). Codominant vertebral arteries. No left vertebral artery stenosis to the skull base. CTA HEAD Posterior circulation: Codominant vertebral artery V4 segments without stenosis. Normal PICA origins. Normal vertebrobasilar junction. Patent basilar artery without stenosis. Normal SCA and left PCA origins. There is moderate irregularity and stenosis of both the right PCA origin and the junction with a right posterior posterior communicating artery. The left Posterior communicating artery is diminutive or absent. There is mild irregularity of the left PCA branches. There is mild irregularity in the right PCA P2 segment and inferior right P3 branches. Anterior circulation: Both ICA  siphons are patent. There is moderate to extensive bilateral ICA siphon calcified plaque. The right siphon appears mildly dominant. Up to mild bilateral ICA siphon stenosis. Normal ophthalmic and right posterior communicating artery origins. Patent carotid termini with normal MCA and right ACA origins. The left A1 segment is diminutive or absent. The anterior communicating artery and bilateral ACA branches are within normal limits. The left MCA M1 segment is tortuous and mildly irregular but with no significant stenosis. The left MCA bifurcation and left MCA branches appear within normal limits. The right MCA M1 segment is tortuous with mild irregularity but no stenosis. The right MCA trifurcation and right MCA branches are within normal limits. Venous sinuses: Patent. Incidental prominent bilateral internal jugular veins in the neck. Anatomic variants: Dominant right and diminutive or absent left ACA A1 segments. Delayed phase: No abnormal enhancement identified. Stable gray-white matter differentiation throughout the brain. Review of the MIP  images confirms the above findings IMPRESSION: 1. Negative for emergent large vessel occlusion, and no core infarct or penumbra detected by CT Perfusion, but the study is positive for a high-grade RADIOGRAPHIC-STRING-SIGN stenosis of the Left ICA bulb due to bulky calcified plaque. 2. Moderate stenosis of the right vertebral artery origin due to calcified plaque. And Moderate stenosis of the proximal right PCA. 3. Mild bilateral ICA siphon stenosis due to bulky calcified plaque. No significant left carotid or left vertebral artery stenosis. 4. Stable CT appearance of the brain since 1117 hours today. Electronically Signed   By: Genevie Ann M.D.   On: 02/21/2017 13:23   Ct Angio Neck W Or Wo Contrast  Result Date: 02/21/2017 CLINICAL DATA:  Code stroke. 84 year old male with right arm and right side body weakness. Unable to walk this morning. EXAM: CT ANGIOGRAPHY HEAD AND NECK  CT PERFUSION BRAIN TECHNIQUE: Multidetector CT imaging of the head and neck was performed using the standard protocol during bolus administration of intravenous contrast. Multiplanar CT image reconstructions and MIPs were obtained to evaluate the vascular anatomy. Carotid stenosis measurements (when applicable) are obtained utilizing NASCET criteria, using the distal internal carotid diameter as the denominator. Multiphase CT imaging of the brain was performed following IV bolus contrast injection. Subsequent parametric perfusion maps were calculated using RAPID software. CONTRAST:  111mL ISOVUE-370 IOPAMIDOL (ISOVUE-370) INJECTION 76% COMPARISON:  Head CT 1117 hours today. FINDINGS: CT Brain Perfusion Findings: CBF (<30%) Volume: None Perfusion (Tmax>6.0s) volume: None Mismatch Volume: Not applicable Infarction Location:Not applicable CTA NECK Skeleton: Prior sternotomy. Degenerative changes throughout the cervical spine. No acute osseous abnormality identified. Upper chest: Negative lung apices. No superior mediastinal lymphadenopathy. Other neck: Negative aside from a partially retropharyngeal course of both carotid arteries. No cervical lymphadenopathy. Aortic arch: Moderate mostly calcified arch atherosclerosis. Three vessel arch configuration. Right carotid system: No brachiocephalic or right CCA origin stenosis. Intermittent calcified plaque in the right CCA proximal to the bifurcation without stenosis. Right ICA origin and bulb calcified plaque but no associated stenosis. Mildly tortuous cervical right ICA with a partially retropharyngeal course. Left carotid system: No left CCA origin stenosis despite plaque. Tortuous proximal left CC trace calcified plaque proximal to the bifurcation. Bulky calcified plaque at the left ICA origin and bulb with stenosis estimated at 60-70% at the origin and then a radiographic string sign stenosis at the distal bulb level due to calcified plaque (series 5, image 66).  Despite this the cervical left ICA remains patent, and has a normal caliber at the skull base. Vertebral arteries: No proximal right subclavian artery stenosis. Calcified plaque at the right vertebral artery origin and V1 segment with moderate associated stenosis. No other right vertebral artery stenosis to the skull base. No proximal left subclavian artery stenosis despite soft and calcified plaque. Calcified plaque adjacent to the left vertebral artery origin without stenosis (series 9, image 181). Codominant vertebral arteries. No left vertebral artery stenosis to the skull base. CTA HEAD Posterior circulation: Codominant vertebral artery V4 segments without stenosis. Normal PICA origins. Normal vertebrobasilar junction. Patent basilar artery without stenosis. Normal SCA and left PCA origins. There is moderate irregularity and stenosis of both the right PCA origin and the junction with a right posterior posterior communicating artery. The left Posterior communicating artery is diminutive or absent. There is mild irregularity of the left PCA branches. There is mild irregularity in the right PCA P2 segment and inferior right P3 branches. Anterior circulation: Both ICA siphons are patent. There is moderate to  extensive bilateral ICA siphon calcified plaque. The right siphon appears mildly dominant. Up to mild bilateral ICA siphon stenosis. Normal ophthalmic and right posterior communicating artery origins. Patent carotid termini with normal MCA and right ACA origins. The left A1 segment is diminutive or absent. The anterior communicating artery and bilateral ACA branches are within normal limits. The left MCA M1 segment is tortuous and mildly irregular but with no significant stenosis. The left MCA bifurcation and left MCA branches appear within normal limits. The right MCA M1 segment is tortuous with mild irregularity but no stenosis. The right MCA trifurcation and right MCA branches are within normal limits.  Venous sinuses: Patent. Incidental prominent bilateral internal jugular veins in the neck. Anatomic variants: Dominant right and diminutive or absent left ACA A1 segments. Delayed phase: No abnormal enhancement identified. Stable gray-white matter differentiation throughout the brain. Review of the MIP images confirms the above findings IMPRESSION: 1. Negative for emergent large vessel occlusion, and no core infarct or penumbra detected by CT Perfusion, but the study is positive for a high-grade RADIOGRAPHIC-STRING-SIGN stenosis of the Left ICA bulb due to bulky calcified plaque. 2. Moderate stenosis of the right vertebral artery origin due to calcified plaque. And Moderate stenosis of the proximal right PCA. 3. Mild bilateral ICA siphon stenosis due to bulky calcified plaque. No significant left carotid or left vertebral artery stenosis. 4. Stable CT appearance of the brain since 1117 hours today. Electronically Signed   By: Genevie Ann M.D.   On: 02/21/2017 13:23   Mr Brain Wo Contrast  Result Date: 02/21/2017 CLINICAL DATA:  Focal neuro deficit.  Right arm and body weakness. EXAM: MRI HEAD WITHOUT CONTRAST TECHNIQUE: Multiplanar, multiecho pulse sequences of the brain and surrounding structures were obtained without intravenous contrast. COMPARISON:  Head CT and CTA from earlier today. Brain MRI from 2003 is not available for review. FINDINGS: Brain: 1 cm acute infarct in the left centrum semiovale, roughly in line with the motor strip-correlating with presenting symptoms. Moderate chronic small vessel ischemia in the cerebral white matter. Age concordant cerebral volume loss. No hemorrhage, hydrocephalus, or masslike finding. Vascular: Major flow voids are preserved. Skull and upper cervical spine: Negative for marrow lesion. Sinuses/Orbits: Left mastoid opacification and middle ear opacification with negative nasopharynx. Bilateral cataract resection. IMPRESSION: 1. 1 cm acute infarct in the left posterior  centrum semiovale. 2. Moderate chronic small vessel ischemia in the cerebral white matter. Electronically Signed   By: Monte Fantasia M.D.   On: 02/21/2017 14:33   Ct Cerebral Perfusion W Contrast  Result Date: 02/21/2017 CLINICAL DATA:  Code stroke. 84 year old male with right arm and right side body weakness. Unable to walk this morning. EXAM: CT ANGIOGRAPHY HEAD AND NECK CT PERFUSION BRAIN TECHNIQUE: Multidetector CT imaging of the head and neck was performed using the standard protocol during bolus administration of intravenous contrast. Multiplanar CT image reconstructions and MIPs were obtained to evaluate the vascular anatomy. Carotid stenosis measurements (when applicable) are obtained utilizing NASCET criteria, using the distal internal carotid diameter as the denominator. Multiphase CT imaging of the brain was performed following IV bolus contrast injection. Subsequent parametric perfusion maps were calculated using RAPID software. CONTRAST:  121mL ISOVUE-370 IOPAMIDOL (ISOVUE-370) INJECTION 76% COMPARISON:  Head CT 1117 hours today. FINDINGS: CT Brain Perfusion Findings: CBF (<30%) Volume: None Perfusion (Tmax>6.0s) volume: None Mismatch Volume: Not applicable Infarction Location:Not applicable CTA NECK Skeleton: Prior sternotomy. Degenerative changes throughout the cervical spine. No acute osseous abnormality identified. Upper chest: Negative lung  apices. No superior mediastinal lymphadenopathy. Other neck: Negative aside from a partially retropharyngeal course of both carotid arteries. No cervical lymphadenopathy. Aortic arch: Moderate mostly calcified arch atherosclerosis. Three vessel arch configuration. Right carotid system: No brachiocephalic or right CCA origin stenosis. Intermittent calcified plaque in the right CCA proximal to the bifurcation without stenosis. Right ICA origin and bulb calcified plaque but no associated stenosis. Mildly tortuous cervical right ICA with a partially  retropharyngeal course. Left carotid system: No left CCA origin stenosis despite plaque. Tortuous proximal left CC trace calcified plaque proximal to the bifurcation. Bulky calcified plaque at the left ICA origin and bulb with stenosis estimated at 60-70% at the origin and then a radiographic string sign stenosis at the distal bulb level due to calcified plaque (series 5, image 66). Despite this the cervical left ICA remains patent, and has a normal caliber at the skull base. Vertebral arteries: No proximal right subclavian artery stenosis. Calcified plaque at the right vertebral artery origin and V1 segment with moderate associated stenosis. No other right vertebral artery stenosis to the skull base. No proximal left subclavian artery stenosis despite soft and calcified plaque. Calcified plaque adjacent to the left vertebral artery origin without stenosis (series 9, image 181). Codominant vertebral arteries. No left vertebral artery stenosis to the skull base. CTA HEAD Posterior circulation: Codominant vertebral artery V4 segments without stenosis. Normal PICA origins. Normal vertebrobasilar junction. Patent basilar artery without stenosis. Normal SCA and left PCA origins. There is moderate irregularity and stenosis of both the right PCA origin and the junction with a right posterior posterior communicating artery. The left Posterior communicating artery is diminutive or absent. There is mild irregularity of the left PCA branches. There is mild irregularity in the right PCA P2 segment and inferior right P3 branches. Anterior circulation: Both ICA siphons are patent. There is moderate to extensive bilateral ICA siphon calcified plaque. The right siphon appears mildly dominant. Up to mild bilateral ICA siphon stenosis. Normal ophthalmic and right posterior communicating artery origins. Patent carotid termini with normal MCA and right ACA origins. The left A1 segment is diminutive or absent. The anterior communicating  artery and bilateral ACA branches are within normal limits. The left MCA M1 segment is tortuous and mildly irregular but with no significant stenosis. The left MCA bifurcation and left MCA branches appear within normal limits. The right MCA M1 segment is tortuous with mild irregularity but no stenosis. The right MCA trifurcation and right MCA branches are within normal limits. Venous sinuses: Patent. Incidental prominent bilateral internal jugular veins in the neck. Anatomic variants: Dominant right and diminutive or absent left ACA A1 segments. Delayed phase: No abnormal enhancement identified. Stable gray-white matter differentiation throughout the brain. Review of the MIP images confirms the above findings IMPRESSION: 1. Negative for emergent large vessel occlusion, and no core infarct or penumbra detected by CT Perfusion, but the study is positive for a high-grade RADIOGRAPHIC-STRING-SIGN stenosis of the Left ICA bulb due to bulky calcified plaque. 2. Moderate stenosis of the right vertebral artery origin due to calcified plaque. And Moderate stenosis of the proximal right PCA. 3. Mild bilateral ICA siphon stenosis due to bulky calcified plaque. No significant left carotid or left vertebral artery stenosis. 4. Stable CT appearance of the brain since 1117 hours today. Electronically Signed   By: Genevie Ann M.D.   On: 02/21/2017 13:23   Ct Head Code Stroke Wo Contrast  Result Date: 02/21/2017 CLINICAL DATA:  Code stroke. 84 year old male with right arm  and right side body weakness. Unable to walk this morning. EXAM: CT HEAD WITHOUT CONTRAST TECHNIQUE: Contiguous axial images were obtained from the base of the skull through the vertex without intravenous contrast. COMPARISON:  Cervical spine MRI 01/05/2017. FINDINGS: Brain: Patchy bilateral cerebral white matter hypodensity. Cerebral volume is within normal limits for age. No midline shift, ventriculomegaly, mass effect, evidence of mass lesion, intracranial  hemorrhage or evidence of cortically based acute infarction. No cortical encephalomalacia identified. Vascular: Calcified atherosclerosis at the skull base. No suspicious intracranial vascular hyperdensity. Skull: Negative. Sinuses/Orbits: Clear. Other: Postoperative changes to both globes. Otherwise negative Visualized orbits and scalp soft tissues. ASPECTS [facility] Stroke Program Early CT Score) - Ganglionic level infarction (caudate, lentiform nuclei, internal capsule, insula, M1-M3 cortex): 7 - Supraganglionic infarction (M4-M6 cortex): 3 Total score (0-10 with 10 being normal): 10 IMPRESSION: 1. No acute cortically based infarct or acute intracranial hemorrhage identified. ASPECTS is 10. 2. Mild to moderate for age cerebral white matter signal changes, most commonly due to chronic small vessel disease. 3. Study discussed by telephone with Dr. Vonna Kotyk Heli Dino on 02/21/2017 at 11:33 . Electronically Signed   By: Genevie Ann M.D.   On: 02/21/2017 11:33    ____________________________________________   PROCEDURES  Procedure(s) performed:   .Critical Care Performed by: Margette Fast, MD Authorized by: Margette Fast, MD   Critical care provider statement:    Critical care time (minutes):  45   Critical care time was exclusive of:  Separately billable procedures and treating other patients and teaching time   Critical care was necessary to treat or prevent imminent or life-threatening deterioration of the following conditions:  CNS failure or compromise   Critical care was time spent personally by me on the following activities:  Blood draw for specimens, development of treatment plan with patient or surrogate, discussions with consultants, evaluation of patient's response to treatment, examination of patient, ordering and performing treatments and interventions, ordering and review of laboratory studies, ordering and review of radiographic studies, pulse oximetry, re-evaluation of patient's condition and  review of old charts   I assumed direction of critical care for this patient from another provider in my specialty: no     ____________________________________________   INITIAL IMPRESSION / [place] / ED COURSE  Pertinent labs & imaging results that were available during my care of the patient were reviewed by me and considered in my medical decision making (see chart for details).  Patient presents to the emergency department for evaluation of strokelike symptoms by EMS.  I activated a code stroke on arrival after my evaluation.  The patient has right arm and leg weakness that is significant.  No sensory deficits.  Also has a mild right face droop.  Do not feel this is consistent with spinal cord etiology.  On further evaluation it seems that the patient's last seen normal was 8:30 PM last night.  He apparently woke up at 8 AM with the symptoms.  No TPA.  Ordered CT angios with perfusion per neurology recommendation to rule out LVO.   Spoke with [facility]. Last seen normal appears to have actually been last night. No tPA but will send for CTA head/neck with perfusion. Suspect CVA. Will need f/u MRI. Symptoms not improving spontaneously in the ED. Updated patient and family in detail.   Spoke with [facility] regarding the CT angio results. Recommends ASA and Plavix with Vascular Surgery consultation. Will call Vascular and discuss admission at [facility] with Hospitalist.  02:10 PM Spoke with Dr. Scot Dock with Vascular Surgery. Recommends ASA and [place]. He will see the patient in consultation when he arrives at [facility]. Please have the primary team page when he arrives.   Discussed patient's case with Hospitalist, Dr. Jerilee Hoh to request admission. Patient and family (if present) updated with plan. Care transferred to Hospitalist service.  I reviewed all nursing notes, vitals, pertinent old records, EKGs, labs, imaging (as  available).  ____________________________________________  FINAL CLINICAL IMPRESSION(S) / ED DIAGNOSES  Final diagnoses:  Acute ischemic stroke ([place])     MEDICATIONS GIVEN DURING THIS VISIT:  Medications  iopamidol (ISOVUE-370) 76 % injection 100 mL (100 mLs Intravenous Contrast Given 02/21/17 1250)    Note:  This document was prepared using Dragon voice recognition software and may include unintentional dictation errors.  Nanda Quinton, MD Emergency Medicine \   Louanne Calvillo, [facility], MD 02/21/17 [phone]

## 2017-02-22 ENCOUNTER — Inpatient Hospital Stay (HOSPITAL_COMMUNITY): Payer: Medicare Other

## 2017-02-22 ENCOUNTER — Encounter: Payer: Self-pay | Admitting: Neurology

## 2017-02-22 DIAGNOSIS — I10 Essential (primary) hypertension: Secondary | ICD-10-CM

## 2017-02-22 DIAGNOSIS — I639 Cerebral infarction, unspecified: Secondary | ICD-10-CM

## 2017-02-22 LAB — ECHOCARDIOGRAM COMPLETE
Height: 67 in
Weight: 2310.42 oz

## 2017-02-22 LAB — GLUCOSE, CAPILLARY
Glucose-Capillary: 132 mg/dL — ABNORMAL HIGH (ref 65–99)
Glucose-Capillary: 117 mg/dL — ABNORMAL HIGH (ref 65–99)
Glucose-Capillary: 121 mg/dL — ABNORMAL HIGH (ref 65–99)
Glucose-Capillary: 153 mg/dL — ABNORMAL HIGH (ref 65–99)

## 2017-02-22 LAB — LIPID PANEL
Cholesterol: 139 mg/dL (ref 0–200)
HDL: 36 mg/dL — ABNORMAL LOW (ref >40)
LDL Cholesterol: 71 mg/dL (ref 0–99)
Total CHOL/HDL Ratio: 3.9 RATIO
Triglycerides: 162 mg/dL — ABNORMAL HIGH (ref <150)
VLDL: 32 mg/dL (ref 0–40)

## 2017-02-22 LAB — HEMOGLOBIN A1C
Hgb A1c MFr Bld: 6.5 % — ABNORMAL HIGH (ref 4.8–5.6)
Mean Plasma Glucose: 139.85 mg/dL

## 2017-02-22 MED ORDER — URELLE 81 MG PO TABS
1.0000 | ORAL_TABLET | Freq: Once | ORAL | Status: AC
  Administered 2017-02-22: 81 mg via ORAL
  Filled 2017-02-22: qty 1

## 2017-02-22 MED ORDER — INSULIN ASPART 100 UNIT/ML ~~LOC~~ SOLN
0.0000 [IU] | Freq: Three times a day (TID) | SUBCUTANEOUS | Status: DC
  Administered 2017-02-22: 1 [IU] via SUBCUTANEOUS
  Administered 2017-02-23: 2 [IU] via SUBCUTANEOUS
  Administered 2017-02-23: 1 [IU] via SUBCUTANEOUS

## 2017-02-22 NOTE — Progress Notes (Signed)
Pt 84 yr old white male  transferred from [facility] via care. Link. With rt sided weakness upon awaking this am . Pt alert and oriented x 4 . Oriented to room and use of call light.c/o rt shoulder pain rated 6/10 but denied any CP nor  SOB  Cardiac monitor placed and plan of care reviewed with pt and family at bedside Initial assessment done

## 2017-02-22 NOTE — Progress Notes (Signed)
Inpatient Rehabilitation  Per PT request, patient was screened by Gunnar Fusi for appropriateness for an Inpatient Acute Rehab consult.  At this time we are recommending an Inpatient Rehab consult.  Text paged MD to notify; please order if you are agreeable.    Carmelia Roller., CCC/SLP Admission Coordinator  [place]  Cell [phone]

## 2017-02-22 NOTE — Evaluation (Signed)
Speech Language Pathology Evaluation Patient Details Name: Doral Ventrella MRN: [id number] DOB: 1922-04-07 Today's Date: 02/22/2017 Time: 4098-1191 SLP Time Calculation (min) (ACUTE ONLY): 15 min  Problem List:  Patient Active Problem List   Diagnosis Date Noted  . Acute CVA (cerebrovascular accident) ([place]) 02/21/2017  . Stenosis of left carotid artery 02/21/2017  . ARF (acute renal failure) ([place]) 02/21/2017  . HTN (hypertension) 02/21/2017  . Hyperlipidemia 02/21/2017  . Occlusion and stenosis of carotid artery without mention of cerebral infarction 08/03/2011   Past Medical History:  Past Medical History:  Diagnosis Date  . Arthritis   . Asthma   . CAD (coronary artery disease)   . Cancer ([place])    skin   . Carotid artery occlusion   . CHF (congestive heart failure) ([place])   . Colon polyp   . COPD (chronic obstructive pulmonary disease) ([place])   . Diabetes mellitus   . GERD (gastroesophageal reflux disease)   . Heart disease   . Hyperlipidemia   . Hypertension   . Myocardial infarction ([place])    1998   Past Surgical History:  Past Surgical History:  Procedure Laterality Date  . CATARACT EXTRACTION    . CHOLECYSTECTOMY     Gall bladder  . CORONARY ARTERY BYPASS GRAFT  10/03/96   x7  . TONSILLECTOMY     HPI:  84 y.o. male admitted with acute CVA.  MRI 1 cm acute infarct in the left posterior centrum semi-ovale; stenosis left carotid artery, ARF, HTN.    Assessment / Plan / Recommendation Clinical Impression  Pt presents with functional cognition consistent with baseline, normal expression of thoughts, good comprehension, fluent speech without dysarthria.  No acute deficits identified.  Discussed with dtr, pt, who agree. No SLP f/u warranted - our services will sign off.     SLP Assessment  SLP Recommendation/Assessment: Patient does not need any further Speech Lanaguage Pathology Services    Follow Up Recommendations  None    Frequency and Duration            SLP Evaluation Cognition  Overall Cognitive Status: Within Functional Limits for tasks assessed Arousal/Alertness: Awake/alert Orientation Level: Oriented X4 Attention: Selective Selective Attention: Appears intact Memory: Appears intact Safety/Judgment: Appears intact       Comprehension  Auditory Comprehension Overall Auditory Comprehension: Appears within functional limits for tasks assessed Yes/No Questions: Within Functional Limits Commands: Within Functional Limits Conversation: Simple Visual Recognition/Discrimination Discrimination: Within Function Limits Reading Comprehension Reading Status: Not tested    Expression Expression Primary Mode of Expression: Verbal Verbal Expression Overall Verbal Expression: Appears within functional limits for tasks assessed Initiation: No impairment Automatic Speech: Social Response Level of Generative/Spontaneous Verbalization: Conversation Repetition: No impairment Naming: No impairment Pragmatics: No impairment   Oral / Motor  Oral Motor/Sensory Function Overall Oral Motor/Sensory Function: Mild impairment Facial Symmetry: Abnormal symmetry right;Suspected CN VII (facial) dysfunction Motor Speech Overall Motor Speech: Appears within functional limits for tasks assessed   GO                    Juan Quam Laurice 02/22/2017, 10:50 AM

## 2017-02-22 NOTE — Plan of Care (Signed)
  Education: Knowledge of patient specific risk factors addressed and post discharge goals established will improve 02/22/2017 0657 - Progressing by Marcos Eke, RN   Education: Knowledge of General Education information will improve 02/22/2017 [phone] - Progressing by Marcos Eke, RN   Self-Care: Verbalization of feelings and concerns over difficulty with self-care will improve 02/22/2017 0657 - Progressing by Carin Hock I, RN   Nutrition: Risk of aspiration will decrease 02/22/2017 0657 - Progressing by Marcos Eke, RN

## 2017-02-22 NOTE — Progress Notes (Signed)
   VASCULAR SURGERY ASSESSMENT & PLAN:   No changes overnight.  He has some persistent right-sided weakness.  Given that he is 46 with multiple medical comorbidities including coronary artery disease, congestive heart failure, and COPD, I think he would be high risk for surgery.  I will ask Dr. to review his CT angiogram of the neck.[place] to see if he is a potential candidate for carotid stenting.  If he is not a candidate for carotid stenting the options would be to proceed with left carotid endarterectomy despite the increased risk versus maximum medical therapy.  He is on aspirin and a statin and I added Plavix last night.  SUBJECTIVE:   Still complains of right-sided weakness and paresthesias in the right foot.  PHYSICAL EXAM:   Vitals:   02/21/17 2325 02/22/17 0026 02/22/17 0332 02/22/17 0533  BP: 136/62 (!) 171/84 (!) 141/71 139/73  Pulse: 68 74 61 66  Resp: 16 18 18 18   Temp: (!) 97.3 F (36.3 C) 97.6 F (36.4 C) 97.8 F (36.6 C) 97.8 F (36.6 C)  TempSrc: Oral Oral Oral Oral  SpO2: 99% 99% 97% 98%  Weight:      Height:       3 out of 5 grip on the right.  Mild right lower extremity weakness.   LABS:   Lab Results  Component Value Date   WBC 8.1 02/21/2017   HGB 11.0 (L) 02/21/2017   HCT 33.1 (L) 02/21/2017   MCV 90.4 02/21/2017   PLT 146 (L) 02/21/2017   Lab Results  Component Value Date   CREATININE 1.30 (H) 02/21/2017   Lab Results  Component Value Date   INR 0.95 02/21/2017   CBG (last 3)  Recent Labs    02/21/17 2044 02/21/17 2147 02/22/17 0610  GLUCAP 101* 149* 132*    PROBLEM LIST:    Principal Problem:   Acute CVA (cerebrovascular accident) ([place]) Active Problems:   Stenosis of left carotid artery   ARF (acute renal failure) ([facility])   HTN (hypertension)   Hyperlipidemia   CURRENT MEDS:   . aspirin  300 mg Rectal Daily   Or  . aspirin  325 mg Oral Daily  . atorvastatin  80 mg Oral q1800  . benazepril  5 mg Oral Daily  .  carbidopa-levodopa  1 tablet Oral TID WC & HS  . Carbidopa-Levodopa ER  1 tablet Oral TID WC & HS  . clopidogrel  75 mg Oral Daily  . dutasteride  0.5 mg Oral Daily  . enoxaparin (LOVENOX) injection  40 mg Subcutaneous Q24H  . ezetimibe  10 mg Oral Daily  . finasteride  5 mg Oral Daily  . gabapentin  100 mg Oral QHS  . ipratropium  1 spray Nasal Daily  . loratadine  10 mg Oral Daily  . metFORMIN  500 mg Oral TID WC  . metoprolol succinate  25 mg Oral Daily  . nabumetone  500 mg Oral BID  . pantoprazole  40 mg Oral Daily  . polyethylene glycol  1 packet Oral Daily  . tamsulosin  0.4 mg Oral Daily    Deitra Mayo Beeper: [phone] Office: [phone] 02/22/2017

## 2017-02-22 NOTE — Progress Notes (Signed)
PROGRESS NOTE    Victor Bell  SHF:[id number] DOB: 02-26-1922 DOA: 02/21/2017 PCP: Glenda Chroman, MD    Brief Narrative:   62 with multiple medical comorbidities including coronary artery disease, congestive heart failure, and COPD who presented with right sided weakness.  Assessment & Plan:   Principal Problem:   Acute CVA (cerebrovascular accident) ([place]) - MRI reporting:  1. 1 cm acute infarct in the left posterior centrum semiovale. - Currently suspected to be secondary to carotid artery narrowing. Vascular surgery on board and currently evaluating patient to see if patient is candidate for endarterectomy.  Active Problems:   Stenosis of left carotid artery - please see discussion above - Vascular surgery on board - pt on aspirin, plavix, statin.    ARF (acute renal failure) ([facility])    HTN (hypertension) - Will hold BP medications given recent stroke confirmed on  imaging studies    Hyperlipidemia - continue statin  DM - discontinued metformin given recent CT scans with contrast - placed on SSI  DVT prophylaxis: Lovenox Code Status: Full Family Communication: d/c with patient and family at bedside. Disposition Plan: pending plan by vascular surgery   Consultants:   Vascular surgery  Procedures: none but being evaluated for left carotid endarterectomy    Antimicrobials: none   Subjective: Pt has no new complaints  Objective: Vitals:   02/21/17 2325 02/22/17 0026 02/22/17 0332 02/22/17 0533  BP: 136/62 (!) 171/84 (!) 141/71 139/73  Pulse: 68 74 61 66  Resp: 16 18 18 18   Temp: (!) 97.3 F (36.3 C) 97.6 F (36.4 C) 97.8 F (36.6 C) 97.8 F (36.6 C)  TempSrc: Oral Oral Oral Oral  SpO2: 99% 99% 97% 98%  Weight:      Height:        Intake/Output Summary (Last 24 hours) at 02/22/2017 1448 Last data filed at 02/22/2017 0600 Gross per 24 hour  Intake 1698.75 ml  Output 1150 ml  Net 548.75 ml   Filed Weights   02/21/17 1135 02/21/17 1925  Weight:  63.5 kg (140 lb) 65.5 kg (144 lb 6.4 oz)    Examination:  General exam: Appears calm and comfortable, in nad.  Respiratory system: Clear to auscultation. Respiratory effort normal. Cardiovascular system: S1 & S2 heard, RRR. + carotid murmur Gastrointestinal system: Abdomen is nondistended, soft and nontender. No organomegaly or masses felt. Normal bowel sounds heard. Central nervous system: Alert and oriented. Answers questions appropriately Extremities: warm, + pulses Skin: No rashes, lesions or ulcers, on limited exam Psychiatry: Mood & affect appropriate.     Data Reviewed: I have personally reviewed following labs and imaging studies  CBC: Recent Labs  Lab 02/21/17 1117 02/21/17 1124 02/21/17 2043  WBC 7.3  --  8.1  NEUTROABS 4.7  --   --   HGB 12.0* 12.2* 11.0*  HCT 36.3* 36.0* 33.1*  MCV 92.6  --  90.4  PLT 170  --  502*   Basic Metabolic Panel: Recent Labs  Lab 02/21/17 1117 02/21/17 1124 02/21/17 2043  NA 136 139  --   K 4.5 4.5  --   CL 104 105  --   CO2 21*  --   --   GLUCOSE 139* 135*  --   BUN 25* 24*  --   CREATININE 1.40* 1.30* 1.30*  CALCIUM 9.3  --   --    GFR: Estimated Creatinine Clearance: 32.9 mL/min (A) (by C-G formula based on SCr of 1.3 mg/dL (H)). Liver Function Tests: Recent Labs  Lab 02/21/17 1117  AST 16  ALT 8*  ALKPHOS 76  BILITOT 1.0  PROT 6.8  ALBUMIN 3.9   No results for input(s): LIPASE, AMYLASE in the last 168 hours. No results for input(s): AMMONIA in the last 168 hours. Coagulation Profile: Recent Labs  Lab 02/21/17 1117  INR 0.95   Cardiac Enzymes: No results for input(s): CKTOTAL, CKMB, CKMBINDEX, TROPONINI in the last 168 hours. BNP (last 3 results) No results for input(s): PROBNP in the last 8760 hours. HbA1C: Recent Labs    02/22/17 0335  HGBA1C 6.5*   CBG: Recent Labs  Lab 02/21/17 1757 02/21/17 2044 02/21/17 2147 02/22/17 0610 02/22/17 1149  GLUCAP 124* 101* 149* 132* 117*   Lipid  Profile: Recent Labs    02/22/17 0335  CHOL 139  HDL 36*  LDLCALC 71  TRIG 162*  CHOLHDL 3.9   Thyroid Function Tests: No results for input(s): TSH, T4TOTAL, FREET4, T3FREE, THYROIDAB in the last 72 hours. Anemia Panel: No results for input(s): VITAMINB12, FOLATE, FERRITIN, TIBC, IRON, RETICCTPCT in the last 72 hours. Sepsis Labs: No results for input(s): PROCALCITON, LATICACIDVEN in the last 168 hours.  No results found for this or any previous visit (from the past 240 hour(s)).       Radiology Studies: Ct Angio Head W Or Wo Contrast  Result Date: 02/21/2017 CLINICAL DATA:  Code stroke. 84 year old male with right arm and right side body weakness. Unable to walk this morning. EXAM: CT ANGIOGRAPHY HEAD AND NECK CT PERFUSION BRAIN TECHNIQUE: Multidetector CT imaging of the head and neck was performed using the standard protocol during bolus administration of intravenous contrast. Multiplanar CT image reconstructions and MIPs were obtained to evaluate the vascular anatomy. Carotid stenosis measurements (when applicable) are obtained utilizing NASCET criteria, using the distal internal carotid diameter as the denominator. Multiphase CT imaging of the brain was performed following IV bolus contrast injection. Subsequent parametric perfusion maps were calculated using RAPID software. CONTRAST:  156mL ISOVUE-370 IOPAMIDOL (ISOVUE-370) INJECTION 76% COMPARISON:  Head CT 1117 hours today. FINDINGS: CT Brain Perfusion Findings: CBF (<30%) Volume: None Perfusion (Tmax>6.0s) volume: None Mismatch Volume: Not applicable Infarction Location:Not applicable CTA NECK Skeleton: Prior sternotomy. Degenerative changes throughout the cervical spine. No acute osseous abnormality identified. Upper chest: Negative lung apices. No superior mediastinal lymphadenopathy. Other neck: Negative aside from a partially retropharyngeal course of both carotid arteries. No cervical lymphadenopathy. Aortic arch: Moderate  mostly calcified arch atherosclerosis. Three vessel arch configuration. Right carotid system: No brachiocephalic or right CCA origin stenosis. Intermittent calcified plaque in the right CCA proximal to the bifurcation without stenosis. Right ICA origin and bulb calcified plaque but no associated stenosis. Mildly tortuous cervical right ICA with a partially retropharyngeal course. Left carotid system: No left CCA origin stenosis despite plaque. Tortuous proximal left CC trace calcified plaque proximal to the bifurcation. Bulky calcified plaque at the left ICA origin and bulb with stenosis estimated at 60-70% at the origin and then a radiographic string sign stenosis at the distal bulb level due to calcified plaque (series 5, image 66). Despite this the cervical left ICA remains patent, and has a normal caliber at the skull base. Vertebral arteries: No proximal right subclavian artery stenosis. Calcified plaque at the right vertebral artery origin and V1 segment with moderate associated stenosis. No other right vertebral artery stenosis to the skull base. No proximal left subclavian artery stenosis despite soft and calcified plaque. Calcified plaque adjacent to the left vertebral artery origin without stenosis (series 9,  image 181). Codominant vertebral arteries. No left vertebral artery stenosis to the skull base. CTA HEAD Posterior circulation: Codominant vertebral artery V4 segments without stenosis. Normal PICA origins. Normal vertebrobasilar junction. Patent basilar artery without stenosis. Normal SCA and left PCA origins. There is moderate irregularity and stenosis of both the right PCA origin and the junction with a right posterior posterior communicating artery. The left Posterior communicating artery is diminutive or absent. There is mild irregularity of the left PCA branches. There is mild irregularity in the right PCA P2 segment and inferior right P3 branches. Anterior circulation: Both ICA siphons are  patent. There is moderate to extensive bilateral ICA siphon calcified plaque. The right siphon appears mildly dominant. Up to mild bilateral ICA siphon stenosis. Normal ophthalmic and right posterior communicating artery origins. Patent carotid termini with normal MCA and right ACA origins. The left A1 segment is diminutive or absent. The anterior communicating artery and bilateral ACA branches are within normal limits. The left MCA M1 segment is tortuous and mildly irregular but with no significant stenosis. The left MCA bifurcation and left MCA branches appear within normal limits. The right MCA M1 segment is tortuous with mild irregularity but no stenosis. The right MCA trifurcation and right MCA branches are within normal limits. Venous sinuses: Patent. Incidental prominent bilateral internal jugular veins in the neck. Anatomic variants: Dominant right and diminutive or absent left ACA A1 segments. Delayed phase: No abnormal enhancement identified. Stable gray-white matter differentiation throughout the brain. Review of the MIP images confirms the above findings IMPRESSION: 1. Negative for emergent large vessel occlusion, and no core infarct or penumbra detected by CT Perfusion, but the study is positive for a high-grade RADIOGRAPHIC-STRING-SIGN stenosis of the Left ICA bulb due to bulky calcified plaque. 2. Moderate stenosis of the right vertebral artery origin due to calcified plaque. And Moderate stenosis of the proximal right PCA. 3. Mild bilateral ICA siphon stenosis due to bulky calcified plaque. No significant left carotid or left vertebral artery stenosis. 4. Stable CT appearance of the brain since 1117 hours today. Electronically Signed   By: Genevie Ann M.D.   On: 02/21/2017 13:23   Ct Angio Neck W Or Wo Contrast  Result Date: 02/21/2017 CLINICAL DATA:  Code stroke. 84 year old male with right arm and right side body weakness. Unable to walk this morning. EXAM: CT ANGIOGRAPHY HEAD AND NECK CT PERFUSION  BRAIN TECHNIQUE: Multidetector CT imaging of the head and neck was performed using the standard protocol during bolus administration of intravenous contrast. Multiplanar CT image reconstructions and MIPs were obtained to evaluate the vascular anatomy. Carotid stenosis measurements (when applicable) are obtained utilizing NASCET criteria, using the distal internal carotid diameter as the denominator. Multiphase CT imaging of the brain was performed following IV bolus contrast injection. Subsequent parametric perfusion maps were calculated using RAPID software. CONTRAST:  11mL ISOVUE-370 IOPAMIDOL (ISOVUE-370) INJECTION 76% COMPARISON:  Head CT 1117 hours today. FINDINGS: CT Brain Perfusion Findings: CBF (<30%) Volume: None Perfusion (Tmax>6.0s) volume: None Mismatch Volume: Not applicable Infarction Location:Not applicable CTA NECK Skeleton: Prior sternotomy. Degenerative changes throughout the cervical spine. No acute osseous abnormality identified. Upper chest: Negative lung apices. No superior mediastinal lymphadenopathy. Other neck: Negative aside from a partially retropharyngeal course of both carotid arteries. No cervical lymphadenopathy. Aortic arch: Moderate mostly calcified arch atherosclerosis. Three vessel arch configuration. Right carotid system: No brachiocephalic or right CCA origin stenosis. Intermittent calcified plaque in the right CCA proximal to the bifurcation without stenosis. Right ICA origin and  bulb calcified plaque but no associated stenosis. Mildly tortuous cervical right ICA with a partially retropharyngeal course. Left carotid system: No left CCA origin stenosis despite plaque. Tortuous proximal left CC trace calcified plaque proximal to the bifurcation. Bulky calcified plaque at the left ICA origin and bulb with stenosis estimated at 60-70% at the origin and then a radiographic string sign stenosis at the distal bulb level due to calcified plaque (series 5, image 66). Despite this the  cervical left ICA remains patent, and has a normal caliber at the skull base. Vertebral arteries: No proximal right subclavian artery stenosis. Calcified plaque at the right vertebral artery origin and V1 segment with moderate associated stenosis. No other right vertebral artery stenosis to the skull base. No proximal left subclavian artery stenosis despite soft and calcified plaque. Calcified plaque adjacent to the left vertebral artery origin without stenosis (series 9, image 181). Codominant vertebral arteries. No left vertebral artery stenosis to the skull base. CTA HEAD Posterior circulation: Codominant vertebral artery V4 segments without stenosis. Normal PICA origins. Normal vertebrobasilar junction. Patent basilar artery without stenosis. Normal SCA and left PCA origins. There is moderate irregularity and stenosis of both the right PCA origin and the junction with a right posterior posterior communicating artery. The left Posterior communicating artery is diminutive or absent. There is mild irregularity of the left PCA branches. There is mild irregularity in the right PCA P2 segment and inferior right P3 branches. Anterior circulation: Both ICA siphons are patent. There is moderate to extensive bilateral ICA siphon calcified plaque. The right siphon appears mildly dominant. Up to mild bilateral ICA siphon stenosis. Normal ophthalmic and right posterior communicating artery origins. Patent carotid termini with normal MCA and right ACA origins. The left A1 segment is diminutive or absent. The anterior communicating artery and bilateral ACA branches are within normal limits. The left MCA M1 segment is tortuous and mildly irregular but with no significant stenosis. The left MCA bifurcation and left MCA branches appear within normal limits. The right MCA M1 segment is tortuous with mild irregularity but no stenosis. The right MCA trifurcation and right MCA branches are within normal limits. Venous sinuses:  Patent. Incidental prominent bilateral internal jugular veins in the neck. Anatomic variants: Dominant right and diminutive or absent left ACA A1 segments. Delayed phase: No abnormal enhancement identified. Stable gray-white matter differentiation throughout the brain. Review of the MIP images confirms the above findings IMPRESSION: 1. Negative for emergent large vessel occlusion, and no core infarct or penumbra detected by CT Perfusion, but the study is positive for a high-grade RADIOGRAPHIC-STRING-SIGN stenosis of the Left ICA bulb due to bulky calcified plaque. 2. Moderate stenosis of the right vertebral artery origin due to calcified plaque. And Moderate stenosis of the proximal right PCA. 3. Mild bilateral ICA siphon stenosis due to bulky calcified plaque. No significant left carotid or left vertebral artery stenosis. 4. Stable CT appearance of the brain since 1117 hours today. Electronically Signed   By: Genevie Ann M.D.   On: 02/21/2017 13:23   Mr Brain Wo Contrast  Result Date: 02/21/2017 CLINICAL DATA:  Focal neuro deficit.  Right arm and body weakness. EXAM: MRI HEAD WITHOUT CONTRAST TECHNIQUE: Multiplanar, multiecho pulse sequences of the brain and surrounding structures were obtained without intravenous contrast. COMPARISON:  Head CT and CTA from earlier today. Brain MRI from 2003 is not available for review. FINDINGS: Brain: 1 cm acute infarct in the left centrum semiovale, roughly in line with the motor strip-correlating with presenting  symptoms. Moderate chronic small vessel ischemia in the cerebral white matter. Age concordant cerebral volume loss. No hemorrhage, hydrocephalus, or masslike finding. Vascular: Major flow voids are preserved. Skull and upper cervical spine: Negative for marrow lesion. Sinuses/Orbits: Left mastoid opacification and middle ear opacification with negative nasopharynx. Bilateral cataract resection. IMPRESSION: 1. 1 cm acute infarct in the left posterior centrum semiovale.  2. Moderate chronic small vessel ischemia in the cerebral white matter. Electronically Signed   By: Monte Fantasia M.D.   On: 02/21/2017 14:33   Ct Cerebral Perfusion W Contrast  Result Date: 02/21/2017 CLINICAL DATA:  Code stroke. 84 year old male with right arm and right side body weakness. Unable to walk this morning. EXAM: CT ANGIOGRAPHY HEAD AND NECK CT PERFUSION BRAIN TECHNIQUE: Multidetector CT imaging of the head and neck was performed using the standard protocol during bolus administration of intravenous contrast. Multiplanar CT image reconstructions and MIPs were obtained to evaluate the vascular anatomy. Carotid stenosis measurements (when applicable) are obtained utilizing NASCET criteria, using the distal internal carotid diameter as the denominator. Multiphase CT imaging of the brain was performed following IV bolus contrast injection. Subsequent parametric perfusion maps were calculated using RAPID software. CONTRAST:  127mL ISOVUE-370 IOPAMIDOL (ISOVUE-370) INJECTION 76% COMPARISON:  Head CT 1117 hours today. FINDINGS: CT Brain Perfusion Findings: CBF (<30%) Volume: None Perfusion (Tmax>6.0s) volume: None Mismatch Volume: Not applicable Infarction Location:Not applicable CTA NECK Skeleton: Prior sternotomy. Degenerative changes throughout the cervical spine. No acute osseous abnormality identified. Upper chest: Negative lung apices. No superior mediastinal lymphadenopathy. Other neck: Negative aside from a partially retropharyngeal course of both carotid arteries. No cervical lymphadenopathy. Aortic arch: Moderate mostly calcified arch atherosclerosis. Three vessel arch configuration. Right carotid system: No brachiocephalic or right CCA origin stenosis. Intermittent calcified plaque in the right CCA proximal to the bifurcation without stenosis. Right ICA origin and bulb calcified plaque but no associated stenosis. Mildly tortuous cervical right ICA with a partially retropharyngeal course. Left  carotid system: No left CCA origin stenosis despite plaque. Tortuous proximal left CC trace calcified plaque proximal to the bifurcation. Bulky calcified plaque at the left ICA origin and bulb with stenosis estimated at 60-70% at the origin and then a radiographic string sign stenosis at the distal bulb level due to calcified plaque (series 5, image 66). Despite this the cervical left ICA remains patent, and has a normal caliber at the skull base. Vertebral arteries: No proximal right subclavian artery stenosis. Calcified plaque at the right vertebral artery origin and V1 segment with moderate associated stenosis. No other right vertebral artery stenosis to the skull base. No proximal left subclavian artery stenosis despite soft and calcified plaque. Calcified plaque adjacent to the left vertebral artery origin without stenosis (series 9, image 181). Codominant vertebral arteries. No left vertebral artery stenosis to the skull base. CTA HEAD Posterior circulation: Codominant vertebral artery V4 segments without stenosis. Normal PICA origins. Normal vertebrobasilar junction. Patent basilar artery without stenosis. Normal SCA and left PCA origins. There is moderate irregularity and stenosis of both the right PCA origin and the junction with a right posterior posterior communicating artery. The left Posterior communicating artery is diminutive or absent. There is mild irregularity of the left PCA branches. There is mild irregularity in the right PCA P2 segment and inferior right P3 branches. Anterior circulation: Both ICA siphons are patent. There is moderate to extensive bilateral ICA siphon calcified plaque. The right siphon appears mildly dominant. Up to mild bilateral ICA siphon stenosis. Normal ophthalmic and right  posterior communicating artery origins. Patent carotid termini with normal MCA and right ACA origins. The left A1 segment is diminutive or absent. The anterior communicating artery and bilateral ACA  branches are within normal limits. The left MCA M1 segment is tortuous and mildly irregular but with no significant stenosis. The left MCA bifurcation and left MCA branches appear within normal limits. The right MCA M1 segment is tortuous with mild irregularity but no stenosis. The right MCA trifurcation and right MCA branches are within normal limits. Venous sinuses: Patent. Incidental prominent bilateral internal jugular veins in the neck. Anatomic variants: Dominant right and diminutive or absent left ACA A1 segments. Delayed phase: No abnormal enhancement identified. Stable gray-white matter differentiation throughout the brain. Review of the MIP images confirms the above findings IMPRESSION: 1. Negative for emergent large vessel occlusion, and no core infarct or penumbra detected by CT Perfusion, but the study is positive for a high-grade RADIOGRAPHIC-STRING-SIGN stenosis of the Left ICA bulb due to bulky calcified plaque. 2. Moderate stenosis of the right vertebral artery origin due to calcified plaque. And Moderate stenosis of the proximal right PCA. 3. Mild bilateral ICA siphon stenosis due to bulky calcified plaque. No significant left carotid or left vertebral artery stenosis. 4. Stable CT appearance of the brain since 1117 hours today. Electronically Signed   By: Genevie Ann M.D.   On: 02/21/2017 13:23   Ct Head Code Stroke Wo Contrast  Result Date: 02/21/2017 CLINICAL DATA:  Code stroke. 84 year old male with right arm and right side body weakness. Unable to walk this morning. EXAM: CT HEAD WITHOUT CONTRAST TECHNIQUE: Contiguous axial images were obtained from the base of the skull through the vertex without intravenous contrast. COMPARISON:  Cervical spine MRI 01/05/2017. FINDINGS: Brain: Patchy bilateral cerebral white matter hypodensity. Cerebral volume is within normal limits for age. No midline shift, ventriculomegaly, mass effect, evidence of mass lesion, intracranial hemorrhage or evidence of  cortically based acute infarction. No cortical encephalomalacia identified. Vascular: Calcified atherosclerosis at the skull base. No suspicious intracranial vascular hyperdensity. Skull: Negative. Sinuses/Orbits: Clear. Other: Postoperative changes to both globes. Otherwise negative Visualized orbits and scalp soft tissues. ASPECTS [facility] Stroke Program Early CT Score) - Ganglionic level infarction (caudate, lentiform nuclei, internal capsule, insula, M1-M3 cortex): 7 - Supraganglionic infarction (M4-M6 cortex): 3 Total score (0-10 with 10 being normal): 10 IMPRESSION: 1. No acute cortically based infarct or acute intracranial hemorrhage identified. ASPECTS is 10. 2. Mild to moderate for age cerebral white matter signal changes, most commonly due to chronic small vessel disease. 3. Study discussed by telephone with Dr. Vonna Kotyk LONG on 02/21/2017 at 11:33 . Electronically Signed   By: Genevie Ann M.D.   On: 02/21/2017 11:33    Scheduled Meds: . aspirin  300 mg Rectal Daily   Or  . aspirin  325 mg Oral Daily  . atorvastatin  80 mg Oral q1800  . benazepril  5 mg Oral Daily  . carbidopa-levodopa  1 tablet Oral TID WC & HS  . Carbidopa-Levodopa ER  1 tablet Oral TID WC & HS  . clopidogrel  75 mg Oral Daily  . dutasteride  0.5 mg Oral Daily  . enoxaparin (LOVENOX) injection  40 mg Subcutaneous Q24H  . ezetimibe  10 mg Oral Daily  . finasteride  5 mg Oral Daily  . gabapentin  100 mg Oral QHS  . ipratropium  1 spray Nasal Daily  . loratadine  10 mg Oral Daily  . metFORMIN  500 mg Oral TID  WC  . metoprolol succinate  25 mg Oral Daily  . nabumetone  500 mg Oral BID  . pantoprazole  40 mg Oral Daily  . polyethylene glycol  1 packet Oral Daily  . tamsulosin  0.4 mg Oral Daily   Continuous Infusions: . sodium chloride 75 mL/hr at 02/21/17 2315     LOS: 1 day    Time spent: > 35 minutes  Velvet Bathe, MD [facility] Pager [phone]  If 7PM-7AM, please contact  night-coverage [email or web address] Password [facility] 02/22/2017, 2:48 PM

## 2017-02-22 NOTE — Evaluation (Signed)
Occupational Therapy Evaluation Patient Details Name: Victor Bell MRN: [id number] DOB: 1922/04/26 Today's Date: 02/22/2017    History of Present Illness 84 y.o. male who was last seen normal last night before he went to bed.  Woke up this morning and on his way to the bathroom had to hold onto his wife because his right foot was dragging on the floor, he also had difficulty using his right arm. 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   Clinical Impression   PTA, pt was living with his wife and was independent. Pt currently requiring Mod A for grooming at sink, Max A for LB ADLs, and Mod-Max A for functional mobility with RW. Pt presenting with decreased balance, strength/FM skills in RUE, and safety awareness. Pt highly motivated to return to PLOF. Pt would benefit from further acute OT to facilitate safe dc. Recommend dc to CIR for intensive OT to optimize pt safety and independence with ADLs and functional mobility.     Follow Up Recommendations  CIR;Supervision/Assistance - 24 hour    Equipment Recommendations  Other (comment)(Defer to next veneue)    Recommendations for Other Services PT consult;Rehab consult;Speech consult     Precautions / Restrictions Precautions Precautions: Fall Restrictions Weight Bearing Restrictions: No      Mobility Bed Mobility Overal bed mobility: Needs Assistance Bed Mobility: Supine to Sit     Supine to sit: Min assist     General bed mobility comments: minA for management of LE off of bed and scooting hips to EoB. vc for hand placement to assist  Transfers Overall transfer level: Needs assistance Equipment used: Rolling walker (2 wheeled) Transfers: Sit to/from Stand Sit to Stand: Min assist         General transfer comment: Min A for stabilizing RW. Pt demonstrating good power up    Balance Overall balance assessment: Needs assistance Sitting-balance support: Feet supported;No upper extremity  supported Sitting balance-Leahy Scale: Fair Sitting balance - Comments: excessive posterior lean with MMT of LE   Standing balance support: Bilateral upper extremity supported Standing balance-Leahy Scale: Poor Standing balance comment: requires minA in RW to maintain balance                           ADL either performed or assessed with clinical judgement   ADL Overall ADL's : Needs assistance/impaired Eating/Feeding: Set up;Sitting   Grooming: Wash/dry hands;Moderate assistance;Standing Grooming Details (indicate cue type and reason): Pt washed his hands at the sink with Mod A for standing balance. Presenting with posterior lean and poor self correction Upper Body Bathing: Moderate assistance;Sitting   Lower Body Bathing: Maximal assistance;Sit to/from stand   Upper Body Dressing : Moderate assistance;Sitting   Lower Body Dressing: Maximal assistance;Sit to/from stand Lower Body Dressing Details (indicate cue type and reason): Pt able to bring ankles to knees to don/doff socks and is motivated to perform tasks. Max A for standing balance with posterior lean Toilet Transfer: Maximal assistance;Ambulation;RW(Simulated to recliner)           Functional mobility during ADLs: Maximal assistance;Rolling walker General ADL Comments: Pt with poor grasp and FM skills in RUE and decreased balance. Pt with poor safety awarness and awarness of deficits.      Vision Baseline Vision/History: Wears glasses Wears Glasses: Reading only Patient Visual Report: No change from baseline       Perception     Praxis  Pertinent Vitals/Pain Pain Assessment: Faces Faces Pain Scale: Hurts even more Pain Location: R shoulder and neck  Pain Descriptors / Indicators: Stabbing;Sharp;Shooting Pain Intervention(s): Monitored during session;Limited activity within patient's tolerance;Repositioned     Hand Dominance Right   Extremity/Trunk Assessment Upper Extremity  Assessment Upper Extremity Assessment: RUE deficits/detail RUE Deficits / Details: R rotator cuff tear, collapsed cervical disks. Decreased grasp strength and poor FM skills. Dominant hand RUE Coordination: decreased fine motor;decreased gross motor   Lower Extremity Assessment Lower Extremity Assessment: Defer to PT evaluation RLE Deficits / Details: PROM WFL, hip strength grossly 2+/5, knee and ankle strength grossly 3+/5 RLE Sensation: decreased light touch RLE Coordination: decreased gross motor;decreased fine motor   Cervical / Trunk Assessment Cervical / Trunk Assessment: Kyphotic;Other exceptions(R cervical spine pain with movement)   Communication Communication Communication: HOH   Cognition Arousal/Alertness: Awake/alert Behavior During Therapy: WFL for tasks assessed/performed;Impulsive Overall Cognitive Status: Impaired/Different from baseline Area of Impairment: Safety/judgement;Awareness                         Safety/Judgement: Decreased awareness of safety Awareness: Emergent   General Comments: Pt with decreased awareness of limitations stating "I cant walk to the bathroom" despite difficulty standing   General Comments       Exercises     Shoulder Instructions      Home Living Family/patient expects to be discharged to:: Private residence Living Arrangements: Spouse/significant other Available Help at Discharge: Family;Available PRN/intermittently Type of Home: House Home Access: Stairs to enter [employer] of Steps: 1 Entrance Stairs-Rails: None Home Layout: One level     Bathroom Shower/Tub: Teacher, early years/pre: Standard     Home Equipment: Environmental consultant - 2 wheels;Grab bars - tub/shower;Grab bars - toilet      Lives With: Spouse    Prior Functioning/Environment Level of Independence: Independent with assistive device(s)        Comments: limited community ambulator, drives, independent with ADLs and iADLs         OT Problem List: Decreased strength;Decreased range of motion;Decreased activity tolerance;Impaired balance (sitting and/or standing);Decreased safety awareness;Decreased knowledge of use of DME or AE;Impaired UE functional use;Pain      OT Treatment/Interventions: Self-care/ADL training;Therapeutic exercise;Energy conservation;DME and/or AE instruction;Therapeutic activities;Patient/family education    OT Goals(Current goals can be found in the care plan section) Acute Rehab OT Goals Patient Stated Goal: go home OT Goal Formulation: With patient Time For Goal Achievement: 03/08/17 Potential to Achieve Goals: Good  OT Frequency: Min 2X/week   Barriers to D/C:            Co-evaluation              AM-PAC PT "6 Clicks" Daily Activity     Outcome Measure Help from another person eating meals?: None Help from another person taking care of personal grooming?: A Little Help from another person toileting, which includes using toliet, bedpan, or urinal?: A Lot Help from another person bathing (including washing, rinsing, drying)?: A Lot Help from another person to put on and taking off regular upper body clothing?: A Lot Help from another person to put on and taking off regular lower body clothing?: A Lot 6 Click Score: 15   End of Session Equipment Utilized During Treatment: Gait belt;Rolling walker Nurse Communication: Mobility status  Activity Tolerance: Patient tolerated treatment well Patient left: in chair;with call bell/phone within reach;with chair alarm set  OT Visit Diagnosis: Unsteadiness on feet (R26.81);Other  abnormalities of gait and mobility (R26.89);Muscle weakness (generalized) (M62.81);Hemiplegia and hemiparesis Hemiplegia - Right/Left: Right Hemiplegia - dominant/non-dominant: Dominant Hemiplegia - caused by: Cerebral infarction                Time: 4144-3601 OT Time Calculation (min): 26 min Charges:  OT General Charges $OT Visit: 1 Visit OT  Evaluation $OT Eval Moderate Complexity: 1 Mod OT Treatments $Self Care/Home Management : 8-22 mins G-Codes:     Scherrie Seneca MSOT, OTR/L Acute Rehab Pager: [phone] Office: [place] 02/22/2017, 5:25 PM

## 2017-02-22 NOTE — Care Management Note (Signed)
Case Management Note  Patient Details  Name: Victor Bell MRN: [id number] Date of Birth: [birth year]  Subjective/Objective:   Pt admitted with CVA. He is from home with his spouse.                 Action/Plan: Awaiting decision on stent vs OR for carotid. PT recommending CIR. CM following for d/c disposition.   Expected Discharge Date:  02/23/17               Expected Discharge Plan:  [place]  In-House Referral:     Discharge planning Services  CM Consult  Post Acute Care Choice:    Choice offered to:     DME Arranged:    DME Agency:     HH Arranged:    HH Agency:     Status of Service:  In process, will continue to follow  If discussed at Long Length of Stay Meetings, dates discussed:    Additional Comments:  Pollie Friar, RN 02/22/2017, 1:05 PM

## 2017-02-22 NOTE — Progress Notes (Signed)
*  PRELIMINARY RESULTS* Echocardiogram 2D Echocardiogram has been performed.  Leavy Cella 02/22/2017, 2:25 PM

## 2017-02-22 NOTE — Evaluation (Signed)
Physical Therapy Evaluation Patient Details Name: Victor Bell MRN: [id number] DOB: 1922/02/11 Today's Date: 02/22/2017   History of Present Illness  84 y.o. male who was last seen normal last night before he went to bed.  Woke up this morning and on his way to the bathroom had to hold onto his wife because his right foot was dragging on the floor, he also had difficulty using his right arm. 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  Clinical Impression  Pt admitted with above diagnosis. Pt currently with functional limitations due to the deficits listed below (see PT Problem List). PTA pt independent in limited community ambulation, driving, independent with ADLs and iADLS. Pt limited in his current safe mobility, by R sided weakness, decreased stability and decreased safety awareness. Pt is now minA for bed mobility, and transfers and modA for lateral stepping of 3 feet from bed to recliner with RW. Pt is very motivated to return to PLOF, can tolerate 3 hrs of therapy a day and has family support making him an excellent candidate for CIR level therapy.  Pt will benefit from skilled acute PT to increase their independence and safety with mobility to allow discharge to the venue listed below.       Follow Up Recommendations CIR    Equipment Recommendations  Other (comment)(to be determined at next venue )    Recommendations for Other Services Rehab consult     Precautions / Restrictions Precautions Precautions: Fall Restrictions Weight Bearing Restrictions: No      Mobility  Bed Mobility Overal bed mobility: Needs Assistance Bed Mobility: Supine to Sit     Supine to sit: Min assist     General bed mobility comments: minA for management of LE off of bed and scooting hips to EoB. vc for hand placement to assist  Transfers Overall transfer level: Needs assistance Equipment used: Rolling walker (2 wheeled) Transfers: Sit to/from Stand Sit to  Stand: Min assist         General transfer comment: minA for powerup and steadying with RW, vc for hand placement for powerup   Ambulation/Gait Ambulation/Gait assistance: Mod assist Ambulation Distance (Feet): 3 Feet Assistive device: Rolling walker (2 wheeled) Gait Pattern/deviations: Step-to pattern;Decreased step length - right;Decreased step length - left;Shuffle Gait velocity: slowed Gait velocity interpretation: Below normal speed for age/gender General Gait Details: modA for steadying with RW to take lateral steps to recliner, requires maximal vc for slowing down and sequencing to make sure R LE has moved before trying to move L LE   Modified Rankin (Stroke Patients Only) Modified Rankin (Stroke Patients Only) Pre-Morbid Rankin Score: No symptoms Modified Rankin: Moderately severe disability     Balance Overall balance assessment: Needs assistance Sitting-balance support: Feet supported;No upper extremity supported Sitting balance-Leahy Scale: Fair Sitting balance - Comments: excessive posterior lean with MMT of LE   Standing balance support: Bilateral upper extremity supported Standing balance-Leahy Scale: Poor Standing balance comment: requires minA in RW to maintain balance                             Pertinent Vitals/Pain Pain Assessment: 0-10 Pain Score: 5  Pain Location: R shoulder and neck  Pain Descriptors / Indicators: Stabbing;Sharp;Shooting Pain Intervention(s): Limited activity within patient's tolerance;Monitored during session;Premedicated before session;Heat applied    Home Living Family/patient expects to be discharged to:: Private residence Living Arrangements: Spouse/significant other Available Help at Discharge:  Family;Available PRN/intermittently Type of Home: House Home Access: Stairs to enter Entrance Stairs-Rails: None Entrance Stairs-Number of Steps: 1 Home Layout: One level Home Equipment: Walker - 2 wheels;Grab bars -  tub/shower;Grab bars - toilet      Prior Function Level of Independence: Independent with assistive device(s)         Comments: limited community ambulator, drives, independent with ADLs and iADLs     Hand Dominance   Dominant Hand: Right    Extremity/Trunk Assessment   Upper Extremity Assessment Upper Extremity Assessment: Defer to OT evaluation RUE Deficits / Details: R rotator cuff tear, collapsed cervical disks    Lower Extremity Assessment Lower Extremity Assessment: RLE deficits/detail RLE Deficits / Details: PROM WFL, hip strength grossly 2+/5, knee and ankle strength grossly 3+/5 RLE Sensation: decreased light touch RLE Coordination: decreased gross motor;decreased fine motor    Cervical / Trunk Assessment Cervical / Trunk Assessment: Kyphotic;Other exceptions(R cervical spine pain with movement)  Communication   Communication: HOH  Cognition Arousal/Alertness: Awake/alert Behavior During Therapy: WFL for tasks assessed/performed;Impulsive Overall Cognitive Status: Within Functional Limits for tasks assessed                                        General Comments General comments (skin integrity, edema, etc.): Pt daughter and son in law present during evaluation and aided in history, pt very emotional about current deficits      Assessment/Plan    PT Assessment Patient needs continued PT services  PT Problem List Decreased strength;Decreased range of motion;Decreased activity tolerance;Decreased balance;Decreased mobility;Decreased coordination;Decreased safety awareness;Impaired sensation;Pain       PT Treatment Interventions DME instruction;Gait training;Stair training;Functional mobility training;Therapeutic activities;Therapeutic exercise;Balance training;Neuromuscular re-education;Cognitive remediation;Patient/family education    PT Goals (Current goals can be found in the Care Plan section)  Acute Rehab PT Goals Patient Stated  Goal: go home PT Goal Formulation: With patient/family Time For Goal Achievement: 03/08/17 Potential to Achieve Goals: Fair    Frequency Min 4X/week    AM-PAC PT "6 Clicks" Daily Activity  Outcome Measure Difficulty turning over in bed (including adjusting bedclothes, sheets and blankets)?: A Little Difficulty moving from lying on back to sitting on the side of the bed? : Unable Difficulty sitting down on and standing up from a chair with arms (e.g., wheelchair, bedside commode, etc,.)?: Unable Help needed moving to and from a bed to chair (including a wheelchair)?: A Lot Help needed walking in hospital room?: Total Help needed climbing 3-5 steps with a railing? : Total 6 Click Score: 9    End of Session Equipment Utilized During Treatment: Gait belt Activity Tolerance: Patient tolerated treatment well Patient left: in chair;with call bell/phone within reach;with chair alarm set;with family/visitor present Nurse Communication: Mobility status PT Visit Diagnosis: Unsteadiness on feet (R26.81);Other abnormalities of gait and mobility (R26.89);Muscle weakness (generalized) (M62.81);History of falling (Z91.81);Ataxic gait (R26.0);Difficulty in walking, not elsewhere classified (R26.2);Other symptoms and signs involving the nervous system (R29.898);Hemiplegia and hemiparesis Hemiplegia - Right/Left: Right Hemiplegia - dominant/non-dominant: Dominant Hemiplegia - caused by: Cerebral infarction    Time: 7829-5621 PT Time Calculation (min) (ACUTE ONLY): 45 min   Charges:   PT Evaluation $PT Eval Moderate Complexity: 1 Mod PT Treatments $Gait Training: 8-22 mins $Therapeutic Activity: 8-22 mins   PT G Codes:        Haely Leyland B. Migdalia Dk PT, DPT Acute Rehabilitation  [phone] Pager [phone]  [place] 02/22/2017, 11:11 AM

## 2017-02-23 LAB — GLUCOSE, CAPILLARY
Glucose-Capillary: 135 mg/dL — ABNORMAL HIGH (ref 65–99)
Glucose-Capillary: 158 mg/dL — ABNORMAL HIGH (ref 65–99)

## 2017-02-23 MED ORDER — CLOPIDOGREL BISULFATE 75 MG PO TABS: 75.0000 mg | ORAL_TABLET | Freq: Every day | ORAL | 0 refills | Status: AC

## 2017-02-23 MED ORDER — ASPIRIN 325 MG PO TABS: 325.0000 mg | ORAL_TABLET | Freq: Every day | ORAL | 0 refills | Status: DC

## 2017-02-23 NOTE — Clinical Social Work Note (Signed)
CSW received consult from nurse case manager Vida Roller regarding SNF for patient and preference of [facility]. CSW sent necessary clinicals to [facility] and made calls (messages left) with admissions SW's Tammy and Morey Hummingbird. CSW later informed by nurse case manager that family taking patient home. CSW signing off as patient discharging home with family today.  Mackensey Bolte Givens, MSW, LCSW Licensed Clinical Social Worker [place] [phone]

## 2017-02-23 NOTE — Progress Notes (Signed)
Pts daughter called RN  To notify RN that Patient is combative with family and no one knows what is going on. Pt's drt wanted RN to notify MD to stop d/c orders immediately and notify Case manger and social worker that preference will be to go to a rehab facility. Claiborne Billings RN case Freight forwarder notified.
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
## 2017-02-23 NOTE — Progress Notes (Signed)
Physical Therapy Treatment Patient Details Name: Victor Bell MRN: [id number] DOB: 05-24-1922 Today's Date: 02/23/2017    History of Present Illness 84 y.o. male who was last seen normal last night before he went to bed.  Woke up this morning and on his way to the bathroom had to hold onto his wife because his right foot was dragging on the floor, he also had difficulty using his right arm. 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    PT Comments    Pt is making good progress towards his goals, however is limited in his safe mobility by R sided weakness, decreased safety awareness and decreased knowledge of DME usage. Pt is currently minA for transfers, and modA for ambulation of 25 feet with RW.  Pt also participated in exercises, however requires constant cuing to task at hand and not showing off. D/c plans of SNF placement appropriate. PT will continue to follow acutely.  Follow Up Recommendations  SNF     Equipment Recommendations  Other (comment)(to be determined at next venue )    Recommendations for Other Services Rehab consult     Precautions / Restrictions Precautions Precautions: Fall Restrictions Weight Bearing Restrictions: No    Mobility  Bed Mobility               General bed mobility comments: OOB in recliner  Transfers Overall transfer level: Needs assistance Equipment used: Rolling walker (2 wheeled) Transfers: Sit to/from Stand Sit to Stand: Min assist         General transfer comment: minA for powerup and steadying with RW, vc for hand placement for powerup   Ambulation/Gait Ambulation/Gait assistance: Mod assist Ambulation Distance (Feet): 25 Feet Assistive device: Rolling walker (2 wheeled) Gait Pattern/deviations: Step-to pattern;Decreased step length - right;Decreased step length - left;Shuffle Gait velocity: slowed Gait velocity interpretation: Below normal speed for age/gender General Gait Details:  modA for steadying with RW, more strength in L LE able to pick up and clear foot with ambulation, max cuing to slow down and stay inside RW.     Modified Rankin (Stroke Patients Only) Modified Rankin (Stroke Patients Only) Pre-Morbid Rankin Score: No symptoms Modified Rankin: Moderately severe disability     Balance Overall balance assessment: Needs assistance Sitting-balance support: Feet supported;No upper extremity supported Sitting balance-Leahy Scale: Fair Sitting balance - Comments: excessive posterior lean with MMT of LE   Standing balance support: Bilateral upper extremity supported Standing balance-Leahy Scale: Fair Standing balance comment: able to maintain static balance with RW but does not stand still                            Cognition Arousal/Alertness: Awake/alert Behavior During Therapy: WFL for tasks assessed/performed;Impulsive Overall Cognitive Status: Within Functional Limits for tasks assessed Area of Impairment: Safety/judgement;Awareness                         Safety/Judgement: Decreased awareness of safety Awareness: Emergent   General Comments: Pt over estimates his abilities and even when he has difficulty does not admit deficit      Exercises Total Joint Exercises Standing Hip Extension: AROM;Right;10 reps;Standing General Exercises - Lower Extremity Hip ABduction/ADduction: AROM;Right;10 reps;Standing Straight Leg Raises: AROM;10 reps;Right;Standing Hip Flexion/Marching: AROM;Right;10 reps;Standing    General Comments General comments (skin integrity, edema, etc.): Pt family and pastor in room      Pertinent Vitals/Pain Pain Assessment:  No/denies pain           PT Goals (current goals can now be found in the care plan section) Acute Rehab PT Goals Patient Stated Goal: go home PT Goal Formulation: With patient/family Time For Goal Achievement: 03/08/17 Potential to Achieve Goals: Fair Progress towards PT  goals: Progressing toward goals    Frequency    Min 4X/week      PT Plan Discharge plan needs to be updated       AM-PAC PT "6 Clicks" Daily Activity  Outcome Measure  Difficulty turning over in bed (including adjusting bedclothes, sheets and blankets)?: A Little Difficulty moving from lying on back to sitting on the side of the bed? : Unable Difficulty sitting down on and standing up from a chair with arms (e.g., wheelchair, bedside commode, etc,.)?: Unable Help needed moving to and from a bed to chair (including a wheelchair)?: A Lot Help needed walking in hospital room?: A Lot Help needed climbing 3-5 steps with a railing? : Total 6 Click Score: 10    End of Session Equipment Utilized During Treatment: Gait belt Activity Tolerance: Patient tolerated treatment well Patient left: in chair;with call bell/phone within reach;with chair alarm set;with family/visitor present Nurse Communication: Mobility status PT Visit Diagnosis: Unsteadiness on feet (R26.81);Other abnormalities of gait and mobility (R26.89);Muscle weakness (generalized) (M62.81);History of falling (Z91.81);Ataxic gait (R26.0);Difficulty in walking, not elsewhere classified (R26.2);Other symptoms and signs involving the nervous system (R29.898);Hemiplegia and hemiparesis Hemiplegia - Right/Left: Right Hemiplegia - dominant/non-dominant: Dominant Hemiplegia - caused by: Cerebral infarction     Time: 1350-1414 PT Time Calculation (min) (ACUTE ONLY): 24 min  Charges:  $Gait Training: 8-22 mins $Therapeutic Exercise: 8-22 mins                    G Codes:       Adin Laker B. Migdalia Dk PT, DPT Acute Rehabilitation  [phone] Pager [phone]     [place] 02/23/2017, 2:45 PM

## 2017-02-23 NOTE — Discharge Summary (Signed)
Physician Discharge Summary  Victor Bell WIO:[id number] DOB: [birth year] DOA: 02/21/2017  PCP: Glenda Chroman, MD  Admit date: 02/21/2017 Discharge date: 02/23/2017  Time spent: > 35 minutes  Recommendations for Outpatient Follow-up:  1. Patient refused surgical treatment for his carotid artery narrowing should he change his mind he will need cardiology clearance and referral to vascular surgery Started on aspirin and plavix 2. Monitor serum creatinine within the next 1 week  Discharge Diagnoses:  Principal Problem:   Acute CVA (cerebrovascular accident) [facility]) Active Problems:   Stenosis of left carotid artery   ARF (acute renal failure) (HCC)   HTN (hypertension)   Hyperlipidemia   Discharge Condition: stable  Diet recommendation: Heart healthy  Filed Weights   02/21/17 1135 02/21/17 1925  Weight: 63.5 kg (140 lb) 65.5 kg (144 lb 6.4 oz)    History of present illness:  8 with multiple medical comorbidities including coronary artery disease, congestive heart failure, and COPD who presented with right sided weakness.  Hospital Course:  Principal Problem:   Acute CVA (cerebrovascular accident) ([place]) - MRI reporting:  1. 1 cm acute infarct in the left posterior centrum semiovale. - Currently suspected to be secondary to carotid artery narrowing. Pt refused surgical procedure offered by vascular surgery. Plan is for medical management with aspirin, statin, and plavix.  Active Problems:   Stenosis of left carotid artery - Vascular surgery on board and offered surgical procedure but patient refused. - pt on aspirin, plavix, statin.    ARF (acute renal failure) (HCC)    HTN (hypertension) - continue prior to admission medication regimen.    Hyperlipidemia - continue statin  DM - continue prior to admission medication regimen  Procedures:  None  Consultations:  Vascular surgery  Neurology  Discharge Exam: Vitals:   02/23/17 0500 02/23/17 1035  BP: (!)  157/71 131/84  Pulse: 95 93  Resp: 18 16  Temp: (!) 97.4 F (36.3 C) 99 F (37.2 C)  SpO2: 96% 94%    General: Pt in nad, alert and awake Cardiovascular: rrr, no rubs Respiratory: no increased wob, no wheezes  Discharge Instructions   Discharge Instructions    Call MD for:  severe uncontrolled pain   Complete by:  As directed    Call MD for:  temperature >100.4   Complete by:  As directed    Diet - low sodium heart healthy   Complete by:  As directed    Discharge instructions   Complete by:  As directed    F/u with your primary care physician within the next 1-2 weeks or sooner should any new concerns arise.   Increase activity slowly   Complete by:  As directed      Allergies as of 02/23/2017      Reactions   Niacin And Related Other (See Comments)   Burning       Medication List    TAKE these medications   aspirin 325 MG tablet Take 1 tablet (325 mg total) by mouth daily. Start taking on:  02/24/2017   atorvastatin 80 MG tablet Commonly known as:  LIPITOR Take 80 mg by mouth daily.   benazepril 5 MG tablet Commonly known as:  LOTENSIN Take 5 mg by mouth daily.   carbidopa-levodopa 25-100 MG tablet Commonly known as:  SINEMET IR Take 1 tablet by mouth 4 (four) times daily. Pt takes carbidopa-levodopa extended release and other at same time.   Carbidopa-Levodopa ER 25-100 MG tablet controlled release Commonly known as:  SINEMET  CR Take 1 tablet by mouth 4 (four) times daily.   cetirizine 10 MG tablet Commonly known as:  ZYRTEC Take 10 mg by mouth daily.   clopidogrel 75 MG tablet Commonly known as:  PLAVIX Take 1 tablet (75 mg total) by mouth daily. Start taking on:  02/24/2017   dutasteride 0.5 MG capsule Commonly known as:  AVODART Take 0.5 mg by mouth daily.   ezetimibe 10 MG tablet Commonly known as:  ZETIA Take 10 mg by mouth daily.   finasteride 5 MG tablet Commonly known as:  PROSCAR Take 5 mg by mouth daily.   gabapentin 100 MG  capsule Commonly known as:  NEURONTIN Take 100 mg by mouth at bedtime.   HYDROcodone-acetaminophen 5-325 MG tablet Commonly known as:  NORCO/VICODIN Take 1 tablet by mouth every 4 (four) hours as needed (pain).   ipratropium 0.06 % nasal spray Commonly known as:  ATROVENT Place 1 spray into the nose daily.   linaclotide 145 MCG Caps capsule Commonly known as:  LINZESS Take 145 mcg by mouth daily before breakfast.   metFORMIN 500 MG tablet Commonly known as:  GLUCOPHAGE Take 500 mg by mouth 3 (three) times daily.   metoprolol succinate 50 MG 24 hr tablet Commonly known as:  TOPROL-XL Take 25 mg by mouth daily.   nabumetone 500 MG tablet Commonly known as:  RELAFEN Take 500 mg by mouth 2 (two) times daily.   omeprazole 40 MG capsule Commonly known as:  PRILOSEC Take 40 mg by mouth daily.   polyethylene glycol powder powder Commonly known as:  GLYCOLAX/MIRALAX Take 1 Container by mouth daily.   tamsulosin 0.4 MG Caps capsule Commonly known as:  FLOMAX Take 0.4 mg by mouth daily.   traMADol 50 MG tablet Commonly known as:  ULTRAM Take 50 mg by mouth every 4 (four) hours.      Allergies  Allergen Reactions  . Niacin And Related Other (See Comments)    Burning       The results of significant diagnostics from this hospitalization (including imaging, microbiology, ancillary and laboratory) are listed below for reference.    Significant Diagnostic Studies: Ct Angio Head W Or Wo Contrast  Result Date: 02/21/2017 CLINICAL DATA:  Code stroke. 84 year old male with right arm and right side body weakness. Unable to walk this morning. EXAM: CT ANGIOGRAPHY HEAD AND NECK CT PERFUSION BRAIN TECHNIQUE: Multidetector CT imaging of the head and neck was performed using the standard protocol during bolus administration of intravenous contrast. Multiplanar CT image reconstructions and MIPs were obtained to evaluate the vascular anatomy. Carotid stenosis measurements (when  applicable) are obtained utilizing NASCET criteria, using the distal internal carotid diameter as the denominator. Multiphase CT imaging of the brain was performed following IV bolus contrast injection. Subsequent parametric perfusion maps were calculated using RAPID software. CONTRAST:  19mL ISOVUE-370 IOPAMIDOL (ISOVUE-370) INJECTION 76% COMPARISON:  Head CT 1117 hours today. FINDINGS: CT Brain Perfusion Findings: CBF (<30%) Volume: None Perfusion (Tmax>6.0s) volume: None Mismatch Volume: Not applicable Infarction Location:Not applicable CTA NECK Skeleton: Prior sternotomy. Degenerative changes throughout the cervical spine. No acute osseous abnormality identified. Upper chest: Negative lung apices. No superior mediastinal lymphadenopathy. Other neck: Negative aside from a partially retropharyngeal course of both carotid arteries. No cervical lymphadenopathy. Aortic arch: Moderate mostly calcified arch atherosclerosis. Three vessel arch configuration. Right carotid system: No brachiocephalic or right CCA origin stenosis. Intermittent calcified plaque in the right CCA proximal to the bifurcation without stenosis. Right ICA origin and bulb calcified  plaque but no associated stenosis. Mildly tortuous cervical right ICA with a partially retropharyngeal course. Left carotid system: No left CCA origin stenosis despite plaque. Tortuous proximal left CC trace calcified plaque proximal to the bifurcation. Bulky calcified plaque at the left ICA origin and bulb with stenosis estimated at 60-70% at the origin and then a radiographic string sign stenosis at the distal bulb level due to calcified plaque (series 5, image 66). Despite this the cervical left ICA remains patent, and has a normal caliber at the skull base. Vertebral arteries: No proximal right subclavian artery stenosis. Calcified plaque at the right vertebral artery origin and V1 segment with moderate associated stenosis. No other right vertebral artery stenosis  to the skull base. No proximal left subclavian artery stenosis despite soft and calcified plaque. Calcified plaque adjacent to the left vertebral artery origin without stenosis (series 9, image 181). Codominant vertebral arteries. No left vertebral artery stenosis to the skull base. CTA HEAD Posterior circulation: Codominant vertebral artery V4 segments without stenosis. Normal PICA origins. Normal vertebrobasilar junction. Patent basilar artery without stenosis. Normal SCA and left PCA origins. There is moderate irregularity and stenosis of both the right PCA origin and the junction with a right posterior posterior communicating artery. The left Posterior communicating artery is diminutive or absent. There is mild irregularity of the left PCA branches. There is mild irregularity in the right PCA P2 segment and inferior right P3 branches. Anterior circulation: Both ICA siphons are patent. There is moderate to extensive bilateral ICA siphon calcified plaque. The right siphon appears mildly dominant. Up to mild bilateral ICA siphon stenosis. Normal ophthalmic and right posterior communicating artery origins. Patent carotid termini with normal MCA and right ACA origins. The left A1 segment is diminutive or absent. The anterior communicating artery and bilateral ACA branches are within normal limits. The left MCA M1 segment is tortuous and mildly irregular but with no significant stenosis. The left MCA bifurcation and left MCA branches appear within normal limits. The right MCA M1 segment is tortuous with mild irregularity but no stenosis. The right MCA trifurcation and right MCA branches are within normal limits. Venous sinuses: Patent. Incidental prominent bilateral internal jugular veins in the neck. Anatomic variants: Dominant right and diminutive or absent left ACA A1 segments. Delayed phase: No abnormal enhancement identified. Stable gray-white matter differentiation throughout the brain. Review of the MIP images  confirms the above findings IMPRESSION: 1. Negative for emergent large vessel occlusion, and no core infarct or penumbra detected by CT Perfusion, but the study is positive for a high-grade RADIOGRAPHIC-STRING-SIGN stenosis of the Left ICA bulb due to bulky calcified plaque. 2. Moderate stenosis of the right vertebral artery origin due to calcified plaque. And Moderate stenosis of the proximal right PCA. 3. Mild bilateral ICA siphon stenosis due to bulky calcified plaque. No significant left carotid or left vertebral artery stenosis. 4. Stable CT appearance of the brain since 1117 hours today. Electronically Signed   By: Genevie Ann M.D.   On: 02/21/2017 13:23   Ct Angio Neck W Or Wo Contrast  Result Date: 02/21/2017 CLINICAL DATA:  Code stroke. 84 year old male with right arm and right side body weakness. Unable to walk this morning. EXAM: CT ANGIOGRAPHY HEAD AND NECK CT PERFUSION BRAIN TECHNIQUE: Multidetector CT imaging of the head and neck was performed using the standard protocol during bolus administration of intravenous contrast. Multiplanar CT image reconstructions and MIPs were obtained to evaluate the vascular anatomy. Carotid stenosis measurements (when applicable) are obtained  utilizing NASCET criteria, using the distal internal carotid diameter as the denominator. Multiphase CT imaging of the brain was performed following IV bolus contrast injection. Subsequent parametric perfusion maps were calculated using RAPID software. CONTRAST:  153mL ISOVUE-370 IOPAMIDOL (ISOVUE-370) INJECTION 76% COMPARISON:  Head CT 1117 hours today. FINDINGS: CT Brain Perfusion Findings: CBF (<30%) Volume: None Perfusion (Tmax>6.0s) volume: None Mismatch Volume: Not applicable Infarction Location:Not applicable CTA NECK Skeleton: Prior sternotomy. Degenerative changes throughout the cervical spine. No acute osseous abnormality identified. Upper chest: Negative lung apices. No superior mediastinal lymphadenopathy. Other neck:  Negative aside from a partially retropharyngeal course of both carotid arteries. No cervical lymphadenopathy. Aortic arch: Moderate mostly calcified arch atherosclerosis. Three vessel arch configuration. Right carotid system: No brachiocephalic or right CCA origin stenosis. Intermittent calcified plaque in the right CCA proximal to the bifurcation without stenosis. Right ICA origin and bulb calcified plaque but no associated stenosis. Mildly tortuous cervical right ICA with a partially retropharyngeal course. Left carotid system: No left CCA origin stenosis despite plaque. Tortuous proximal left CC trace calcified plaque proximal to the bifurcation. Bulky calcified plaque at the left ICA origin and bulb with stenosis estimated at 60-70% at the origin and then a radiographic string sign stenosis at the distal bulb level due to calcified plaque (series 5, image 66). Despite this the cervical left ICA remains patent, and has a normal caliber at the skull base. Vertebral arteries: No proximal right subclavian artery stenosis. Calcified plaque at the right vertebral artery origin and V1 segment with moderate associated stenosis. No other right vertebral artery stenosis to the skull base. No proximal left subclavian artery stenosis despite soft and calcified plaque. Calcified plaque adjacent to the left vertebral artery origin without stenosis (series 9, image 181). Codominant vertebral arteries. No left vertebral artery stenosis to the skull base. CTA HEAD Posterior circulation: Codominant vertebral artery V4 segments without stenosis. Normal PICA origins. Normal vertebrobasilar junction. Patent basilar artery without stenosis. Normal SCA and left PCA origins. There is moderate irregularity and stenosis of both the right PCA origin and the junction with a right posterior posterior communicating artery. The left Posterior communicating artery is diminutive or absent. There is mild irregularity of the left PCA branches.  There is mild irregularity in the right PCA P2 segment and inferior right P3 branches. Anterior circulation: Both ICA siphons are patent. There is moderate to extensive bilateral ICA siphon calcified plaque. The right siphon appears mildly dominant. Up to mild bilateral ICA siphon stenosis. Normal ophthalmic and right posterior communicating artery origins. Patent carotid termini with normal MCA and right ACA origins. The left A1 segment is diminutive or absent. The anterior communicating artery and bilateral ACA branches are within normal limits. The left MCA M1 segment is tortuous and mildly irregular but with no significant stenosis. The left MCA bifurcation and left MCA branches appear within normal limits. The right MCA M1 segment is tortuous with mild irregularity but no stenosis. The right MCA trifurcation and right MCA branches are within normal limits. Venous sinuses: Patent. Incidental prominent bilateral internal jugular veins in the neck. Anatomic variants: Dominant right and diminutive or absent left ACA A1 segments. Delayed phase: No abnormal enhancement identified. Stable gray-white matter differentiation throughout the brain. Review of the MIP images confirms the above findings IMPRESSION: 1. Negative for emergent large vessel occlusion, and no core infarct or penumbra detected by CT Perfusion, but the study is positive for a high-grade RADIOGRAPHIC-STRING-SIGN stenosis of the Left ICA bulb due to bulky calcified  plaque. 2. Moderate stenosis of the right vertebral artery origin due to calcified plaque. And Moderate stenosis of the proximal right PCA. 3. Mild bilateral ICA siphon stenosis due to bulky calcified plaque. No significant left carotid or left vertebral artery stenosis. 4. Stable CT appearance of the brain since 1117 hours today. Electronically Signed   By: Genevie Ann M.D.   On: 02/21/2017 13:23   Mr Brain Wo Contrast  Result Date: 02/21/2017 CLINICAL DATA:  Focal neuro deficit.  Right arm  and body weakness. EXAM: MRI HEAD WITHOUT CONTRAST TECHNIQUE: Multiplanar, multiecho pulse sequences of the brain and surrounding structures were obtained without intravenous contrast. COMPARISON:  Head CT and CTA from earlier today. Brain MRI from 2003 is not available for review. FINDINGS: Brain: 1 cm acute infarct in the left centrum semiovale, roughly in line with the motor strip-correlating with presenting symptoms. Moderate chronic small vessel ischemia in the cerebral white matter. Age concordant cerebral volume loss. No hemorrhage, hydrocephalus, or masslike finding. Vascular: Major flow voids are preserved. Skull and upper cervical spine: Negative for marrow lesion. Sinuses/Orbits: Left mastoid opacification and middle ear opacification with negative nasopharynx. Bilateral cataract resection. IMPRESSION: 1. 1 cm acute infarct in the left posterior centrum semiovale. 2. Moderate chronic small vessel ischemia in the cerebral white matter. Electronically Signed   By: Monte Fantasia M.D.   On: 02/21/2017 14:33   Ct Cerebral Perfusion W Contrast  Result Date: 02/21/2017 CLINICAL DATA:  Code stroke. 84 year old male with right arm and right side body weakness. Unable to walk this morning. EXAM: CT ANGIOGRAPHY HEAD AND NECK CT PERFUSION BRAIN TECHNIQUE: Multidetector CT imaging of the head and neck was performed using the standard protocol during bolus administration of intravenous contrast. Multiplanar CT image reconstructions and MIPs were obtained to evaluate the vascular anatomy. Carotid stenosis measurements (when applicable) are obtained utilizing NASCET criteria, using the distal internal carotid diameter as the denominator. Multiphase CT imaging of the brain was performed following IV bolus contrast injection. Subsequent parametric perfusion maps were calculated using RAPID software. CONTRAST:  133mL ISOVUE-370 IOPAMIDOL (ISOVUE-370) INJECTION 76% COMPARISON:  Head CT 1117 hours today. FINDINGS: CT  Brain Perfusion Findings: CBF (<30%) Volume: None Perfusion (Tmax>6.0s) volume: None Mismatch Volume: Not applicable Infarction Location:Not applicable CTA NECK Skeleton: Prior sternotomy. Degenerative changes throughout the cervical spine. No acute osseous abnormality identified. Upper chest: Negative lung apices. No superior mediastinal lymphadenopathy. Other neck: Negative aside from a partially retropharyngeal course of both carotid arteries. No cervical lymphadenopathy. Aortic arch: Moderate mostly calcified arch atherosclerosis. Three vessel arch configuration. Right carotid system: No brachiocephalic or right CCA origin stenosis. Intermittent calcified plaque in the right CCA proximal to the bifurcation without stenosis. Right ICA origin and bulb calcified plaque but no associated stenosis. Mildly tortuous cervical right ICA with a partially retropharyngeal course. Left carotid system: No left CCA origin stenosis despite plaque. Tortuous proximal left CC trace calcified plaque proximal to the bifurcation. Bulky calcified plaque at the left ICA origin and bulb with stenosis estimated at 60-70% at the origin and then a radiographic string sign stenosis at the distal bulb level due to calcified plaque (series 5, image 66). Despite this the cervical left ICA remains patent, and has a normal caliber at the skull base. Vertebral arteries: No proximal right subclavian artery stenosis. Calcified plaque at the right vertebral artery origin and V1 segment with moderate associated stenosis. No other right vertebral artery stenosis to the skull base. No proximal left subclavian artery stenosis despite  soft and calcified plaque. Calcified plaque adjacent to the left vertebral artery origin without stenosis (series 9, image 181). Codominant vertebral arteries. No left vertebral artery stenosis to the skull base. CTA HEAD Posterior circulation: Codominant vertebral artery V4 segments without stenosis. Normal PICA origins.  Normal vertebrobasilar junction. Patent basilar artery without stenosis. Normal SCA and left PCA origins. There is moderate irregularity and stenosis of both the right PCA origin and the junction with a right posterior posterior communicating artery. The left Posterior communicating artery is diminutive or absent. There is mild irregularity of the left PCA branches. There is mild irregularity in the right PCA P2 segment and inferior right P3 branches. Anterior circulation: Both ICA siphons are patent. There is moderate to extensive bilateral ICA siphon calcified plaque. The right siphon appears mildly dominant. Up to mild bilateral ICA siphon stenosis. Normal ophthalmic and right posterior communicating artery origins. Patent carotid termini with normal MCA and right ACA origins. The left A1 segment is diminutive or absent. The anterior communicating artery and bilateral ACA branches are within normal limits. The left MCA M1 segment is tortuous and mildly irregular but with no significant stenosis. The left MCA bifurcation and left MCA branches appear within normal limits. The right MCA M1 segment is tortuous with mild irregularity but no stenosis. The right MCA trifurcation and right MCA branches are within normal limits. Venous sinuses: Patent. Incidental prominent bilateral internal jugular veins in the neck. Anatomic variants: Dominant right and diminutive or absent left ACA A1 segments. Delayed phase: No abnormal enhancement identified. Stable gray-white matter differentiation throughout the brain. Review of the MIP images confirms the above findings IMPRESSION: 1. Negative for emergent large vessel occlusion, and no core infarct or penumbra detected by CT Perfusion, but the study is positive for a high-grade RADIOGRAPHIC-STRING-SIGN stenosis of the Left ICA bulb due to bulky calcified plaque. 2. Moderate stenosis of the right vertebral artery origin due to calcified plaque. And Moderate stenosis of the  proximal right PCA. 3. Mild bilateral ICA siphon stenosis due to bulky calcified plaque. No significant left carotid or left vertebral artery stenosis. 4. Stable CT appearance of the brain since 1117 hours today. Electronically Signed   By: Genevie Ann M.D.   On: 02/21/2017 13:23   Ct Head Code Stroke Wo Contrast  Result Date: 02/21/2017 CLINICAL DATA:  Code stroke. 84 year old male with right arm and right side body weakness. Unable to walk this morning. EXAM: CT HEAD WITHOUT CONTRAST TECHNIQUE: Contiguous axial images were obtained from the base of the skull through the vertex without intravenous contrast. COMPARISON:  Cervical spine MRI 01/05/2017. FINDINGS: Brain: Patchy bilateral cerebral white matter hypodensity. Cerebral volume is within normal limits for age. No midline shift, ventriculomegaly, mass effect, evidence of mass lesion, intracranial hemorrhage or evidence of cortically based acute infarction. No cortical encephalomalacia identified. Vascular: Calcified atherosclerosis at the skull base. No suspicious intracranial vascular hyperdensity. Skull: Negative. Sinuses/Orbits: Clear. Other: Postoperative changes to both globes. Otherwise negative Visualized orbits and scalp soft tissues. ASPECTS [facility] Stroke Program Early CT Score) - Ganglionic level infarction (caudate, lentiform nuclei, internal capsule, insula, M1-M3 cortex): 7 - Supraganglionic infarction (M4-M6 cortex): 3 Total score (0-10 with 10 being normal): 10 IMPRESSION: 1. No acute cortically based infarct or acute intracranial hemorrhage identified. ASPECTS is 10. 2. Mild to moderate for age cerebral white matter signal changes, most commonly due to chronic small vessel disease. 3. Study discussed by telephone with Dr. Vonna Kotyk LONG on 02/21/2017 at 11:33 . Electronically Signed  By: Genevie Ann M.D.   On: 02/21/2017 11:33    Microbiology: No results found for this or any previous visit (from the past 240 hour(s)).   Labs: Basic Metabolic  Panel: Recent Labs  Lab 02/21/17 1117 02/21/17 1124 02/21/17 2043  NA 136 139  --   K 4.5 4.5  --   CL 104 105  --   CO2 21*  --   --   GLUCOSE 139* 135*  --   BUN 25* 24*  --   CREATININE 1.40* 1.30* 1.30*  CALCIUM 9.3  --   --    Liver Function Tests: Recent Labs  Lab 02/21/17 1117  AST 16  ALT 8*  ALKPHOS 76  BILITOT 1.0  PROT 6.8  ALBUMIN 3.9   No results for input(s): LIPASE, AMYLASE in the last 168 hours. No results for input(s): AMMONIA in the last 168 hours. CBC: Recent Labs  Lab 02/21/17 1117 02/21/17 1124 02/21/17 2043  WBC 7.3  --  8.1  NEUTROABS 4.7  --   --   HGB 12.0* 12.2* 11.0*  HCT 36.3* 36.0* 33.1*  MCV 92.6  --  90.4  PLT 170  --  146*   Cardiac Enzymes: No results for input(s): CKTOTAL, CKMB, CKMBINDEX, TROPONINI in the last 168 hours. BNP: BNP (last 3 results) No results for input(s): BNP in the last 8760 hours.  ProBNP (last 3 results) No results for input(s): PROBNP in the last 8760 hours.  CBG: Recent Labs  Lab 02/22/17 1149 02/22/17 1629 02/22/17 2158 02/23/17 0615 02/23/17 1218  GLUCAP 117* 121* 153* 135* 158*    Signed:  Velvet Bathe MD.  [facility] 02/23/2017, 1:24 PM

## 2017-02-23 NOTE — Progress Notes (Signed)
Pt was very confused agitated and combative at 2330 and pulled out IV and did not want new access. Pt. reoriented and redirected most of the shift denied any pain at the time.MD on call made aware of pt.s change in mentation. No orders given .   reassurans and talking   Pt  finallycalm down and iv restarted this am.  Now OOB In a chair in the room  All needs met  No distress or agitation at this time. Will continue to monitor

## 2017-02-23 NOTE — Progress Notes (Signed)
    Durable Medical Equipment  (From admission, onward)        Start     Ordered   02/23/17 1450  For home use only DME 3 n 1  Once     02/23/17 1449   02/23/17 1449  For home use only DME standard manual wheelchair with seat cushion  Once    Comments:  Patient suffers from generalized weakness which impairs their ability to perform daily activities like toileting in the home.  A walker will not resolve  issue with performing activities of daily living. A wheelchair will allow patient to safely perform daily activities. Patient can safely propel the wheelchair in the home or has a caregiver who can provide assistance.  Accessories: elevating leg rests (ELRs), wheel locks, extensions and anti-tippers.   02/23/17 [place] Migdalia Dk PT, DPT Acute Rehabilitation  [phone] Pager [phone]

## 2017-02-23 NOTE — Plan of Care (Signed)
  Education: Knowledge of General Education information will improve 02/23/2017 0734 - Progressing by Marcos Eke, RN

## 2017-02-23 NOTE — Progress Notes (Signed)
   VASCULAR SURGERY ASSESSMENT & PLAN:   Dr Oneida Alar and Dr. Trula Slade have reviewed his films and feel that he is a potential candidate for a TCAR which is essentially a carotid stent that could be done under local potentially with reverse flow in order to prevent distal embolization.  Given his age and multiple medical comorbidities I do not think he is a candidate for carotid endarterectomy.  He is 84 years old and understandably wants to discuss this with his family.  He has had a stroke and given the severity of the stenosis I have explained that his risk of stroke with maximal medical therapy is approximately 10 %/year.  If he is agreeable to proceed with surgery this could be scheduled next Wednesday.  Ideally it would be best to keep him in the hospital until then although he is very anxious to go home.  He is on Plavix.   If he is agreeable to surgery then he will certainly need preoperative cardiac evaluation by his cardiologist Dr. Wynonia Lawman.   Discussed with Dr. Wendee Beavers.   SUBJECTIVE:   No specific complaints.  He is anxious to go home.  PHYSICAL EXAM:   Vitals:   02/22/17 1853 02/22/17 2110 02/23/17 0153 02/23/17 0500  BP: 132/62 (!) 162/69 (!) 155/72 (!) 157/71  Pulse: 88 74 89 95  Resp: 17 16 18 18   Temp: 98 F (36.7 C) 97.8 F (36.6 C) 98.1 F (36.7 C) (!) 97.4 F (36.3 C)  TempSrc: Oral Oral Oral Oral  SpO2: 97% 94% 96% 96%  Weight:      Height:       Mild right grip weakness. Mild right lower extremity weakness.  LABS:   Lab Results  Component Value Date   WBC 8.1 02/21/2017   HGB 11.0 (L) 02/21/2017   HCT 33.1 (L) 02/21/2017   MCV 90.4 02/21/2017   PLT 146 (L) 02/21/2017   Lab Results  Component Value Date   CREATININE 1.30 (H) 02/21/2017   Lab Results  Component Value Date   INR 0.95 02/21/2017   CBG (last 3)  Recent Labs    02/22/17 1629 02/22/17 2158 02/23/17 0615  GLUCAP 121* 153* 135*    PROBLEM LIST:    Principal Problem:   Acute CVA  (cerebrovascular accident) ([place]) Active Problems:   Stenosis of left carotid artery   ARF (acute renal failure) ([facility])   HTN (hypertension)   Hyperlipidemia   CURRENT MEDS:   . aspirin  300 mg Rectal Daily   Or  . aspirin  325 mg Oral Daily  . atorvastatin  80 mg Oral q1800  . carbidopa-levodopa  1 tablet Oral TID WC & HS  . Carbidopa-Levodopa ER  1 tablet Oral TID WC & HS  . clopidogrel  75 mg Oral Daily  . dutasteride  0.5 mg Oral Daily  . enoxaparin (LOVENOX) injection  40 mg Subcutaneous Q24H  . ezetimibe  10 mg Oral Daily  . finasteride  5 mg Oral Daily  . gabapentin  100 mg Oral QHS  . insulin aspart  0-9 Units Subcutaneous TID WC  . ipratropium  1 spray Nasal Daily  . loratadine  10 mg Oral Daily  . nabumetone  500 mg Oral BID  . pantoprazole  40 mg Oral Daily  . polyethylene glycol  1 packet Oral Daily  . tamsulosin  0.4 mg Oral Daily    Deitra Mayo Beeper: [phone] Office: [phone] 02/23/2017

## 2017-02-23 NOTE — NC FL2 (Signed)
[place] LEVEL OF CARE SCREENING TOOL     IDENTIFICATION  Patient Name: Victor Bell Birthdate: [birth year] Sex: male Admission Date (Current Location): 02/21/2017  [facility] and [place] Number:  [employer] and Address:  The . [facility], [place] [address] [place], [place] [address]      Provider Number: [id number]  Attending Physician Name and Address:  Velvet Bathe, MD  Relative Name and Phone Number:  Sonya, Pucci - GGEZMO;[phone] (h);  Zimmerman,Leann Daughter - [phone] (h), [phone] (cell)     Current Level of Care: Hospital Recommended Level of Care: [place] Prior Approval Number:    Date Approved/Denied:   PASRR Number: [id number] A(Eff. 02/23/17)  Discharge Plan: SNF    Current Diagnoses: Patient Active Problem List   Diagnosis Date Noted  . Acute CVA (cerebrovascular accident) ([place]) 02/21/2017  . Stenosis of left carotid artery 02/21/2017  . ARF (acute renal failure) ([place]) 02/21/2017  . HTN (hypertension) 02/21/2017  . Hyperlipidemia 02/21/2017  . Occlusion and stenosis of carotid artery without mention of cerebral infarction 08/03/2011    Orientation RESPIRATION BLADDER Height & Weight     Self, Situation, Place  Normal Continent Weight: 144 lb 6.4 oz (65.5 kg) Height:  5\' 7"  (170.2 cm)  BEHAVIORAL SYMPTOMS/MOOD NEUROLOGICAL BOWEL NUTRITION STATUS  Other (Comment)(Pt was very confused agitated and combative 02/22/17 at 2330 per night shift nurse, however no notes regarding any behaviors today)   Continent Diet(Low sodium - Heart healthy)  AMBULATORY STATUS COMMUNICATION OF NEEDS Skin   Extensive Assist Verbally Normal                       Personal Care Assistance Level of Assistance  Bathing, Feeding, Dressing Bathing Assistance: Maximum assistance Feeding assistance: Independent(Assistance with set up) Dressing Assistance: Maximum assistance     Functional Limitations  Info  Sight, Hearing, Speech Sight Info: Adequate(Has had cataracts removed) Hearing Info: Adequate Speech Info: Adequate    SPECIAL CARE FACTORS FREQUENCY  PT (By licensed PT), OT (By licensed OT)     PT Frequency: Evaluated 1/30 and a minimum of 4X per week recommemded during acute stay  OT Frequency: Evaluated 1/30 and a minimum of 2X per week recommended during acute stay            Contractures Contractures Info: Not present    Additional Factors Info  Code Status, Allergies, Insulin Sliding Scale Code Status Info: Full Allergies Info: Niacin and related   Insulin Sliding Scale Info: 0-9 units 3X per week with meals       Current Medications (02/23/2017):  This is the current hospital active medication list Current Facility-Administered Medications  Medication Dose Route Frequency Provider Last Rate Last Dose  . 0.9 %  sodium chloride infusion   Intravenous Continuous Isaac Bliss, Rayford Halsted, MD 75 mL/hr at 02/23/17 0423    . acetaminophen (TYLENOL) tablet 650 mg  650 mg Oral Q4H PRN Isaac Bliss, Rayford Halsted, MD       Or  . acetaminophen (TYLENOL) solution 650 mg  650 mg Per Tube Q4H PRN Isaac Bliss, Rayford Halsted, MD       Or  . acetaminophen (TYLENOL) suppository 650 mg  650 mg Rectal Q4H PRN Isaac Bliss, Rayford Halsted, MD      . aspirin suppository 300 mg  300 mg Rectal Daily Isaac Bliss, Rayford Halsted, MD       Or  . aspirin tablet 325 mg  325  mg Oral Daily Isaac Bliss, Rayford Halsted, MD   325 mg at 02/23/17 1156  . atorvastatin (LIPITOR) tablet 80 mg  80 mg Oral q1800 Isaac Bliss, Rayford Halsted, MD   80 mg at 02/22/17 1827  . carbidopa-levodopa (SINEMET IR) 25-100 MG per tablet immediate release 1 tablet  1 tablet Oral TID WC & HS Isaac Bliss, Rayford Halsted, MD   1 tablet at 02/23/17 [phone]  . Carbidopa-Levodopa ER (SINEMET CR) 25-100 MG tablet controlled release 1 tablet  1 tablet Oral TID WC & HS Isaac Bliss, Rayford Halsted, MD   1 tablet at 02/23/17 [phone]  .  clopidogrel (PLAVIX) tablet 75 mg  75 mg Oral Daily Angelia Mould, MD   75 mg at 02/23/17 1155  . dutasteride (AVODART) capsule 0.5 mg  0.5 mg Oral Daily Isaac Bliss, Rayford Halsted, MD   0.5 mg at 02/23/17 1156  . enoxaparin (LOVENOX) injection 40 mg  40 mg Subcutaneous Q24H Isaac Bliss, Rayford Halsted, MD   40 mg at 02/22/17 2208  . ezetimibe (ZETIA) tablet 10 mg  10 mg Oral Daily Isaac Bliss, Rayford Halsted, MD   10 mg at 02/22/17 1043  . finasteride (PROSCAR) tablet 5 mg  5 mg Oral Daily Isaac Bliss, Rayford Halsted, MD   5 mg at 02/23/17 1156  . gabapentin (NEURONTIN) capsule 100 mg  100 mg Oral QHS Isaac Bliss, Rayford Halsted, MD   100 mg at 02/22/17 2207  . insulin aspart (novoLOG) injection 0-9 Units  0-9 Units Subcutaneous TID WC Velvet Bathe, MD   1 Units at 02/23/17 [phone]  . ipratropium (ATROVENT) 0.06 % nasal spray 1 spray  1 spray Nasal Daily Isaac Bliss, Rayford Halsted, MD      . loratadine (CLARITIN) tablet 10 mg  10 mg Oral Daily Isaac Bliss, Rayford Halsted, MD   10 mg at 02/23/17 1155  . morphine 2 MG/ML injection 0.5 mg  0.5 mg Intravenous Q3H PRN Isaac Bliss, Rayford Halsted, MD   0.5 mg at 02/22/17 0322  . nabumetone (RELAFEN) tablet 500 mg  500 mg Oral BID Isaac Bliss, Rayford Halsted, MD   500 mg at 02/23/17 1157  . pantoprazole (PROTONIX) EC tablet 40 mg  40 mg Oral Daily Isaac Bliss, Rayford Halsted, MD   40 mg at 02/23/17 1154  . polyethylene glycol (MIRALAX / GLYCOLAX) packet 17 g  1 packet Oral Daily Isaac Bliss, Rayford Halsted, MD   17 g at 02/21/17 2307  . senna-docusate (Senokot-S) tablet 1 tablet  1 tablet Oral QHS PRN Isaac Bliss, Rayford Halsted, MD      . tamsulosin [facility]) capsule 0.4 mg  0.4 mg Oral Daily Isaac Bliss, Rayford Halsted, MD   0.4 mg at 02/23/17 1156  . traMADol (ULTRAM) tablet 50 mg  50 mg Oral Q6H PRN Isaac Bliss, Rayford Halsted, MD   50 mg at 02/23/17 2549     Discharge Medications: Please see discharge summary for a list of discharge  medications.  Relevant Imaging Results:  Relevant Lab Results:   Additional Information ss#[id number].  Sable Feil, LCSW

## 2017-02-23 NOTE — Progress Notes (Signed)
Initially patients family wanted him to go to [facility] center for rehab. Pt has not had a 3 night qualifying stay under his Medicare. Family made aware and asked for patient to remain another night. Dr Wendee Beavers notified and feels the patient is medically ready for d/c. Family updated and upset.   CM offered for out of pocket SNF placement. Family has decided to take him home. CM provided them choice of [place] agencies and they selected [facility]. They also are requesting a 3 in 1 and wheelchair. MD updated.  Butch Penny with [facility] notified of the [facility] and DME. She will have DME delivered to the room. She accepted the referral for [facility].  Family to provide transportation home.

## 2017-02-24 DIAGNOSIS — I69351 Hemiplegia and hemiparesis following cerebral infarction affecting right dominant side: Secondary | ICD-10-CM | POA: Diagnosis not present

## 2017-02-24 DIAGNOSIS — I110 Hypertensive heart disease with heart failure: Secondary | ICD-10-CM | POA: Diagnosis not present

## 2017-02-24 DIAGNOSIS — Z79899 Other long term (current) drug therapy: Secondary | ICD-10-CM | POA: Diagnosis not present

## 2017-02-24 DIAGNOSIS — Z7984 Long term (current) use of oral hypoglycemic drugs: Secondary | ICD-10-CM | POA: Diagnosis not present

## 2017-02-24 DIAGNOSIS — E785 Hyperlipidemia, unspecified: Secondary | ICD-10-CM | POA: Diagnosis not present

## 2017-02-24 DIAGNOSIS — I509 Heart failure, unspecified: Secondary | ICD-10-CM | POA: Diagnosis not present

## 2017-02-24 DIAGNOSIS — J449 Chronic obstructive pulmonary disease, unspecified: Secondary | ICD-10-CM | POA: Diagnosis not present

## 2017-02-24 DIAGNOSIS — E119 Type 2 diabetes mellitus without complications: Secondary | ICD-10-CM | POA: Diagnosis not present

## 2017-02-24 DIAGNOSIS — I2510 Atherosclerotic heart disease of native coronary artery without angina pectoris: Secondary | ICD-10-CM | POA: Diagnosis not present

## 2017-02-27 DIAGNOSIS — J449 Chronic obstructive pulmonary disease, unspecified: Secondary | ICD-10-CM | POA: Diagnosis not present

## 2017-02-27 DIAGNOSIS — I110 Hypertensive heart disease with heart failure: Secondary | ICD-10-CM | POA: Diagnosis not present

## 2017-02-27 DIAGNOSIS — E119 Type 2 diabetes mellitus without complications: Secondary | ICD-10-CM | POA: Diagnosis not present

## 2017-02-27 DIAGNOSIS — I2510 Atherosclerotic heart disease of native coronary artery without angina pectoris: Secondary | ICD-10-CM | POA: Diagnosis not present

## 2017-02-27 DIAGNOSIS — I69351 Hemiplegia and hemiparesis following cerebral infarction affecting right dominant side: Secondary | ICD-10-CM | POA: Diagnosis not present

## 2017-02-27 DIAGNOSIS — I509 Heart failure, unspecified: Secondary | ICD-10-CM | POA: Diagnosis not present

## 2017-02-28 DIAGNOSIS — I639 Cerebral infarction, unspecified: Secondary | ICD-10-CM | POA: Diagnosis not present

## 2017-02-28 DIAGNOSIS — M542 Cervicalgia: Secondary | ICD-10-CM | POA: Diagnosis not present

## 2017-02-28 DIAGNOSIS — Z09 Encounter for follow-up examination after completed treatment for conditions other than malignant neoplasm: Secondary | ICD-10-CM | POA: Diagnosis not present

## 2017-02-28 DIAGNOSIS — I6522 Occlusion and stenosis of left carotid artery: Secondary | ICD-10-CM | POA: Diagnosis not present

## 2017-02-28 DIAGNOSIS — I509 Heart failure, unspecified: Secondary | ICD-10-CM | POA: Diagnosis not present

## 2017-02-28 DIAGNOSIS — I2510 Atherosclerotic heart disease of native coronary artery without angina pectoris: Secondary | ICD-10-CM | POA: Diagnosis not present

## 2017-02-28 DIAGNOSIS — G20 Parkinson's disease: Secondary | ICD-10-CM | POA: Diagnosis not present

## 2017-02-28 DIAGNOSIS — E119 Type 2 diabetes mellitus without complications: Secondary | ICD-10-CM | POA: Diagnosis not present

## 2017-02-28 DIAGNOSIS — J449 Chronic obstructive pulmonary disease, unspecified: Secondary | ICD-10-CM | POA: Diagnosis not present

## 2017-02-28 DIAGNOSIS — I110 Hypertensive heart disease with heart failure: Secondary | ICD-10-CM | POA: Diagnosis not present

## 2017-02-28 DIAGNOSIS — I69351 Hemiplegia and hemiparesis following cerebral infarction affecting right dominant side: Secondary | ICD-10-CM | POA: Diagnosis not present

## 2017-02-28 DIAGNOSIS — Z6822 Body mass index (BMI) 22.0-22.9, adult: Secondary | ICD-10-CM | POA: Diagnosis not present

## 2017-03-02 DIAGNOSIS — E119 Type 2 diabetes mellitus without complications: Secondary | ICD-10-CM | POA: Diagnosis not present

## 2017-03-02 DIAGNOSIS — I509 Heart failure, unspecified: Secondary | ICD-10-CM | POA: Diagnosis not present

## 2017-03-02 DIAGNOSIS — I110 Hypertensive heart disease with heart failure: Secondary | ICD-10-CM | POA: Diagnosis not present

## 2017-03-02 DIAGNOSIS — J449 Chronic obstructive pulmonary disease, unspecified: Secondary | ICD-10-CM | POA: Diagnosis not present

## 2017-03-02 DIAGNOSIS — I2510 Atherosclerotic heart disease of native coronary artery without angina pectoris: Secondary | ICD-10-CM | POA: Diagnosis not present

## 2017-03-02 DIAGNOSIS — I69351 Hemiplegia and hemiparesis following cerebral infarction affecting right dominant side: Secondary | ICD-10-CM | POA: Diagnosis not present

## 2017-03-03 DIAGNOSIS — I509 Heart failure, unspecified: Secondary | ICD-10-CM | POA: Diagnosis not present

## 2017-03-03 DIAGNOSIS — J449 Chronic obstructive pulmonary disease, unspecified: Secondary | ICD-10-CM | POA: Diagnosis not present

## 2017-03-03 DIAGNOSIS — E119 Type 2 diabetes mellitus without complications: Secondary | ICD-10-CM | POA: Diagnosis not present

## 2017-03-03 DIAGNOSIS — I2510 Atherosclerotic heart disease of native coronary artery without angina pectoris: Secondary | ICD-10-CM | POA: Diagnosis not present

## 2017-03-03 DIAGNOSIS — I110 Hypertensive heart disease with heart failure: Secondary | ICD-10-CM | POA: Diagnosis not present

## 2017-03-03 DIAGNOSIS — I69351 Hemiplegia and hemiparesis following cerebral infarction affecting right dominant side: Secondary | ICD-10-CM | POA: Diagnosis not present

## 2017-03-07 DIAGNOSIS — I69351 Hemiplegia and hemiparesis following cerebral infarction affecting right dominant side: Secondary | ICD-10-CM | POA: Diagnosis not present

## 2017-03-07 DIAGNOSIS — I2510 Atherosclerotic heart disease of native coronary artery without angina pectoris: Secondary | ICD-10-CM | POA: Diagnosis not present

## 2017-03-07 DIAGNOSIS — I509 Heart failure, unspecified: Secondary | ICD-10-CM | POA: Diagnosis not present

## 2017-03-07 DIAGNOSIS — E119 Type 2 diabetes mellitus without complications: Secondary | ICD-10-CM | POA: Diagnosis not present

## 2017-03-07 DIAGNOSIS — J449 Chronic obstructive pulmonary disease, unspecified: Secondary | ICD-10-CM | POA: Diagnosis not present

## 2017-03-07 DIAGNOSIS — I110 Hypertensive heart disease with heart failure: Secondary | ICD-10-CM | POA: Diagnosis not present

## 2017-03-09 DIAGNOSIS — J449 Chronic obstructive pulmonary disease, unspecified: Secondary | ICD-10-CM | POA: Diagnosis not present

## 2017-03-09 DIAGNOSIS — E119 Type 2 diabetes mellitus without complications: Secondary | ICD-10-CM | POA: Diagnosis not present

## 2017-03-09 DIAGNOSIS — I509 Heart failure, unspecified: Secondary | ICD-10-CM | POA: Diagnosis not present

## 2017-03-09 DIAGNOSIS — I69351 Hemiplegia and hemiparesis following cerebral infarction affecting right dominant side: Secondary | ICD-10-CM | POA: Diagnosis not present

## 2017-03-09 DIAGNOSIS — I110 Hypertensive heart disease with heart failure: Secondary | ICD-10-CM | POA: Diagnosis not present

## 2017-03-09 DIAGNOSIS — I2510 Atherosclerotic heart disease of native coronary artery without angina pectoris: Secondary | ICD-10-CM | POA: Diagnosis not present

## 2017-03-13 DIAGNOSIS — I2510 Atherosclerotic heart disease of native coronary artery without angina pectoris: Secondary | ICD-10-CM | POA: Diagnosis not present

## 2017-03-13 DIAGNOSIS — I509 Heart failure, unspecified: Secondary | ICD-10-CM | POA: Diagnosis not present

## 2017-03-13 DIAGNOSIS — E119 Type 2 diabetes mellitus without complications: Secondary | ICD-10-CM | POA: Diagnosis not present

## 2017-03-13 DIAGNOSIS — I110 Hypertensive heart disease with heart failure: Secondary | ICD-10-CM | POA: Diagnosis not present

## 2017-03-13 DIAGNOSIS — I69351 Hemiplegia and hemiparesis following cerebral infarction affecting right dominant side: Secondary | ICD-10-CM | POA: Diagnosis not present

## 2017-03-13 DIAGNOSIS — J449 Chronic obstructive pulmonary disease, unspecified: Secondary | ICD-10-CM | POA: Diagnosis not present

## 2017-03-14 DIAGNOSIS — Z6822 Body mass index (BMI) 22.0-22.9, adult: Secondary | ICD-10-CM | POA: Diagnosis not present

## 2017-03-14 DIAGNOSIS — I10 Essential (primary) hypertension: Secondary | ICD-10-CM | POA: Diagnosis not present

## 2017-03-14 DIAGNOSIS — E1165 Type 2 diabetes mellitus with hyperglycemia: Secondary | ICD-10-CM | POA: Diagnosis not present

## 2017-03-14 DIAGNOSIS — E7800 Pure hypercholesterolemia, unspecified: Secondary | ICD-10-CM | POA: Diagnosis not present

## 2017-03-14 DIAGNOSIS — Z789 Other specified health status: Secondary | ICD-10-CM | POA: Diagnosis not present

## 2017-03-14 DIAGNOSIS — G20 Parkinson's disease: Secondary | ICD-10-CM | POA: Diagnosis not present

## 2017-03-14 DIAGNOSIS — I6529 Occlusion and stenosis of unspecified carotid artery: Secondary | ICD-10-CM | POA: Diagnosis not present

## 2017-03-14 DIAGNOSIS — Z299 Encounter for prophylactic measures, unspecified: Secondary | ICD-10-CM | POA: Diagnosis not present

## 2017-03-14 DIAGNOSIS — M542 Cervicalgia: Secondary | ICD-10-CM | POA: Diagnosis not present

## 2017-03-16 DIAGNOSIS — I2510 Atherosclerotic heart disease of native coronary artery without angina pectoris: Secondary | ICD-10-CM | POA: Diagnosis not present

## 2017-03-16 DIAGNOSIS — E119 Type 2 diabetes mellitus without complications: Secondary | ICD-10-CM | POA: Diagnosis not present

## 2017-03-16 DIAGNOSIS — J449 Chronic obstructive pulmonary disease, unspecified: Secondary | ICD-10-CM | POA: Diagnosis not present

## 2017-03-16 DIAGNOSIS — I69351 Hemiplegia and hemiparesis following cerebral infarction affecting right dominant side: Secondary | ICD-10-CM | POA: Diagnosis not present

## 2017-03-16 DIAGNOSIS — I110 Hypertensive heart disease with heart failure: Secondary | ICD-10-CM | POA: Diagnosis not present

## 2017-03-16 DIAGNOSIS — I509 Heart failure, unspecified: Secondary | ICD-10-CM | POA: Diagnosis not present

## 2017-03-20 DIAGNOSIS — I110 Hypertensive heart disease with heart failure: Secondary | ICD-10-CM | POA: Diagnosis not present

## 2017-03-20 DIAGNOSIS — J449 Chronic obstructive pulmonary disease, unspecified: Secondary | ICD-10-CM | POA: Diagnosis not present

## 2017-03-20 DIAGNOSIS — I69351 Hemiplegia and hemiparesis following cerebral infarction affecting right dominant side: Secondary | ICD-10-CM | POA: Diagnosis not present

## 2017-03-20 DIAGNOSIS — E119 Type 2 diabetes mellitus without complications: Secondary | ICD-10-CM | POA: Diagnosis not present

## 2017-03-20 DIAGNOSIS — I509 Heart failure, unspecified: Secondary | ICD-10-CM | POA: Diagnosis not present

## 2017-03-20 DIAGNOSIS — I2510 Atherosclerotic heart disease of native coronary artery without angina pectoris: Secondary | ICD-10-CM | POA: Diagnosis not present

## 2017-03-23 ENCOUNTER — Ambulatory Visit: Payer: Medicare Other | Admitting: Neurology

## 2017-04-04 DIAGNOSIS — E119 Type 2 diabetes mellitus without complications: Secondary | ICD-10-CM | POA: Diagnosis not present

## 2017-04-04 DIAGNOSIS — I509 Heart failure, unspecified: Secondary | ICD-10-CM | POA: Diagnosis not present

## 2017-04-04 DIAGNOSIS — I69351 Hemiplegia and hemiparesis following cerebral infarction affecting right dominant side: Secondary | ICD-10-CM | POA: Diagnosis not present

## 2017-04-04 DIAGNOSIS — I2510 Atherosclerotic heart disease of native coronary artery without angina pectoris: Secondary | ICD-10-CM | POA: Diagnosis not present

## 2017-04-04 DIAGNOSIS — I110 Hypertensive heart disease with heart failure: Secondary | ICD-10-CM | POA: Diagnosis not present

## 2017-04-04 DIAGNOSIS — J449 Chronic obstructive pulmonary disease, unspecified: Secondary | ICD-10-CM | POA: Diagnosis not present

## 2017-04-12 DIAGNOSIS — M461 Sacroiliitis, not elsewhere classified: Secondary | ICD-10-CM | POA: Diagnosis not present

## 2017-04-12 DIAGNOSIS — E1165 Type 2 diabetes mellitus with hyperglycemia: Secondary | ICD-10-CM | POA: Diagnosis not present

## 2017-04-12 DIAGNOSIS — M542 Cervicalgia: Secondary | ICD-10-CM | POA: Diagnosis not present

## 2017-04-12 DIAGNOSIS — I10 Essential (primary) hypertension: Secondary | ICD-10-CM | POA: Diagnosis not present

## 2017-04-12 DIAGNOSIS — Z6822 Body mass index (BMI) 22.0-22.9, adult: Secondary | ICD-10-CM | POA: Diagnosis not present

## 2017-04-12 DIAGNOSIS — G20 Parkinson's disease: Secondary | ICD-10-CM | POA: Diagnosis not present

## 2017-04-12 DIAGNOSIS — Z299 Encounter for prophylactic measures, unspecified: Secondary | ICD-10-CM | POA: Diagnosis not present

## 2017-04-19 ENCOUNTER — Encounter: Payer: Self-pay | Admitting: Neurology

## 2017-04-19 ENCOUNTER — Ambulatory Visit (INDEPENDENT_AMBULATORY_CARE_PROVIDER_SITE_OTHER): Payer: Medicare Other | Admitting: Neurology

## 2017-04-19 VITALS — BP 176/89 | HR 73 | Ht 67.0 in | Wt 148.0 lb

## 2017-04-19 DIAGNOSIS — I639 Cerebral infarction, unspecified: Secondary | ICD-10-CM

## 2017-04-19 DIAGNOSIS — I69359 Hemiplegia and hemiparesis following cerebral infarction affecting unspecified side: Secondary | ICD-10-CM | POA: Diagnosis not present

## 2017-04-19 DIAGNOSIS — R42 Dizziness and giddiness: Secondary | ICD-10-CM | POA: Diagnosis not present

## 2017-04-19 DIAGNOSIS — G20 Parkinson's disease: Secondary | ICD-10-CM

## 2017-04-19 NOTE — Patient Instructions (Addendum)
I think you have come a long way.  Please drink more water and eat well. You have regained some weight, which is good.  Please use your cane. We will keep your Sinemet the same.  You are at high risk from complications from taking narcotic pain medication.

## 2017-04-19 NOTE — Progress Notes (Signed)
Subjective:    Patient ID: Victor Bell is a 84 y.o. male.  HPI     Interim history:   Victor Bell is a 84 year old left-handed gentleman with an underlying medical history of diabetes, hyperlipidemia, prostate hypertrophy, hypertension, and chronic lung disease who presents for follow up consultation of his right-sided parkinsonism of about 7 years duration and also after a recent hospitalization for stroke. The patient is accompanied by his daughter again today. I last saw him on 09/19/2016, at which time he reported 3 recent falls, also lightheadedness. He was also overdoing things including painting the outside of the house and redoing the driveway. He was not always titrating well enough. He had one episode of transient one-sided weakness. We talked about gait safety and fall risk quite a bit.  Today, 04/19/2017 (all dictated new, as well as above notes, some dictation done in note pad or Word, outside of chart, may appear as copied):  He reports feeling better, strength coming back some. Had severe R shoulder pain, had injection and now on Vicodin  pill every 4 hours. Does not have an appetite, not drinking enough water. Of note, he was hospitalized right-sided weakness. I reviewed the hospital records. He was found to have acute left ischemic stroke in the left posterior centrum semiovale. This was thought to be secondary to left carotid artery stenosis. He was seen by vascular surgery but declined vascular intervention for his left carotid artery stenosis. He was managed medically with aspirin and Plavix and statin. Prior US carotids showed 60-79% stenosis in July 2014. He had SE from gabapentin, which we started for RLS, he stopped it, but Dr. Saintclair Halsted restarted it. He had to stop it again. He has had some issues with constipation, no serious problem recently. Thankfully no falls, strength is coming back gradually, uses a cane but reluctantly and also has a walker. Cognitively he is  stable.  The patient's allergies, current medications, family history, past medical history, past social history, past surgical history and problem list were reviewed and updated as appropriate.    Previously (copied from previous notes for reference):   I saw him on 03/22/2016, at which time he reported feeling fairly stable. He did have one fall a few months back, while at the beach. Thankfully, he did not hurt himself, fell in the bathroom and pulled the shower curtain down. He was having some back pain, was avoiding tramadol. He had some trouble falling asleep with a long-standing history of difficulty with sleep maintenance. He had tried sleeping pills in the past. He was on Sinemet immediate release and CR 1 pill 3 times a day. I suggested we continue with his regimen. His daughter emailed in the interim in May 2018 requesting whether we could increase his Sinemet IR and CR to 1 pill 4 times a day each. I advised her that we could try this.     I saw him on 11/17/2015, at which time he reported doing okay, he had some back pain, for which he had epidural steroid injections. He reported leg pains and history of restless leg syndrome. He had flareup of allergy symptoms, had gone through allergy shots decades ago and was on over-the-counter allergy medication. I asked him to continue with Sinemet and Sinemet CR. He was advised to avoid any decongestants over-the-counter. I suggested we start him on a trial of low-dose gabapentin for his restless leg syndrome. He was advised to avoid any benzodiazepines. His daughter emailed in November 2017 with concerns  that he still had restless leg symptoms. I suggested we gradually increased his gabapentin to 200 mg, then 300 mg daily.   Of note, they had to cancel an appointment for 10/06/2015. I saw him on 06/01/2015, at which time he reported not feeling much in the way of difference on the long acting vs. the IR C/L. He would not always remember to take the 3  pills a day, sometimes in midday he would take the IR. He had an increase in the metformin. He lost about 10 lb in 3 months. He felt that his tremor was getting worse. He had occasional lightheadedness upon standing quickly. He cannot exercise very much because of lumbar spinal stenosis and lower back pain, sometimes radiating to the left. He had no recent falls. I suggested he take 1 pill of immediate release Sinemet along with one CR 3 times a day for a total of 6 pills a day. I first met him on 03/03/2015 at the request of his primary care physician, at which time he reported a prior diagnosis of Parkinson's disease and he is to follow with a neurologist moved away. He was on Sinemet. He was not always remembering his medication. We switched his Sinemet to Sinemet CR 25-100 milligrams strength one tablet 3 times a day about 4 hourly dosing.   03/03/2015: He was previously diagnosed with Parkinson's disease. He has seen Dr. Brandon Melnick. Symptoms date back to 6 years ago with mild progression noted. He started noticing a right hand tremor at rest about 6 years ago. Symptoms have progressed very mildly over the course of time. He has been on C/L for about 4 years.    He has been on Sinemet. He is currently on 25-100 milligrams strength one pill 3 to 4 times a day. He has mild constipation which is manageable. He does not like to drink water and drinks perhaps one or 2 cups a day. He likes to drink coffee in the morning. He lives with his 67 year old wife, they have 2 grown children, both in the area. Daughter checks on them every day. He has a son who lives about 10 miles away. He worked for [employer]. Thankfully he has not fallen recently. He has a cane and a walker but does not use his walker very much. Sometimes he uses a cane inside the house. He feels that the Sinemet has been helpful. He does not always remember the midday dose and usually averages 2-3 pills a day. It is written for 4 times a day but  he does not typically take it 4 times a day. He feels that it lasts about 3 maybe 4 hours in between. He does not report any significant side effects. In particular, he denies any nausea, headache, hallucinations, he has had some blood pressure decrease with time. He has problems sleeping at night at times and is somewhat sleepy during the day and dozes off involuntarily. He has been driving but has limited his driving to daytime driving and local roads only.   He has not noticed any involuntary movements such as we would call dyskinesias. His neurologist moved away. I reviewed your office note from 01/22/2015, which you kindly included.  His Past Medical History Is Significant For: Past Medical History:  Diagnosis Date  . Arthritis   . Asthma   . CAD (coronary artery disease)   . Cancer ([place])    skin   . Carotid artery occlusion   . CHF (congestive heart failure) ([place])   .  Colon polyp   . COPD (chronic obstructive pulmonary disease) ([place])   . Diabetes mellitus   . GERD (gastroesophageal reflux disease)   . Heart disease   . Hyperlipidemia   . Hypertension   . Myocardial infarction [facility])    1998    His Past Surgical History Is Significant For: Past Surgical History:  Procedure Laterality Date  . CATARACT EXTRACTION    . CHOLECYSTECTOMY     Gall bladder  . CORONARY ARTERY BYPASS GRAFT  10/03/96   x7  . TONSILLECTOMY      His Family History Is Significant For: Family History  Problem Relation Age of Onset  . Heart disease Mother   . Aneurysm Sister     His Social History Is Significant For: Social History   Socioeconomic History  . Marital status: Married    Spouse name: Not on file  . Number of children: 1  . Years of education: 79  . Highest education level: Not on file  Occupational History  . Occupation: Retired  Scientific laboratory technician  . Financial resource strain: Not on file  . Food insecurity:    Worry: Not on file    Inability: Not on file  . Transportation  needs:    Medical: Not on file    Non-medical: Not on file  Tobacco Use  . Smoking status: Never Smoker  . Smokeless tobacco: Never Used  Substance and Sexual Activity  . Alcohol use: No    Alcohol/week: 0.0 oz  . Drug use: No  . Sexual activity: Not on file  Lifestyle  . Physical activity:    Days per week: Not on file    Minutes per session: Not on file  . Stress: Not on file  Relationships  . Social connections:    Talks on phone: Not on file    Gets together: Not on file    Attends religious service: Not on file    Active member of club or organization: Not on file    Attends meetings of clubs or organizations: Not on file    Relationship status: Not on file  Other Topics Concern  . Not on file  Social History Narrative   Drinks coffee in the morning     His Allergies Are:  Allergies  Allergen Reactions  . Niacin And Related Other (See Comments)    Burning   :   His Current Medications Are:  Outpatient Encounter Medications as of 04/19/2017  Medication Sig  . aspirin 325 MG tablet Take 1 tablet (325 mg total) by mouth daily.  Marland Kitchen atorvastatin (LIPITOR) 80 MG tablet Take 80 mg by mouth daily.    . benazepril (LOTENSIN) 5 MG tablet Take 5 mg by mouth daily.   . carbidopa-levodopa (SINEMET IR) 25-100 MG tablet Take 1 tablet by mouth 4 (four) times daily. Pt takes carbidopa-levodopa extended release and other at same time.   . Carbidopa-Levodopa ER (SINEMET CR) 25-100 MG tablet controlled release Take 1 tablet by mouth 4 (four) times daily.   . cetirizine (ZYRTEC) 10 MG tablet Take 10 mg by mouth daily.  . clopidogrel (PLAVIX) 75 MG tablet Take 1 tablet (75 mg total) by mouth daily.  Marland Kitchen dutasteride (AVODART) 0.5 MG capsule Take 0.5 mg by mouth daily.    Marland Kitchen ezetimibe (ZETIA) 10 MG tablet Take 10 mg by mouth daily.  . finasteride (PROSCAR) 5 MG tablet Take 5 mg by mouth daily.   Marland Kitchen HYDROcodone-acetaminophen (NORCO/VICODIN) 5-325 MG tablet Take 1 tablet by  mouth every 4  (four) hours as needed (pain).  Marland Kitchen ipratropium (ATROVENT) 0.06 % nasal spray Place 1 spray into the nose daily.   . metFORMIN (GLUCOPHAGE) 500 MG tablet Take 500 mg by mouth 3 (three) times daily.   . metoprolol (TOPROL-XL) 50 MG 24 hr tablet Take 25 mg by mouth daily.   . nabumetone (RELAFEN) 500 MG tablet Take 500 mg by mouth 2 (two) times daily.    Marland Kitchen omeprazole (PRILOSEC) 40 MG capsule Take 40 mg by mouth daily.   . polyethylene glycol powder (GLYCOLAX/MIRALAX) powder Take 1 Container by mouth daily.   . Tamsulosin HCl (FLOMAX) 0.4 MG CAPS Take 0.4 mg by mouth daily.    . [DISCONTINUED] traMADol (ULTRAM) 50 MG tablet Take 50 mg by mouth every 4 (four) hours.   . [DISCONTINUED] gabapentin (NEURONTIN) 100 MG capsule Take 100 mg by mouth at bedtime.   . [DISCONTINUED] linaclotide (LINZESS) 145 MCG CAPS capsule Take 145 mcg by mouth daily before breakfast.   No facility-administered encounter medications on file as of 04/19/2017.   :  Review of Systems:  Out of a complete 14 point review of systems, all are reviewed and negative with the exception of these symptoms as listed below: Review of Systems  Neurological:       Pt presents today to discuss his PD. Pt had a recent stroke. Pt's BP has been dropping low.    Objective:  Neurological Exam  Physical Exam Physical Examination:   Vitals:   04/19/17 1348  BP: (!) 176/89  Pulse: 73    General Examination: The patient is a very pleasant 84 y.o. male in no acute distress. He appears quite well, mildly deconditioned, well groomed.  HEENT:Normocephalic, atraumatic, pupils are anisocoric, reactive to light, status post bilateral cataract repairs. He has mild facial masking, mild decrease in eye blink rate, hearing is impaired, he has bilateral hearing aids in place. Mild right lower facial droop. Neck is mildly rigid, he has on oropharynx exam no significant mouth dryness, no significant drooling. He has normal tongue and palate  movements. Speech is mild to moderately hypophonic.   Chest:Clear to auscultation without wheezing, but end-inspiratory bibasilar mild crackles noted.  Heart:S1+S2+0, regular with systolic heart murmur.   Abdomen:Soft, non-tender and non-distended with normal bowel sounds appreciated on auscultation.  Extremities:There is nopitting edema in the distal lower extremities bilaterally. Pedal pulses are intact.  Skin: Warm and dry without trophic changes noted.Chronic skin changes in keeping with chronic sun exposure, no significant bruising noted.  Musculoskeletal: exam reveals no obvious joint deformities, tenderness or joint swelling or erythema with the exception of decreased range of motion in the right shoulder  Neurologically:  Mental status: The patient is awake, alert and oriented in all 4 spheres. Hisimmediate and remote memory, attention, language skills and fund of knowledge are appropriate. There is no evidence of aphasia, agnosia, apraxia or anomia. Speech is clear with normal prosody and enunciation. Thought process is linear. Mood is normaland affect is normal.  Cranial nerves II - XII are as described above under HEENT exam.  Motor exam: thin bulk, Normal strength for age on the left side, 4 out of 5 strength on the right side. Tone is mildly increased in the right upper extremity with mild intermittent resting tremor in the right hand only. He has no other resting tremor, very minimal postural and action tremor in the right upper extremity more than left. Reflexes are 1+, absent in the ankles. Fine motor skills  are mildly impaired, right side more than left. Sensory exam is intact to light touch. Romberg is not testable safely. He stands with moderate difficulty and pushes himself up, requires mild assistance. Tandem walk is not testable safely. He walks with a single-point cane on the left. On cerebellar testing, he has no dysmetria or intention tremor. Assessment and  Plan:  In summary, Victor Bell a very pleasant 84 year old malewith an underlying medical history of diabetes, cataracts, status post repair, hyperlipidemia, BPH, hypertension, who presents for follow-up consultation of his right-sided predominant Parkinson's disease of about 8 years' duration. Unfortunately, he suffered a stroke recently on the left, and was hospitalized with this, I reviewed the hospital records. He is slowly recuperating from this.He has a history of left carotid artery stenosis. He has been followed by vascular surgery for this for years. Max. Medical management was opted for. With time, he has had more balance problems,more recent falls, also recent lightheadedness, in keeping with blood pressure drops, in the context of exertion outside and also not drinking enough water. He is currently on Vicodin half a pill every 4 hours for severe right shoulder pain. I cautioned the patient with regards to narcotic pain medication and the elderly. Unfortunately, his pain was severe enough that nothing else would work and tramadol caused more side effects than the Vicodin currently. He also had side effects from gabapentin.  He is advised to be better hydrated. For nocturnal leg cramps, there is no specific medication unfortunately. A muscle relaxant is sometimes tried but he would be at risk for side effects. He is advised to eat well. He has gained a few pounds since his hospital stay. He is advised to use his cane and walker. He is advised to continue with Sinemet CR and IR 4 times a day each. I suggested a four-month follow-up, sooner if needed. I answered all their questions today and the patient and his daughter were in agreement.  I spent 30 minutes in total face-to-face time with the patient, more than 50% of which was spent in counseling and coordination of care, reviewing test results, reviewing medication and discussing or reviewing the diagnosis of PD and stroke, the prognosis and  treatment options. Pertinent laboratory and imaging test results that were available during this visit with the patient were reviewed by me and considered in my medical decision making (see chart for details).

## 2017-04-20 DIAGNOSIS — I509 Heart failure, unspecified: Secondary | ICD-10-CM | POA: Diagnosis not present

## 2017-04-20 DIAGNOSIS — I110 Hypertensive heart disease with heart failure: Secondary | ICD-10-CM | POA: Diagnosis not present

## 2017-04-20 DIAGNOSIS — J449 Chronic obstructive pulmonary disease, unspecified: Secondary | ICD-10-CM | POA: Diagnosis not present

## 2017-04-20 DIAGNOSIS — I2510 Atherosclerotic heart disease of native coronary artery without angina pectoris: Secondary | ICD-10-CM | POA: Diagnosis not present

## 2017-04-20 DIAGNOSIS — E119 Type 2 diabetes mellitus without complications: Secondary | ICD-10-CM | POA: Diagnosis not present

## 2017-04-20 DIAGNOSIS — I69351 Hemiplegia and hemiparesis following cerebral infarction affecting right dominant side: Secondary | ICD-10-CM | POA: Diagnosis not present

## 2017-04-21 DIAGNOSIS — I10 Essential (primary) hypertension: Secondary | ICD-10-CM | POA: Diagnosis not present

## 2017-04-21 DIAGNOSIS — J45909 Unspecified asthma, uncomplicated: Secondary | ICD-10-CM | POA: Diagnosis not present

## 2017-04-21 DIAGNOSIS — E119 Type 2 diabetes mellitus without complications: Secondary | ICD-10-CM | POA: Diagnosis not present

## 2017-04-21 DIAGNOSIS — I2510 Atherosclerotic heart disease of native coronary artery without angina pectoris: Secondary | ICD-10-CM | POA: Diagnosis not present

## 2017-05-15 DIAGNOSIS — Z951 Presence of aortocoronary bypass graft: Secondary | ICD-10-CM | POA: Diagnosis not present

## 2017-05-15 DIAGNOSIS — Z7902 Long term (current) use of antithrombotics/antiplatelets: Secondary | ICD-10-CM | POA: Diagnosis not present

## 2017-05-15 DIAGNOSIS — Z66 Do not resuscitate: Secondary | ICD-10-CM | POA: Diagnosis not present

## 2017-05-15 DIAGNOSIS — I2510 Atherosclerotic heart disease of native coronary artery without angina pectoris: Secondary | ICD-10-CM | POA: Diagnosis not present

## 2017-05-15 DIAGNOSIS — E119 Type 2 diabetes mellitus without complications: Secondary | ICD-10-CM | POA: Diagnosis present

## 2017-05-15 DIAGNOSIS — I6522 Occlusion and stenosis of left carotid artery: Secondary | ICD-10-CM | POA: Diagnosis present

## 2017-05-15 DIAGNOSIS — I639 Cerebral infarction, unspecified: Secondary | ICD-10-CM | POA: Diagnosis not present

## 2017-05-15 DIAGNOSIS — G20 Parkinson's disease: Secondary | ICD-10-CM | POA: Diagnosis present

## 2017-05-15 DIAGNOSIS — I6521 Occlusion and stenosis of right carotid artery: Secondary | ICD-10-CM | POA: Diagnosis not present

## 2017-05-15 DIAGNOSIS — J9811 Atelectasis: Secondary | ICD-10-CM | POA: Diagnosis not present

## 2017-05-15 DIAGNOSIS — I6509 Occlusion and stenosis of unspecified vertebral artery: Secondary | ICD-10-CM | POA: Diagnosis not present

## 2017-05-15 DIAGNOSIS — E7800 Pure hypercholesterolemia, unspecified: Secondary | ICD-10-CM | POA: Diagnosis present

## 2017-05-15 DIAGNOSIS — R200 Anesthesia of skin: Secondary | ICD-10-CM | POA: Diagnosis not present

## 2017-05-15 DIAGNOSIS — M542 Cervicalgia: Secondary | ICD-10-CM | POA: Diagnosis present

## 2017-05-15 DIAGNOSIS — G8191 Hemiplegia, unspecified affecting right dominant side: Secondary | ICD-10-CM | POA: Diagnosis not present

## 2017-05-15 DIAGNOSIS — R531 Weakness: Secondary | ICD-10-CM | POA: Diagnosis not present

## 2017-05-15 DIAGNOSIS — Z7982 Long term (current) use of aspirin: Secondary | ICD-10-CM | POA: Diagnosis not present

## 2017-05-15 DIAGNOSIS — G8929 Other chronic pain: Secondary | ICD-10-CM | POA: Diagnosis present

## 2017-05-15 DIAGNOSIS — I10 Essential (primary) hypertension: Secondary | ICD-10-CM | POA: Diagnosis present

## 2017-05-15 DIAGNOSIS — Z79899 Other long term (current) drug therapy: Secondary | ICD-10-CM | POA: Diagnosis not present

## 2017-05-15 DIAGNOSIS — I6381 Other cerebral infarction due to occlusion or stenosis of small artery: Secondary | ICD-10-CM | POA: Diagnosis not present

## 2017-05-15 DIAGNOSIS — K219 Gastro-esophageal reflux disease without esophagitis: Secondary | ICD-10-CM | POA: Diagnosis present

## 2017-05-15 DIAGNOSIS — Z7984 Long term (current) use of oral hypoglycemic drugs: Secondary | ICD-10-CM | POA: Diagnosis not present

## 2017-05-15 DIAGNOSIS — Z8673 Personal history of transient ischemic attack (TIA), and cerebral infarction without residual deficits: Secondary | ICD-10-CM | POA: Diagnosis not present

## 2017-05-25 DIAGNOSIS — M1990 Unspecified osteoarthritis, unspecified site: Secondary | ICD-10-CM | POA: Diagnosis not present

## 2017-05-25 DIAGNOSIS — Z299 Encounter for prophylactic measures, unspecified: Secondary | ICD-10-CM | POA: Diagnosis not present

## 2017-05-25 DIAGNOSIS — E1165 Type 2 diabetes mellitus with hyperglycemia: Secondary | ICD-10-CM | POA: Diagnosis not present

## 2017-05-25 DIAGNOSIS — I639 Cerebral infarction, unspecified: Secondary | ICD-10-CM | POA: Diagnosis not present

## 2017-05-25 DIAGNOSIS — Z6822 Body mass index (BMI) 22.0-22.9, adult: Secondary | ICD-10-CM | POA: Diagnosis not present

## 2017-05-25 DIAGNOSIS — Z09 Encounter for follow-up examination after completed treatment for conditions other than malignant neoplasm: Secondary | ICD-10-CM | POA: Diagnosis not present

## 2017-05-25 DIAGNOSIS — I6381 Other cerebral infarction due to occlusion or stenosis of small artery: Secondary | ICD-10-CM | POA: Diagnosis not present

## 2017-06-13 DIAGNOSIS — I119 Hypertensive heart disease without heart failure: Secondary | ICD-10-CM | POA: Diagnosis not present

## 2017-06-13 DIAGNOSIS — Z955 Presence of coronary angioplasty implant and graft: Secondary | ICD-10-CM | POA: Diagnosis not present

## 2017-06-13 DIAGNOSIS — I2510 Atherosclerotic heart disease of native coronary artery without angina pectoris: Secondary | ICD-10-CM | POA: Diagnosis not present

## 2017-06-13 DIAGNOSIS — Z951 Presence of aortocoronary bypass graft: Secondary | ICD-10-CM | POA: Diagnosis not present

## 2017-06-13 DIAGNOSIS — E785 Hyperlipidemia, unspecified: Secondary | ICD-10-CM | POA: Diagnosis not present

## 2017-06-13 DIAGNOSIS — E119 Type 2 diabetes mellitus without complications: Secondary | ICD-10-CM | POA: Diagnosis not present

## 2017-06-13 DIAGNOSIS — G20 Parkinson's disease: Secondary | ICD-10-CM | POA: Diagnosis not present

## 2017-06-13 DIAGNOSIS — K219 Gastro-esophageal reflux disease without esophagitis: Secondary | ICD-10-CM | POA: Diagnosis not present

## 2017-06-22 DIAGNOSIS — R630 Anorexia: Secondary | ICD-10-CM | POA: Diagnosis not present

## 2017-06-22 DIAGNOSIS — I10 Essential (primary) hypertension: Secondary | ICD-10-CM | POA: Diagnosis not present

## 2017-06-22 DIAGNOSIS — G20 Parkinson's disease: Secondary | ICD-10-CM | POA: Diagnosis not present

## 2017-06-22 DIAGNOSIS — Z299 Encounter for prophylactic measures, unspecified: Secondary | ICD-10-CM | POA: Diagnosis not present

## 2017-06-22 DIAGNOSIS — Z6821 Body mass index (BMI) 21.0-21.9, adult: Secondary | ICD-10-CM | POA: Diagnosis not present

## 2017-06-22 DIAGNOSIS — K589 Irritable bowel syndrome without diarrhea: Secondary | ICD-10-CM | POA: Diagnosis not present

## 2017-06-22 DIAGNOSIS — I6529 Occlusion and stenosis of unspecified carotid artery: Secondary | ICD-10-CM | POA: Diagnosis not present

## 2017-06-22 DIAGNOSIS — E1165 Type 2 diabetes mellitus with hyperglycemia: Secondary | ICD-10-CM | POA: Diagnosis not present

## 2017-06-22 DIAGNOSIS — M1990 Unspecified osteoarthritis, unspecified site: Secondary | ICD-10-CM | POA: Diagnosis not present

## 2017-06-22 DIAGNOSIS — I6381 Other cerebral infarction due to occlusion or stenosis of small artery: Secondary | ICD-10-CM | POA: Diagnosis not present

## 2017-06-22 DIAGNOSIS — E7800 Pure hypercholesterolemia, unspecified: Secondary | ICD-10-CM | POA: Diagnosis not present

## 2017-07-19 DIAGNOSIS — K589 Irritable bowel syndrome without diarrhea: Secondary | ICD-10-CM | POA: Diagnosis not present

## 2017-07-19 DIAGNOSIS — Z6821 Body mass index (BMI) 21.0-21.9, adult: Secondary | ICD-10-CM | POA: Diagnosis not present

## 2017-07-19 DIAGNOSIS — I639 Cerebral infarction, unspecified: Secondary | ICD-10-CM | POA: Diagnosis not present

## 2017-07-19 DIAGNOSIS — I10 Essential (primary) hypertension: Secondary | ICD-10-CM | POA: Diagnosis not present

## 2017-07-19 DIAGNOSIS — Z299 Encounter for prophylactic measures, unspecified: Secondary | ICD-10-CM | POA: Diagnosis not present

## 2017-07-19 DIAGNOSIS — E1165 Type 2 diabetes mellitus with hyperglycemia: Secondary | ICD-10-CM | POA: Diagnosis not present

## 2017-07-19 DIAGNOSIS — G20 Parkinson's disease: Secondary | ICD-10-CM | POA: Diagnosis not present

## 2017-07-19 DIAGNOSIS — E7800 Pure hypercholesterolemia, unspecified: Secondary | ICD-10-CM | POA: Diagnosis not present

## 2017-08-02 DIAGNOSIS — J45909 Unspecified asthma, uncomplicated: Secondary | ICD-10-CM | POA: Diagnosis not present

## 2017-08-02 DIAGNOSIS — I2510 Atherosclerotic heart disease of native coronary artery without angina pectoris: Secondary | ICD-10-CM | POA: Diagnosis not present

## 2017-08-02 DIAGNOSIS — E119 Type 2 diabetes mellitus without complications: Secondary | ICD-10-CM | POA: Diagnosis not present

## 2017-08-02 DIAGNOSIS — I10 Essential (primary) hypertension: Secondary | ICD-10-CM | POA: Diagnosis not present

## 2017-08-22 ENCOUNTER — Encounter: Payer: Self-pay | Admitting: Neurology

## 2017-08-22 ENCOUNTER — Ambulatory Visit (INDEPENDENT_AMBULATORY_CARE_PROVIDER_SITE_OTHER): Payer: Medicare Other | Admitting: Neurology

## 2017-08-22 VITALS — BP 157/80 | HR 67 | Ht 67.0 in | Wt 139.0 lb

## 2017-08-22 DIAGNOSIS — I639 Cerebral infarction, unspecified: Secondary | ICD-10-CM | POA: Diagnosis not present

## 2017-08-22 DIAGNOSIS — G2581 Restless legs syndrome: Secondary | ICD-10-CM

## 2017-08-22 DIAGNOSIS — G20 Parkinson's disease: Secondary | ICD-10-CM | POA: Diagnosis not present

## 2017-08-22 NOTE — Patient Instructions (Addendum)
Please take your Sinement IR and ER each one pill 3 times a day, at 8 AM, 2 PM, 8 PM.   You do have restless legs and cramping.   Stay well hydrated. Use your cane at all times.

## 2017-08-22 NOTE — Progress Notes (Signed)
Subjective:    Patient ID: Victor Bell is a 84 y.o. male.  HPI    Interim history:   Victor Bell is a 84 year old left-handed gentleman with an underlying medical history of diabetes, hyperlipidemia, prostate hypertrophy, hypertension, and chronic lung disease who presents for follow up consultation of his right-sided parkinsonism of about 7 years duration and also after a recent hospitalization for stroke. The patient is accompanied by his daughter, Victor Bell, again today. I last saw him on I saw him on 04/19/2017, at which time he reported recuperating slowly from his recent stroke. He was in the hospital for a acute left sided ischemic stroke deemed secondary to left carotid artery stenosis. He had developed side effects on gabapentin which was restarted by his neurosurgeon but stopped again. He had developed right shoulder pain and received an injection in it, had to take Vicodin half a pill every 4 hours.  Today, 08/22/2017 (all dictated new, as well as above notes, some dictation done in note pad or Word, outside of chart, may appear as copied):  He reports feeling stable. However, he had a recent fall. He scraped his LUE and LLE. Happened at home about 3-4 weeks ago, fell on his left side and scraped his arm and knee but also hit his mouth. He needed a dentist appointment.as I understand, he was in his garage and was trying to get away from bees or hornets. He had TIA type Sx with slurring of speech in 04/2017, went to [facility], had tele-neurology consult, stayed overnight. CT negative for acute findings, this was on 05/15/17. Heart murmur was checked out, had echo with Dr. Woody Seller and had FU with Dr. Oran Rein, routinely. Takes his C/L IR and CR two to 3 times a day. BT is around 10 ish. He no longer is on the Vicodin, no more benazepril. Less sleepy during the day. On Remeron for appetite, has since then gained a few lb.     The patient's allergies, current medications, family history, past  medical history, past social history, past surgical history and problem list were reviewed and updated as appropriate.    Previously (copied from previous notes for reference):   09/19/2016, at which time he reported 3 recent falls, also lightheadedness. He was also overdoing things including painting the outside of the house and redoing the driveway. He was not always titrating well enough. He had one episode of transient one-sided weakness. We talked about gait safety and fall risk quite a bit.    I saw him on 03/22/2016, at which time he reported feeling fairly stable. He did have one fall a few months back, while [place] the beach. Thankfully, he did not hurt himself, fell in the bathroom and pulled the shower curtain down. He was having some back pain, was avoiding tramadol. He had some trouble falling asleep with a long-standing history of difficulty with sleep maintenance. He had tried sleeping pills in the past. He was on Sinemet immediate release and CR 1 pill 3 times a day. I suggested we continue with his regimen. His daughter emailed in the interim in May 2018 requesting whether we could increase his Sinemet IR and CR to 1 pill 4 times a day each. I advised her that we could try this.   I saw him on 11/17/2015, at which time he reported doing okay, he had some back pain, for which he had epidural steroid injections. He reported leg pains and history of restless leg syndrome. He had flareup of  allergy symptoms, had gone through allergy shots decades ago and was on over-the-counter allergy medication. I asked him to continue with Sinemet and Sinemet CR. He was advised to avoid any decongestants over-the-counter. I suggested we start him on a trial of low-dose gabapentin for his restless leg syndrome. He was advised to avoid any benzodiazepines. His daughter emailed in November 2017 with concerns that he still had restless leg symptoms. I suggested we gradually increased his gabapentin to 200 mg, then  300 mg daily.   Of note, they had to cancel an appointment for 10/06/2015. I saw him on 06/01/2015, at which time he reported not feeling much in the way of difference on the long acting vs. the IR C/L. He would not always remember to take the 3 pills a day, sometimes in midday he would take the IR. He had an increase in the metformin. He lost about 10 lb in 3 months. He felt that his tremor was getting worse. He had occasional lightheadedness upon standing quickly. He cannot exercise very much because of lumbar spinal stenosis and lower back pain, sometimes radiating to the left. He had no recent falls. I suggested he take 1 pill of immediate release Sinemet along with one CR 3 times a day for a total of 6 pills a day. I first met him on 03/03/2015 at the request of his primary care physician, at which time he reported a prior diagnosis of Parkinson's disease and he is to follow with a neurologist moved away. He was on Sinemet. He was not always remembering his medication. We switched his Sinemet to Sinemet CR 25-100 milligrams strength one tablet 3 times a day about 4 hourly dosing.   03/03/2015: He was previously diagnosed with Parkinson's disease. He has seen Dr. Brandon Melnick. Symptoms date back to 6 years ago with mild progression noted. He started noticing a right hand tremor at rest about 6 years ago. Symptoms have progressed very mildly over the course of time. He has been on C/L for about 4 years.    He has been on Sinemet. He is currently on 25-100 milligrams strength one pill 3 to 4 times a day. He has mild constipation which is manageable. He does not like to drink water and drinks perhaps one or 2 cups a day. He likes to drink coffee in the morning. He lives with his 88 year old wife, they have 2 grown children, both in the area. Daughter checks on them every day. He has a son who lives about 10 miles away. He worked for [employer]. Thankfully he has not fallen recently. He has a cane and a  walker but does not use his walker very much. Sometimes he uses a cane inside the house. He feels that the Sinemet has been helpful. He does not always remember the midday dose and usually averages 2-3 pills a day. It is written for 4 times a day but he does not typically take it 4 times a day. He feels that it lasts about 3 maybe 4 hours in between. He does not report any significant side effects. In particular, he denies any nausea, headache, hallucinations, he has had some blood pressure decrease with time. He has problems sleeping at night at times and is somewhat sleepy during the day and dozes off involuntarily. He has been driving but has limited his driving to daytime driving and local roads only.   He has not noticed any involuntary movements such as we would call dyskinesias. His neurologist moved  away. I reviewed your office note from 01/22/2015, which you kindly included.  His Past Medical History Is Significant For: Past Medical History:  Diagnosis Date  . Arthritis   . Asthma   . CAD (coronary artery disease)   . Cancer ([place])    skin   . Carotid artery occlusion   . CHF (congestive heart failure) ([place])   . Colon polyp   . COPD (chronic obstructive pulmonary disease) ([place])   . Diabetes mellitus   . GERD (gastroesophageal reflux disease)   . Heart disease   . Hyperlipidemia   . Hypertension   . Myocardial infarction [facility])    1998    His Past Surgical History Is Significant For: Past Surgical History:  Procedure Laterality Date  . CATARACT EXTRACTION    . CHOLECYSTECTOMY     Gall bladder  . CORONARY ARTERY BYPASS GRAFT  10/03/96   x7  . TONSILLECTOMY      His Family History Is Significant For: Family History  Problem Relation Age of Onset  . Heart disease Mother   . Aneurysm Sister     His Social History Is Significant For: Social History   Socioeconomic History  . Marital status: Married    Spouse name: Not on file  . Number of children: 1  . Years of  education: 53  . Highest education level: Not on file  Occupational History  . Occupation: Retired  Scientific laboratory technician  . Financial resource strain: Not on file  . Food insecurity:    Worry: Not on file    Inability: Not on file  . Transportation needs:    Medical: Not on file    Non-medical: Not on file  Tobacco Use  . Smoking status: Never Smoker  . Smokeless tobacco: Never Used  Substance and Sexual Activity  . Alcohol use: No    Alcohol/week: 0.0 oz  . Drug use: No  . Sexual activity: Not on file  Lifestyle  . Physical activity:    Days per week: Not on file    Minutes per session: Not on file  . Stress: Not on file  Relationships  . Social connections:    Talks on phone: Not on file    Gets together: Not on file    Attends religious service: Not on file    Active member of club or organization: Not on file    Attends meetings of clubs or organizations: Not on file    Relationship status: Not on file  Other Topics Concern  . Not on file  Social History Narrative   Drinks coffee in the morning     His Allergies Are:  Allergies  Allergen Reactions  . Niacin And Related Other (See Comments)    Burning   :   His Current Medications Are:  Outpatient Encounter Medications as of 08/22/2017  Medication Sig  . aspirin 325 MG tablet Take 1 tablet (325 mg total) by mouth daily.  Marland Kitchen atorvastatin (LIPITOR) 80 MG tablet Take 80 mg by mouth daily.    . carbidopa-levodopa (SINEMET IR) 25-100 MG tablet Take 1 tablet by mouth 4 (four) times daily. Pt takes carbidopa-levodopa extended release and other at same time.   . Carbidopa-Levodopa ER (SINEMET CR) 25-100 MG tablet controlled release Take 1 tablet by mouth 4 (four) times daily.   . cetirizine (ZYRTEC) 10 MG tablet Take 10 mg by mouth daily.  . clopidogrel (PLAVIX) 75 MG tablet Take 1 tablet (75 mg total) by mouth daily.  Marland Kitchen  dutasteride (AVODART) 0.5 MG capsule Take 0.5 mg by mouth daily.    . finasteride (PROSCAR) 5 MG tablet  Take 5 mg by mouth daily.   Marland Kitchen ipratropium (ATROVENT) 0.06 % nasal spray Place 1 spray into the nose daily.   . metFORMIN (GLUCOPHAGE) 500 MG tablet Take 500 mg by mouth 3 (three) times daily.   . metoprolol (TOPROL-XL) 50 MG 24 hr tablet Take 25 mg by mouth daily.   . mirtazapine (REMERON) 7.5 MG tablet Take 7.5 mg by mouth every evening.  . nabumetone (RELAFEN) 500 MG tablet Take 500 mg by mouth 2 (two) times daily.    Marland Kitchen omeprazole (PRILOSEC) 40 MG capsule Take 40 mg by mouth daily.   . polyethylene glycol powder (GLYCOLAX/MIRALAX) powder Take 1 Container by mouth daily.   . Tamsulosin HCl (FLOMAX) 0.4 MG CAPS Take 0.4 mg by mouth daily.    . traMADol (ULTRAM) 50 MG tablet Take 50 mg by mouth every 6 (six) hours as needed.  . [DISCONTINUED] benazepril (LOTENSIN) 5 MG tablet Take 5 mg by mouth daily.   . [DISCONTINUED] ezetimibe (ZETIA) 10 MG tablet Take 10 mg by mouth daily.  . [DISCONTINUED] HYDROcodone-acetaminophen (NORCO/VICODIN) 5-325 MG tablet Take 1 tablet by mouth every 4 (four) hours as needed (pain).   No facility-administered encounter medications on file as of 08/22/2017.   :  Review of Systems:  Out of a complete 14 point review of systems, all are reviewed and negative with the exception of these symptoms as listed below: Review of Systems  Neurological:       Pt presents today to discuss his PD. Pt reports that he is doing well.    Objective:  Neurological Exam  Physical Exam Physical Examination:   Vitals:   08/22/17 1334  BP: (!) 157/80  Pulse: 67   General Examination: The patient is a very pleasant 84 y.o. male in no acute distress. He appears well-developed and well-nourished and well groomed.   HEENT:Normocephalic, atraumatic, pupils are anisocoric, reactive to light, status post bilateral cataract repairs. He has mild facial masking, mild decrease in eye blink rate, hearing is impaired, he has bilateral hearing aids in place. Mild right lower facial droop,  stable. Neck is mildly rigid, he has on oropharynx exam no significant mouth dryness, no significant drooling. He has normal tongue and palate movements. Speech is mild to moderately hypophonic.   Chest:Clear to auscultation without wheezing,but end-inspiratory bibasilar mild cracklesnoted.  Heart:S1+S2+0, regularwith systolic heartmurmur.   Abdomen:Soft, non-tender and non-distended with normal bowel sounds appreciated on auscultation.  Extremities:There is nopitting edema in the distal lower extremities bilaterally.   Skin: Warm and dry without trophic changes noted. Chronic skin changes in keeping with chronic sun exposure, some chronic bruising noted.  Musculoskeletal: exam reveals no obvious joint deformities, tenderness or joint swelling or erythema with the exception of decreased range of motion in the right shoulder  Neurologically:  Mental status: The patient is awake, alert and oriented in all 4 spheres. Hisimmediate and remote memory, attention, language skills and fund of knowledge are appropriate. There is no evidence of aphasia, agnosia, apraxia or anomia. Speech is clear with normal prosody and enunciation. Thought process is linear. Mood is normaland affect is normal.  Cranial nerves II - XII are as described above under HEENT exam.  Motor exam: thin bulk, Normal strength for age on the left side, 4 out of 5 strength on the right side. Tone is mildly increased in the right upper extremity  with mild intermittent resting tremor in the right hand only. He has no other resting tremor, very minimal postural and action tremor in the right upper extremity more than left. Reflexes are 1+, absent in the ankles. Fine motor skills are mild to moderately impaired, right side more than left. Sensory exam is intact to light touch. Romberg is not testable safely. He stands with moderate difficulty and pushes himself up, requires mild assistance. Tandem walk is not testable  safely. He walks with a single-point cane on the left. On cerebellar testing, he has no dysmetria or intention tremor.   Assessment and Plan:  In summary, Vernon Maish a very pleasant 84 year old malewithan underlying medical history of diabetes, cataracts, status post repair, hyperlipidemia, BPH, hypertension, who presents for follow-up consultation of his right-sided predominant Parkinson's disease of about 8+years'duration. He had a recent hospital visit in April 2019 for suspected TIA, no acute stroke was found at the time. He had a stroke in the recent past which left him with mild right-sided weakness. He was found to have left carotid artery stenosis some years ago and has been followed by vascular surgery for this. Nonsurgical treatment was opted for. He has had some recent falls and continues to be at fall risk. He had a reduction in his blood pressure medication. He had lost quite a bit of weight after he was hospitalized for a stroke and was recently started on Remeron after which he has gained about 5 pounds or so. He has been taking his Sinemet IR and ER about 3 times a day, sometimes only twice daily each. I would recommend that he continue with his immediate release and long-acting Sinemet 3 times a day, certainly okay to reduce from previously 4 times a day. He has lost quite a bit of weight and given his age I would favor the 3 times a day schedule. He is advised to try to stick with a scheduled taking the medication around 8 AM, 2 PM and 8 PM. He also has quite significant restless leg symptoms which have been ongoing for years as well. He was able to discontinue his Vicodin. He takes tramadol as needed. Previously he had side effects on gabapentin. We talked about the importance of staying better hydrated. He also reports having nocturnal leg cramps. At this juncture, he is fairly stable. I suggested a 6 month follow-up, sooner if needed. He is reminded to use his cane consistently,  change positions slowly, stay well-hydrated. I answered all their questions today and the patient and his daughter were in agreement. I spent 25 minutes in total face-to-face time with the patient, more than 50% of which was spent in counseling and coordination of care, reviewing test results, reviewing medication and discussing or reviewing the diagnosis of PD, its prognosis and treatment options. Pertinent laboratory and imaging test results that were available during this visit with the patient were reviewed by me and considered in my medical decision making (see chart for details).

## 2017-09-13 DIAGNOSIS — E119 Type 2 diabetes mellitus without complications: Secondary | ICD-10-CM | POA: Diagnosis not present

## 2017-09-13 DIAGNOSIS — I2510 Atherosclerotic heart disease of native coronary artery without angina pectoris: Secondary | ICD-10-CM | POA: Diagnosis not present

## 2017-09-13 DIAGNOSIS — J45909 Unspecified asthma, uncomplicated: Secondary | ICD-10-CM | POA: Diagnosis not present

## 2017-09-13 DIAGNOSIS — I10 Essential (primary) hypertension: Secondary | ICD-10-CM | POA: Diagnosis not present

## 2017-09-27 DIAGNOSIS — H04123 Dry eye syndrome of bilateral lacrimal glands: Secondary | ICD-10-CM | POA: Diagnosis not present

## 2017-09-27 DIAGNOSIS — H4322 Crystalline deposits in vitreous body, left eye: Secondary | ICD-10-CM | POA: Diagnosis not present

## 2017-09-27 DIAGNOSIS — Z961 Presence of intraocular lens: Secondary | ICD-10-CM | POA: Diagnosis not present

## 2017-09-27 DIAGNOSIS — E119 Type 2 diabetes mellitus without complications: Secondary | ICD-10-CM | POA: Diagnosis not present

## 2017-10-09 DIAGNOSIS — Z6821 Body mass index (BMI) 21.0-21.9, adult: Secondary | ICD-10-CM | POA: Diagnosis not present

## 2017-10-09 DIAGNOSIS — M6530 Trigger finger, unspecified finger: Secondary | ICD-10-CM | POA: Diagnosis not present

## 2017-10-09 DIAGNOSIS — I10 Essential (primary) hypertension: Secondary | ICD-10-CM | POA: Diagnosis not present

## 2017-10-09 DIAGNOSIS — G20 Parkinson's disease: Secondary | ICD-10-CM | POA: Diagnosis not present

## 2017-10-09 DIAGNOSIS — M25421 Effusion, right elbow: Secondary | ICD-10-CM | POA: Diagnosis not present

## 2017-10-09 DIAGNOSIS — Z299 Encounter for prophylactic measures, unspecified: Secondary | ICD-10-CM | POA: Diagnosis not present

## 2017-10-24 DIAGNOSIS — Z6821 Body mass index (BMI) 21.0-21.9, adult: Secondary | ICD-10-CM | POA: Diagnosis not present

## 2017-10-24 DIAGNOSIS — I10 Essential (primary) hypertension: Secondary | ICD-10-CM | POA: Diagnosis not present

## 2017-10-24 DIAGNOSIS — M533 Sacrococcygeal disorders, not elsewhere classified: Secondary | ICD-10-CM | POA: Diagnosis not present

## 2017-10-24 DIAGNOSIS — Z299 Encounter for prophylactic measures, unspecified: Secondary | ICD-10-CM | POA: Diagnosis not present

## 2017-10-24 DIAGNOSIS — R42 Dizziness and giddiness: Secondary | ICD-10-CM | POA: Diagnosis not present

## 2017-10-24 DIAGNOSIS — E1165 Type 2 diabetes mellitus with hyperglycemia: Secondary | ICD-10-CM | POA: Diagnosis not present

## 2017-10-24 DIAGNOSIS — G20 Parkinson's disease: Secondary | ICD-10-CM | POA: Diagnosis not present

## 2017-10-31 DIAGNOSIS — E1159 Type 2 diabetes mellitus with other circulatory complications: Secondary | ICD-10-CM | POA: Diagnosis not present

## 2017-10-31 DIAGNOSIS — H81393 Other peripheral vertigo, bilateral: Secondary | ICD-10-CM | POA: Diagnosis not present

## 2017-10-31 DIAGNOSIS — E1140 Type 2 diabetes mellitus with diabetic neuropathy, unspecified: Secondary | ICD-10-CM | POA: Diagnosis not present

## 2017-11-08 DIAGNOSIS — I10 Essential (primary) hypertension: Secondary | ICD-10-CM | POA: Diagnosis not present

## 2017-11-08 DIAGNOSIS — E119 Type 2 diabetes mellitus without complications: Secondary | ICD-10-CM | POA: Diagnosis not present

## 2017-11-08 DIAGNOSIS — J45909 Unspecified asthma, uncomplicated: Secondary | ICD-10-CM | POA: Diagnosis not present

## 2017-11-08 DIAGNOSIS — I2510 Atherosclerotic heart disease of native coronary artery without angina pectoris: Secondary | ICD-10-CM | POA: Diagnosis not present

## 2017-11-10 DIAGNOSIS — I639 Cerebral infarction, unspecified: Secondary | ICD-10-CM | POA: Diagnosis not present

## 2017-11-10 DIAGNOSIS — G20 Parkinson's disease: Secondary | ICD-10-CM | POA: Diagnosis not present

## 2017-11-10 DIAGNOSIS — Z6821 Body mass index (BMI) 21.0-21.9, adult: Secondary | ICD-10-CM | POA: Diagnosis not present

## 2017-11-10 DIAGNOSIS — E7800 Pure hypercholesterolemia, unspecified: Secondary | ICD-10-CM | POA: Diagnosis not present

## 2017-11-10 DIAGNOSIS — R42 Dizziness and giddiness: Secondary | ICD-10-CM | POA: Diagnosis not present

## 2017-11-10 DIAGNOSIS — Z299 Encounter for prophylactic measures, unspecified: Secondary | ICD-10-CM | POA: Diagnosis not present

## 2017-11-27 DIAGNOSIS — R5383 Other fatigue: Secondary | ICD-10-CM | POA: Diagnosis not present

## 2017-11-27 DIAGNOSIS — I10 Essential (primary) hypertension: Secondary | ICD-10-CM | POA: Diagnosis not present

## 2017-11-27 DIAGNOSIS — Z0000 Encounter for general adult medical examination without abnormal findings: Secondary | ICD-10-CM | POA: Diagnosis not present

## 2017-11-27 DIAGNOSIS — Z7189 Other specified counseling: Secondary | ICD-10-CM | POA: Diagnosis not present

## 2017-11-27 DIAGNOSIS — Z125 Encounter for screening for malignant neoplasm of prostate: Secondary | ICD-10-CM | POA: Diagnosis not present

## 2017-11-27 DIAGNOSIS — E7800 Pure hypercholesterolemia, unspecified: Secondary | ICD-10-CM | POA: Diagnosis not present

## 2017-11-27 DIAGNOSIS — Z6821 Body mass index (BMI) 21.0-21.9, adult: Secondary | ICD-10-CM | POA: Diagnosis not present

## 2017-11-27 DIAGNOSIS — Z79899 Other long term (current) drug therapy: Secondary | ICD-10-CM | POA: Diagnosis not present

## 2017-11-27 DIAGNOSIS — Z1211 Encounter for screening for malignant neoplasm of colon: Secondary | ICD-10-CM | POA: Diagnosis not present

## 2017-11-27 DIAGNOSIS — Z1331 Encounter for screening for depression: Secondary | ICD-10-CM | POA: Diagnosis not present

## 2017-11-27 DIAGNOSIS — Z299 Encounter for prophylactic measures, unspecified: Secondary | ICD-10-CM | POA: Diagnosis not present

## 2017-12-18 DIAGNOSIS — E119 Type 2 diabetes mellitus without complications: Secondary | ICD-10-CM | POA: Diagnosis not present

## 2017-12-18 DIAGNOSIS — I10 Essential (primary) hypertension: Secondary | ICD-10-CM | POA: Diagnosis not present

## 2017-12-18 DIAGNOSIS — I2510 Atherosclerotic heart disease of native coronary artery without angina pectoris: Secondary | ICD-10-CM | POA: Diagnosis not present

## 2017-12-18 DIAGNOSIS — J45909 Unspecified asthma, uncomplicated: Secondary | ICD-10-CM | POA: Diagnosis not present

## 2018-01-10 DIAGNOSIS — R5383 Other fatigue: Secondary | ICD-10-CM | POA: Diagnosis not present

## 2018-01-12 DIAGNOSIS — I10 Essential (primary) hypertension: Secondary | ICD-10-CM | POA: Diagnosis not present

## 2018-01-12 DIAGNOSIS — I2510 Atherosclerotic heart disease of native coronary artery without angina pectoris: Secondary | ICD-10-CM | POA: Diagnosis not present

## 2018-01-12 DIAGNOSIS — J45909 Unspecified asthma, uncomplicated: Secondary | ICD-10-CM | POA: Diagnosis not present

## 2018-01-12 DIAGNOSIS — E119 Type 2 diabetes mellitus without complications: Secondary | ICD-10-CM | POA: Diagnosis not present

## 2018-01-29 DIAGNOSIS — M545 Low back pain: Secondary | ICD-10-CM | POA: Diagnosis not present

## 2018-01-29 DIAGNOSIS — I10 Essential (primary) hypertension: Secondary | ICD-10-CM | POA: Diagnosis not present

## 2018-01-29 DIAGNOSIS — E1165 Type 2 diabetes mellitus with hyperglycemia: Secondary | ICD-10-CM | POA: Diagnosis not present

## 2018-01-29 DIAGNOSIS — Z299 Encounter for prophylactic measures, unspecified: Secondary | ICD-10-CM | POA: Diagnosis not present

## 2018-01-29 DIAGNOSIS — M461 Sacroiliitis, not elsewhere classified: Secondary | ICD-10-CM | POA: Diagnosis not present

## 2018-02-14 DIAGNOSIS — J45909 Unspecified asthma, uncomplicated: Secondary | ICD-10-CM | POA: Diagnosis not present

## 2018-02-14 DIAGNOSIS — I2510 Atherosclerotic heart disease of native coronary artery without angina pectoris: Secondary | ICD-10-CM | POA: Diagnosis not present

## 2018-02-14 DIAGNOSIS — I10 Essential (primary) hypertension: Secondary | ICD-10-CM | POA: Diagnosis not present

## 2018-02-14 DIAGNOSIS — E119 Type 2 diabetes mellitus without complications: Secondary | ICD-10-CM | POA: Diagnosis not present

## 2018-02-20 DIAGNOSIS — D649 Anemia, unspecified: Secondary | ICD-10-CM | POA: Diagnosis not present

## 2018-02-22 ENCOUNTER — Telehealth: Payer: Self-pay

## 2018-02-22 ENCOUNTER — Ambulatory Visit: Payer: Medicare Other | Admitting: Neurology

## 2018-02-22 NOTE — Telephone Encounter (Signed)
Pt did not show for their appt with Dr. Athar today.  

## 2018-02-27 ENCOUNTER — Encounter: Payer: Self-pay | Admitting: Neurology

## 2018-03-14 DIAGNOSIS — E119 Type 2 diabetes mellitus without complications: Secondary | ICD-10-CM | POA: Diagnosis not present

## 2018-03-14 DIAGNOSIS — I10 Essential (primary) hypertension: Secondary | ICD-10-CM | POA: Diagnosis not present

## 2018-03-14 DIAGNOSIS — J45909 Unspecified asthma, uncomplicated: Secondary | ICD-10-CM | POA: Diagnosis not present

## 2018-03-14 DIAGNOSIS — I2510 Atherosclerotic heart disease of native coronary artery without angina pectoris: Secondary | ICD-10-CM | POA: Diagnosis not present

## 2018-03-20 DIAGNOSIS — G20 Parkinson's disease: Secondary | ICD-10-CM | POA: Diagnosis not present

## 2018-03-20 DIAGNOSIS — Z6822 Body mass index (BMI) 22.0-22.9, adult: Secondary | ICD-10-CM | POA: Diagnosis not present

## 2018-03-20 DIAGNOSIS — E1165 Type 2 diabetes mellitus with hyperglycemia: Secondary | ICD-10-CM | POA: Diagnosis not present

## 2018-03-20 DIAGNOSIS — I10 Essential (primary) hypertension: Secondary | ICD-10-CM | POA: Diagnosis not present

## 2018-03-20 DIAGNOSIS — Z299 Encounter for prophylactic measures, unspecified: Secondary | ICD-10-CM | POA: Diagnosis not present

## 2018-03-20 DIAGNOSIS — M461 Sacroiliitis, not elsewhere classified: Secondary | ICD-10-CM | POA: Diagnosis not present

## 2018-03-29 DIAGNOSIS — I10 Essential (primary) hypertension: Secondary | ICD-10-CM | POA: Diagnosis not present

## 2018-03-29 DIAGNOSIS — Z6822 Body mass index (BMI) 22.0-22.9, adult: Secondary | ICD-10-CM | POA: Diagnosis not present

## 2018-03-29 DIAGNOSIS — M6530 Trigger finger, unspecified finger: Secondary | ICD-10-CM | POA: Diagnosis not present

## 2018-03-29 DIAGNOSIS — Z299 Encounter for prophylactic measures, unspecified: Secondary | ICD-10-CM | POA: Diagnosis not present

## 2018-04-12 DIAGNOSIS — J45909 Unspecified asthma, uncomplicated: Secondary | ICD-10-CM | POA: Diagnosis not present

## 2018-04-12 DIAGNOSIS — E119 Type 2 diabetes mellitus without complications: Secondary | ICD-10-CM | POA: Diagnosis not present

## 2018-04-12 DIAGNOSIS — I2510 Atherosclerotic heart disease of native coronary artery without angina pectoris: Secondary | ICD-10-CM | POA: Diagnosis not present

## 2018-04-12 DIAGNOSIS — I10 Essential (primary) hypertension: Secondary | ICD-10-CM | POA: Diagnosis not present

## 2018-05-07 DIAGNOSIS — Z6821 Body mass index (BMI) 21.0-21.9, adult: Secondary | ICD-10-CM | POA: Diagnosis not present

## 2018-05-07 DIAGNOSIS — Z299 Encounter for prophylactic measures, unspecified: Secondary | ICD-10-CM | POA: Diagnosis not present

## 2018-05-07 DIAGNOSIS — I10 Essential (primary) hypertension: Secondary | ICD-10-CM | POA: Diagnosis not present

## 2018-05-07 DIAGNOSIS — E1165 Type 2 diabetes mellitus with hyperglycemia: Secondary | ICD-10-CM | POA: Diagnosis not present

## 2018-05-07 DIAGNOSIS — G20 Parkinson's disease: Secondary | ICD-10-CM | POA: Diagnosis not present

## 2018-05-07 DIAGNOSIS — M461 Sacroiliitis, not elsewhere classified: Secondary | ICD-10-CM | POA: Diagnosis not present

## 2018-05-24 DIAGNOSIS — J45909 Unspecified asthma, uncomplicated: Secondary | ICD-10-CM | POA: Diagnosis not present

## 2018-05-24 DIAGNOSIS — E119 Type 2 diabetes mellitus without complications: Secondary | ICD-10-CM | POA: Diagnosis not present

## 2018-05-24 DIAGNOSIS — I10 Essential (primary) hypertension: Secondary | ICD-10-CM | POA: Diagnosis not present

## 2018-05-24 DIAGNOSIS — I2510 Atherosclerotic heart disease of native coronary artery without angina pectoris: Secondary | ICD-10-CM | POA: Diagnosis not present

## 2018-06-22 DIAGNOSIS — J45909 Unspecified asthma, uncomplicated: Secondary | ICD-10-CM | POA: Diagnosis not present

## 2018-06-22 DIAGNOSIS — E119 Type 2 diabetes mellitus without complications: Secondary | ICD-10-CM | POA: Diagnosis not present

## 2018-06-22 DIAGNOSIS — I2510 Atherosclerotic heart disease of native coronary artery without angina pectoris: Secondary | ICD-10-CM | POA: Diagnosis not present

## 2018-06-22 DIAGNOSIS — I10 Essential (primary) hypertension: Secondary | ICD-10-CM | POA: Diagnosis not present

## 2018-07-11 DIAGNOSIS — J45909 Unspecified asthma, uncomplicated: Secondary | ICD-10-CM | POA: Diagnosis not present

## 2018-07-11 DIAGNOSIS — I10 Essential (primary) hypertension: Secondary | ICD-10-CM | POA: Diagnosis not present

## 2018-07-11 DIAGNOSIS — I2510 Atherosclerotic heart disease of native coronary artery without angina pectoris: Secondary | ICD-10-CM | POA: Diagnosis not present

## 2018-07-11 DIAGNOSIS — E119 Type 2 diabetes mellitus without complications: Secondary | ICD-10-CM | POA: Diagnosis not present

## 2018-07-24 DIAGNOSIS — G20 Parkinson's disease: Secondary | ICD-10-CM | POA: Diagnosis not present

## 2018-07-24 DIAGNOSIS — H6121 Impacted cerumen, right ear: Secondary | ICD-10-CM | POA: Diagnosis not present

## 2018-07-24 DIAGNOSIS — I639 Cerebral infarction, unspecified: Secondary | ICD-10-CM | POA: Diagnosis not present

## 2018-07-24 DIAGNOSIS — Z299 Encounter for prophylactic measures, unspecified: Secondary | ICD-10-CM | POA: Diagnosis not present

## 2018-07-24 DIAGNOSIS — I10 Essential (primary) hypertension: Secondary | ICD-10-CM | POA: Diagnosis not present

## 2018-07-24 DIAGNOSIS — Z6822 Body mass index (BMI) 22.0-22.9, adult: Secondary | ICD-10-CM | POA: Diagnosis not present

## 2018-07-24 DIAGNOSIS — E1165 Type 2 diabetes mellitus with hyperglycemia: Secondary | ICD-10-CM | POA: Diagnosis not present

## 2018-07-28 IMAGING — CT CT HEAD CODE STROKE
3 series · 15 of 47 positions shown, 18 images · non-contrast
Comparison: Cervical spine MRI 01/05/2017.

CLINICAL DATA: Code stroke. [AGE] male with right arm and
right side body weakness. Unable to walk this morning.

EXAM:
CT HEAD WITHOUT CONTRAST
TECHNIQUE: Contiguous axial images were obtained from the base of the skull
through the vertex without intravenous contrast.

[Series 2: head code stroke wo · axial · 0.47mm/px · z∈[+84,+214]mm · 9 of 32 slices shown, 12 images]
[im 3/32  brain]
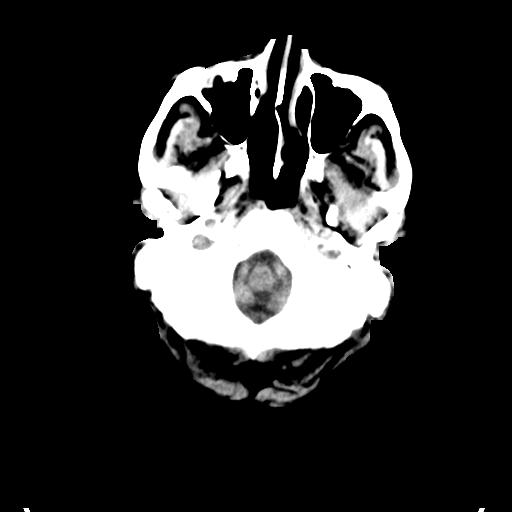
[im 3/32  bone]
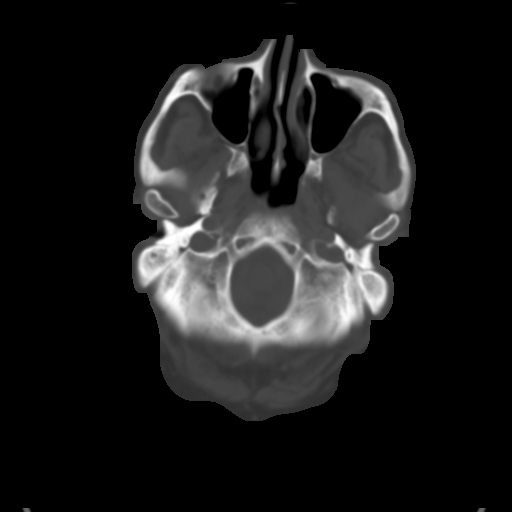
[im 6/32  brain]
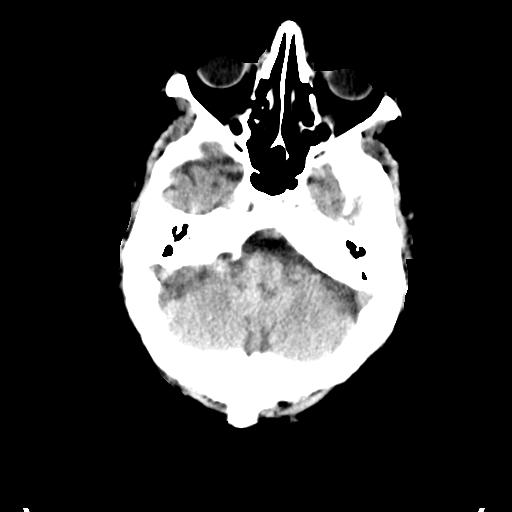
[im 9/32  brain]
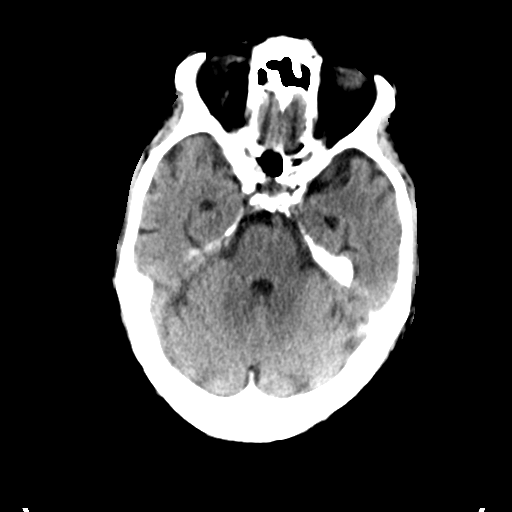
[im 12/32  brain]
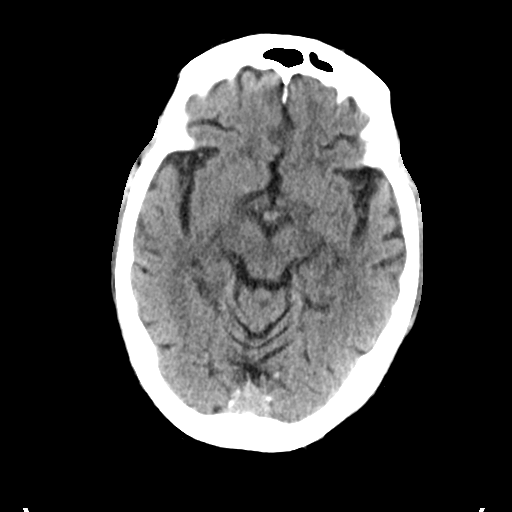
[im 17/32  brain]
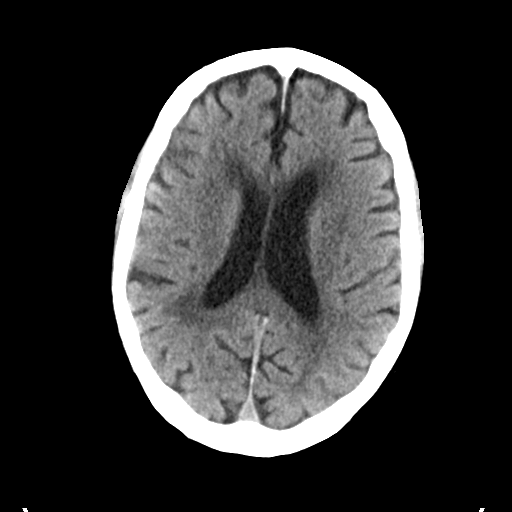
[im 17/32  bone]
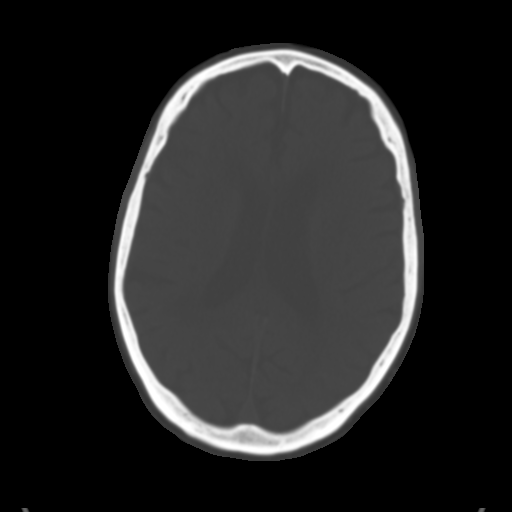
[im 20/32  brain]
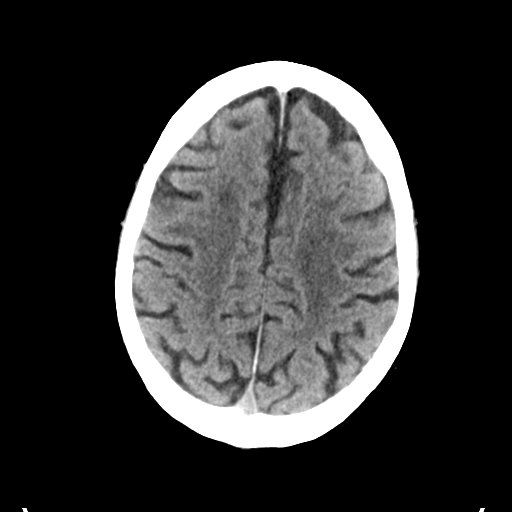
[im 23/32  brain]
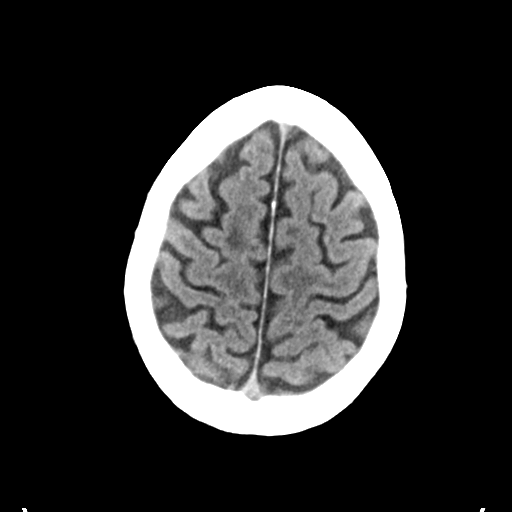
[im 26/32  brain]
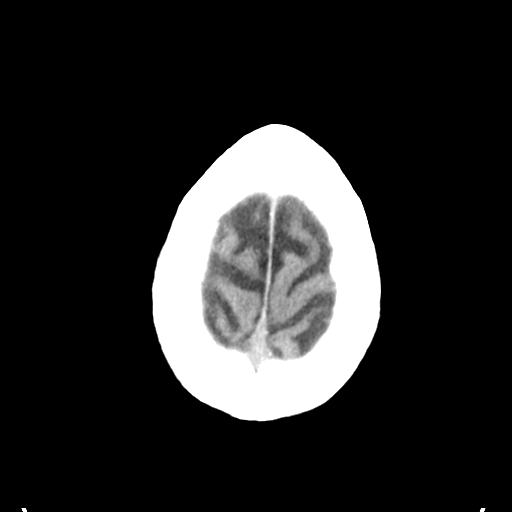
[im 29/32  brain]
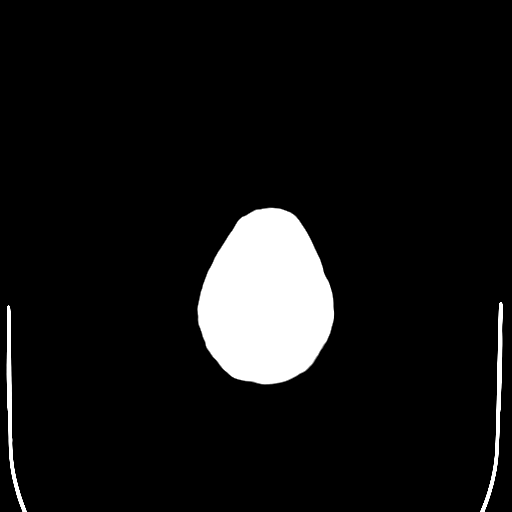
[im 29/32  bone]
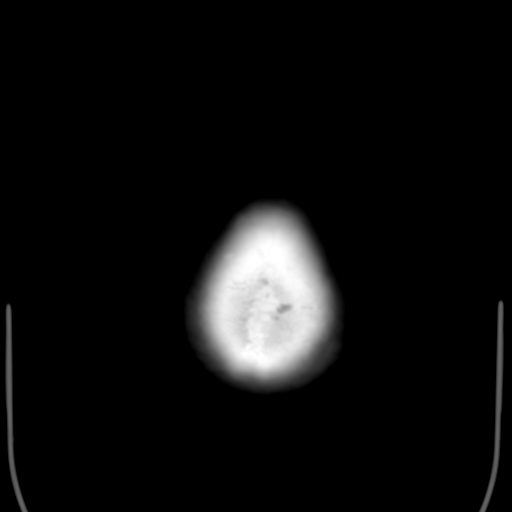

[Series 4: coronal soft tissue · coronal · 0.33mm/px · 3 of 69 slices shown]
[im 23/69  brain]
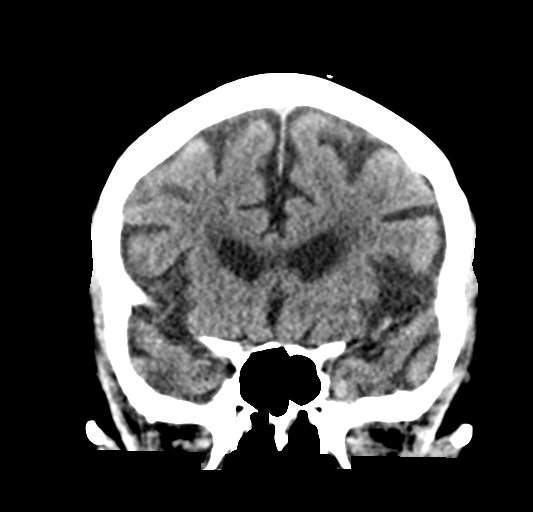
[im 31/69  brain]
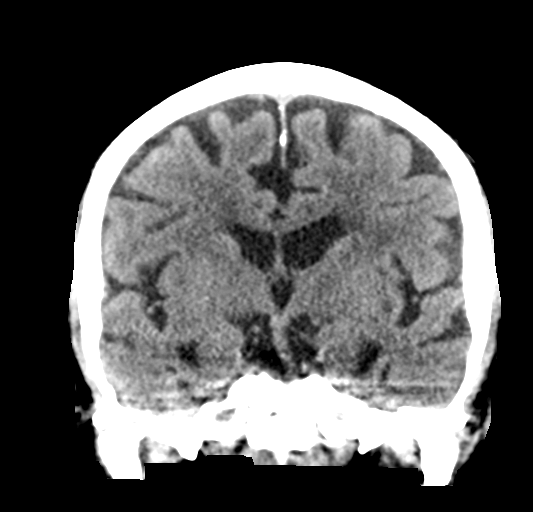
[im 38/69  brain]
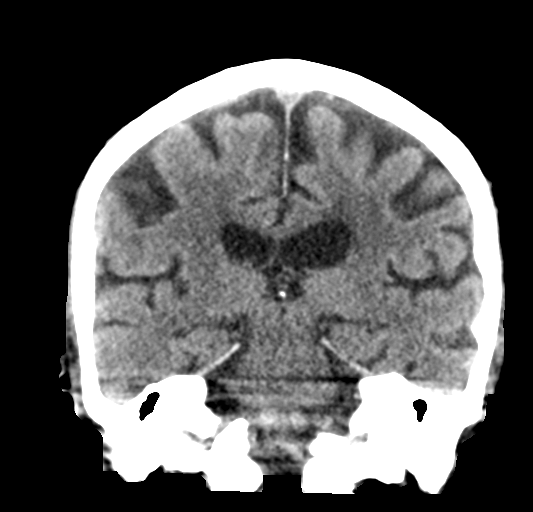

[Series 5: sagittal soft tissue · sagittal · 0.32mm/px · 3 of 56 slices shown]
[im 19/56  brain]
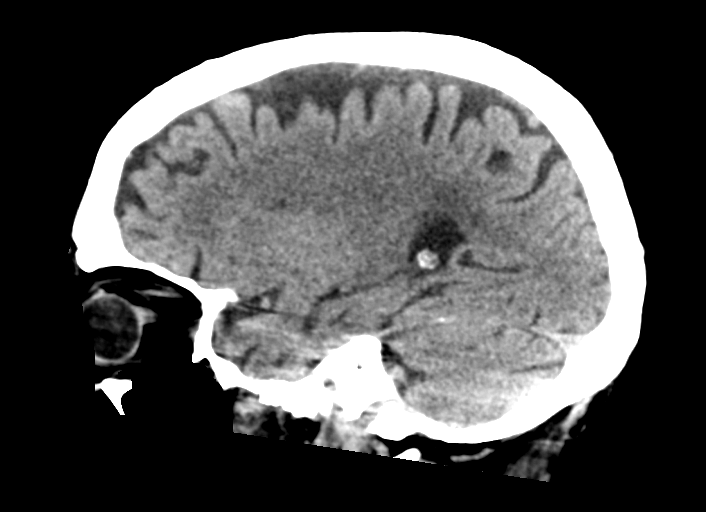
[im 28/56  brain]
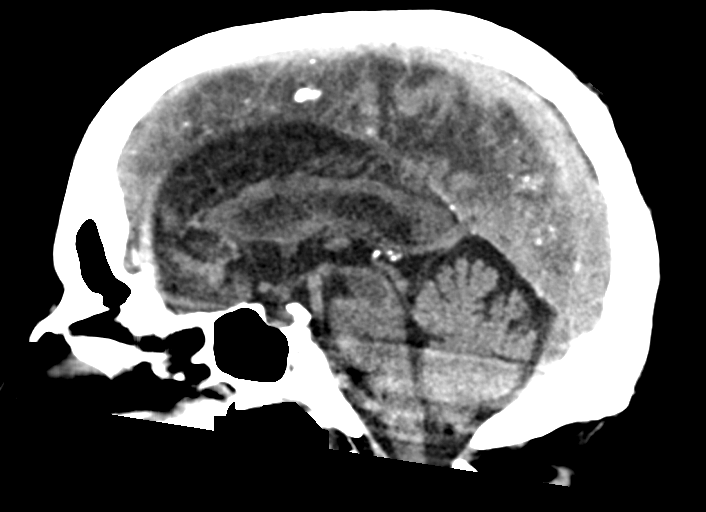
[im 37/56  brain]
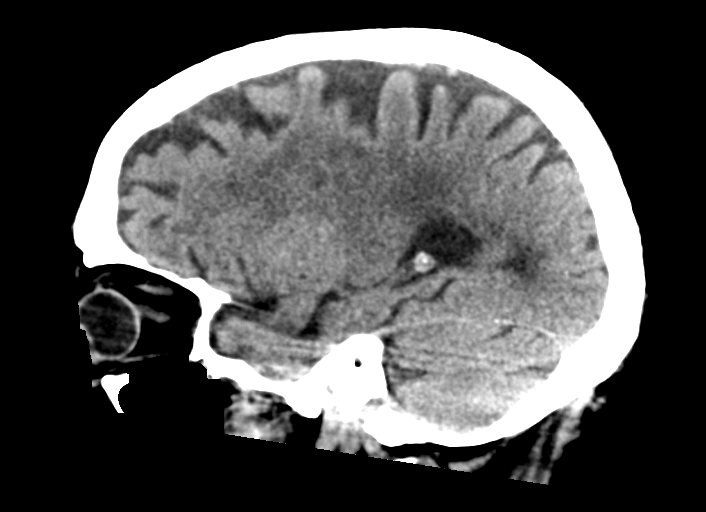

[15 of 47 positions shown; findings below may reference images not displayed]

FINDINGS: Brain: Patchy bilateral cerebral white matter hypodensity. Cerebral
volume is within normal limits for age. No midline shift,
ventriculomegaly, mass effect, evidence of mass lesion, intracranial
hemorrhage or evidence of cortically based acute infarction. No
cortical encephalomalacia identified.

Vascular: Calcified atherosclerosis at the skull base. No suspicious
intracranial vascular hyperdensity.

Skull: Negative.

Sinuses/Orbits: Clear.

Other: Postoperative changes to both globes. Otherwise negative
Visualized orbits and scalp soft tissues.

ASPECTS (Alberta Stroke Program Early CT Score)

- Ganglionic level infarction (caudate, lentiform nuclei, internal
capsule, insula, M1-M3 cortex): 7

- Supraganglionic infarction (M4-M6 cortex): 3

Total score (0-10 with 10 being normal): 10
IMPRESSION: 1. No acute cortically based infarct or acute intracranial
hemorrhage identified. ASPECTS is 10.
2. Mild to moderate for age cerebral white matter signal changes,
most commonly due to chronic small vessel disease.
3. Study discussed by telephone with Dr. DEOMZ NOLASCO DA SILVA on 02/21/2017 at

## 2018-08-02 DIAGNOSIS — I10 Essential (primary) hypertension: Secondary | ICD-10-CM | POA: Diagnosis not present

## 2018-08-02 DIAGNOSIS — E119 Type 2 diabetes mellitus without complications: Secondary | ICD-10-CM | POA: Diagnosis not present

## 2018-08-02 DIAGNOSIS — J45909 Unspecified asthma, uncomplicated: Secondary | ICD-10-CM | POA: Diagnosis not present

## 2018-08-02 DIAGNOSIS — I2510 Atherosclerotic heart disease of native coronary artery without angina pectoris: Secondary | ICD-10-CM | POA: Diagnosis not present

## 2018-08-13 DIAGNOSIS — Z299 Encounter for prophylactic measures, unspecified: Secondary | ICD-10-CM | POA: Diagnosis not present

## 2018-08-13 DIAGNOSIS — E1165 Type 2 diabetes mellitus with hyperglycemia: Secondary | ICD-10-CM | POA: Diagnosis not present

## 2018-08-13 DIAGNOSIS — G20 Parkinson's disease: Secondary | ICD-10-CM | POA: Diagnosis not present

## 2018-08-13 DIAGNOSIS — I10 Essential (primary) hypertension: Secondary | ICD-10-CM | POA: Diagnosis not present

## 2018-08-13 DIAGNOSIS — Z6822 Body mass index (BMI) 22.0-22.9, adult: Secondary | ICD-10-CM | POA: Diagnosis not present

## 2018-09-24 DIAGNOSIS — R3911 Hesitancy of micturition: Secondary | ICD-10-CM | POA: Diagnosis not present

## 2018-09-24 DIAGNOSIS — Z299 Encounter for prophylactic measures, unspecified: Secondary | ICD-10-CM | POA: Diagnosis not present

## 2018-09-24 DIAGNOSIS — I10 Essential (primary) hypertension: Secondary | ICD-10-CM | POA: Diagnosis not present

## 2018-09-24 DIAGNOSIS — N400 Enlarged prostate without lower urinary tract symptoms: Secondary | ICD-10-CM | POA: Diagnosis not present

## 2018-09-24 DIAGNOSIS — Z6822 Body mass index (BMI) 22.0-22.9, adult: Secondary | ICD-10-CM | POA: Diagnosis not present

## 2018-10-10 DIAGNOSIS — Z1283 Encounter for screening for malignant neoplasm of skin: Secondary | ICD-10-CM | POA: Diagnosis not present

## 2018-10-10 DIAGNOSIS — D040 Carcinoma in situ of skin of lip: Secondary | ICD-10-CM | POA: Diagnosis not present

## 2018-10-10 DIAGNOSIS — C4401 Basal cell carcinoma of skin of lip: Secondary | ICD-10-CM | POA: Diagnosis not present

## 2018-10-10 DIAGNOSIS — Z85820 Personal history of malignant melanoma of skin: Secondary | ICD-10-CM | POA: Diagnosis not present

## 2018-10-10 DIAGNOSIS — X32XXXD Exposure to sunlight, subsequent encounter: Secondary | ICD-10-CM | POA: Diagnosis not present

## 2018-10-10 DIAGNOSIS — L570 Actinic keratosis: Secondary | ICD-10-CM | POA: Diagnosis not present

## 2018-10-10 DIAGNOSIS — Z08 Encounter for follow-up examination after completed treatment for malignant neoplasm: Secondary | ICD-10-CM | POA: Diagnosis not present

## 2018-10-15 DIAGNOSIS — J45909 Unspecified asthma, uncomplicated: Secondary | ICD-10-CM | POA: Diagnosis not present

## 2018-10-15 DIAGNOSIS — E119 Type 2 diabetes mellitus without complications: Secondary | ICD-10-CM | POA: Diagnosis not present

## 2018-10-15 DIAGNOSIS — I10 Essential (primary) hypertension: Secondary | ICD-10-CM | POA: Diagnosis not present

## 2018-10-15 DIAGNOSIS — I2510 Atherosclerotic heart disease of native coronary artery without angina pectoris: Secondary | ICD-10-CM | POA: Diagnosis not present

## 2018-10-25 ENCOUNTER — Other Ambulatory Visit: Payer: Self-pay

## 2018-10-25 ENCOUNTER — Ambulatory Visit: Payer: Medicare Other | Admitting: Adult Health

## 2018-10-25 ENCOUNTER — Encounter: Payer: Self-pay | Admitting: Family Medicine

## 2018-10-25 ENCOUNTER — Encounter

## 2018-10-25 ENCOUNTER — Ambulatory Visit (INDEPENDENT_AMBULATORY_CARE_PROVIDER_SITE_OTHER): Payer: Medicare Other | Admitting: Family Medicine

## 2018-10-25 VITALS — BP 156/76 | HR 62 | Temp 97.7°F | Ht 67.0 in | Wt 140.6 lb

## 2018-10-25 DIAGNOSIS — Z8673 Personal history of transient ischemic attack (TIA), and cerebral infarction without residual deficits: Secondary | ICD-10-CM | POA: Diagnosis not present

## 2018-10-25 DIAGNOSIS — G20 Parkinson's disease: Secondary | ICD-10-CM | POA: Diagnosis not present

## 2018-10-25 NOTE — Progress Notes (Addendum)
PATIENT: Victor Bell DOB: 02/03/1922  REASON FOR VISIT: follow up HISTORY FROM: patient  Chief Complaint  Patient presents with   Follow-up    Yearly f/u. Daughter present. Rm 2. Patient mentioned that his some numbness to both legs more so to the right leg. He stated that when he walks around it goes away some time     HISTORY OF PRESENT ILLNESS: Today 10/30/18 Victor Bell is a 85 y.o. male here today for follow up for PD.  He feels that overall he is doing well.  He presents today with his daughter who agrees.  He continues Sinemet 1 tablet 4 times daily as well as Sinemet CR 1 tablet 4 times daily.  He does feel that this helps somewhat with his tremor.  He denies changes in gait.  He has had some increased numbness of bilateral lower extremities, worse in the right leg status post stroke.  Numbness improves when he gets up and walks around.  He has had a couple of falls.  No injuries.  He declines offer for physical therapy at this time.  He is following with Dr. Woody Seller closely and was instructed to try compression stockings at home.  He is not interested in this at this time.  Remeron continues to help with sleep.  He is eating and drinking well.  No trouble swallowing.  Memory seems stable.  He continues Plavix and Lipitor for stroke prevention.  Blood pressure is usually normal at home.  Metformin dose was reduced recently due to cold A1c.  He has no concerns today.  HISTORY: (copied from Dr Guadelupe Sabin note on 08/22/2017)  Victor Bell is a 85 year old left-handed gentleman with an underlying medical history of diabetes, hyperlipidemia, prostate hypertrophy, hypertension, and chronic lung disease who presents for follow up consultation of his right-sided parkinsonism of about 7 years duration and also after a recent hospitalization for stroke. The patient is accompanied by his daughter, [name], again today. I last saw him on I saw him on 04/19/2017, at which time he reported recuperating  slowly from his recent stroke. He was in the hospital for a acute left sided ischemic stroke deemed secondary to left carotid artery stenosis. He had developed side effects on gabapentin which was restarted by his neurosurgeon but stopped again. He had developed right shoulder pain and received an injection in it, had to take Vicodin half a pill every 4 hours.  Today, 08/22/2017 (all dictated new, as well as above notes, some dictation done in note pad or Word, outside of chart, may appear as copied): He reports feeling stable. However, he had a recent fall. He scraped his LUE and LLE. Happened at home about 3-4 weeks ago, fell on his left side and scraped his arm and knee but also hit his mouth. He needed a dentist appointment.as I understand, he was in his garage and was trying to get away from bees or hornets. He had TIA type Sx with slurring of speech in 04/2017, went to [facility], had tele-neurology consult, stayed overnight. CT negative for acute findings, this was on 05/15/17. Heart murmur was checked out, had echo with Dr. Woody Seller and had FU with Dr. Oran Rein, routinely. Takes his C/L IR and CR two to 3 times a day. BT is around 10 ish. He no longer is on the Vicodin, no more benazepril. Less sleepy during the day. On Remeron for appetite, has since then gained a few lb.    The patient's allergies, current  medications, family history, past medical history, past social history, past surgical history and problem list were reviewed and updated as appropriate.   Previously (copied from previous notes for reference):   09/19/2016, at which time he reported 3 recent falls, also lightheadedness. He was also overdoing things including painting the outside of the house and redoing the driveway. He was not always titrating well enough. He had one episode of transient one-sided weakness. We talked about gait safety and fall risk quite a bit.  I saw him on 03/22/2016, at which time he reported  feeling fairly stable. He did have one fall a few months back, while [place] the beach. Thankfully, he did not hurt himself, fell in the bathroom and pulled the shower curtain down. He was having some back pain, was avoiding tramadol. He had some trouble falling asleep with a long-standing history of difficulty with sleep maintenance. He had tried sleeping pills in the past. He was on Sinemet immediate release and CR 1 pill 3 times a day. I suggested we continue with his regimen. His daughter emailed in the interim in May 2018 requesting whether we could increase his Sinemet IR and CR to 1 pill 4 times a day each. I advised her that we could try this.  I saw him on 11/17/2015, at which time he reported doing okay, he had some back pain, for which he had epidural steroid injections. He reported leg pains and history of restless leg syndrome. He had flareup of allergy symptoms, had gone through allergy shots decades ago and was on over-the-counter allergy medication. I asked him to continue with Sinemet and Sinemet CR. He was advised to avoid any decongestants over-the-counter.I suggested we start him on a trial of low-dose gabapentin for his restless leg syndrome. He was advised to avoid any benzodiazepines. His daughter emailed in November 2017 with concerns that he still had restless leg symptoms. I suggested we gradually increased his gabapentin to 200 mg, then 300 mg daily.  Of note, they had to cancel an appointment for 10/06/2015. I saw him on 06/01/2015, at which time he reported not feeling much in the way of difference on the long acting vs. the IR C/L. He would not always remember to take the 3 pills a day, sometimes in midday he would take the IR. He had an increase in the metformin. He lost about 10 lb in 3 months. He felt that his tremor was getting worse. He had occasional lightheadedness upon standing quickly. He cannot exercise very much because of lumbar spinal stenosis and lower back pain,  sometimes radiating to the left. He had no recent falls. I suggested he take 1 pill of immediate release Sinemet along with one CR 3 times a day for a total of 6 pills a day. I first met him on 03/03/2015 at the request of his primary care physician, at which time he reported a prior diagnosis of Parkinson's disease and he is to follow with a neurologist moved away. He was on Sinemet. He was not always remembering his medication. We switched his Sinemet to Sinemet CR 25-100 milligrams strength one tablet 3 times a day about 4 hourly dosing.  03/03/2015: He was previously diagnosed with Parkinson's disease. He has seen Dr. Brandon Melnick. Symptoms date back to 6 years ago with mild progression noted. He started noticing a right hand tremor at rest about 6 years ago. Symptoms have progressed very mildly over the course of time. He has been on C/L for about 4  years.   He has been on Sinemet. He is currently on 25-100 milligrams strength one pill 3 to 4 times a day. He has mild constipation which is manageable. He does not like to drink water and drinks perhaps one or 2 cups a day. He likes to drink coffee in the morning. He lives with his 23 year old wife, they have 2 grown children, both in the area. Daughter checks on them every day. He has a son who lives about 10 miles away. He worked for [employer]. Thankfully he has not fallen recently. He has a cane and a walker but does not use his walker very much. Sometimes he uses a cane inside the house. He feels that the Sinemet has been helpful. He does not always remember the midday dose and usually averages 2-3 pills a day. It is written for 4 times a day but he does not typically take it 4 times a day. He feels that it lasts about 3 maybe 4 hours in between. He does not report any significant side effects. In particular, he denies any nausea, headache, hallucinations, he has had some blood pressure decrease with time. He has problems sleeping at night at times  and is somewhat sleepy during the day and dozes off involuntarily. He has been driving but has limited his driving to daytime driving and local roads only.  He has not noticed any involuntary movements such as we would call dyskinesias. His neurologist moved away. I reviewed your office note from 01/22/2015, which you kindly included.   REVIEW OF SYSTEMS: Out of a complete 14 system review of symptoms, the patient complains only of the following symptoms, fatigue, hearing loss, runny nose, eye discharge, cold intolerance, constipation, diarrhea, restless leg, frequent waking, daytime sleepiness, difficulty urinating, joint pain, back pain, walking difficulty, neck stiffness, dizziness, numbness, speech difficulty, weakness, tremors, facial drooping and all other reviewed systems are negative.  ALLERGIES: Allergies  Allergen Reactions   Niacin And Related Other (See Comments)    Burning     HOME MEDICATIONS: Outpatient Medications Prior to Visit  Medication Sig Dispense Refill   atorvastatin (LIPITOR) 80 MG tablet Take 80 mg by mouth daily.       carbidopa-levodopa (SINEMET IR) 25-100 MG tablet Take 1 tablet by mouth 4 (four) times daily. Pt takes carbidopa-levodopa extended release and other at same time.      Carbidopa-Levodopa ER (SINEMET CR) 25-100 MG tablet controlled release Take 1 tablet by mouth 4 (four) times daily.      cetirizine (ZYRTEC) 10 MG tablet Take 10 mg by mouth daily.     clopidogrel (PLAVIX) 75 MG tablet Take 1 tablet (75 mg total) by mouth daily. 30 tablet 0   dutasteride (AVODART) 0.5 MG capsule Take 0.5 mg by mouth daily.       finasteride (PROSCAR) 5 MG tablet Take 5 mg by mouth daily.      fluticasone (FLONASE) 50 MCG/ACT nasal spray Place 1 spray into both nostrils daily.     metFORMIN (GLUCOPHAGE) 500 MG tablet Take 500 mg by mouth daily.      metoprolol (TOPROL-XL) 50 MG 24 hr tablet Take 25 mg by mouth daily.      mirtazapine (REMERON) 7.5 MG  tablet Take 7.5 mg by mouth every evening.     nabumetone (RELAFEN) 500 MG tablet Take 500 mg by mouth 2 (two) times daily.       omeprazole (PRILOSEC) 40 MG capsule Take 40 mg by mouth daily.  polyethylene glycol powder (GLYCOLAX/MIRALAX) powder Take 1 Container by mouth daily.      Tamsulosin HCl (FLOMAX) 0.4 MG CAPS Take 0.4 mg by mouth daily.       traMADol (ULTRAM) 50 MG tablet Take 50 mg by mouth every 6 (six) hours as needed.     aspirin 325 MG tablet Take 1 tablet (325 mg total) by mouth daily. (Patient not taking: Reported on 10/25/2018) 30 tablet 0   ipratropium (ATROVENT) 0.06 % nasal spray Place 1 spray into the nose daily.      No facility-administered medications prior to visit.     PAST MEDICAL HISTORY: Past Medical History:  Diagnosis Date   Arthritis    Asthma    CAD (coronary artery disease)    Cancer (HCC)    skin    Carotid artery occlusion    CHF (congestive heart failure) (HCC)    Colon polyp    COPD (chronic obstructive pulmonary disease) (HCC)    Diabetes mellitus    GERD (gastroesophageal reflux disease)    Heart disease    Hyperlipidemia    Hypertension    Myocardial infarction ([place])    1998    PAST SURGICAL HISTORY: Past Surgical History:  Procedure Laterality Date   CATARACT EXTRACTION     CHOLECYSTECTOMY     Gall bladder   CORONARY ARTERY BYPASS GRAFT  10/03/96   x7   TONSILLECTOMY      FAMILY HISTORY: Family History  Problem Relation Age of Onset   Heart disease Mother    Aneurysm Sister     SOCIAL HISTORY: Social History   Socioeconomic History   Marital status: Married    Spouse name: Not on file   Number of children: 1   Years of education: 11   Highest education level: Not on file  Occupational History   Occupation: Retired  Scientist, product/process development strain: Not on file   Food insecurity    Worry: Not on file    Inability: Not on Lexicographer needs    Medical:  Not on file    Non-medical: Not on file  Tobacco Use   Smoking status: Never Smoker   Smokeless tobacco: Never Used  Substance and Sexual Activity   Alcohol use: No    Alcohol/week: 0.0 standard drinks   Drug use: No   Sexual activity: Not on file  Lifestyle   Physical activity    Days per week: Not on file    Minutes per session: Not on file   Stress: Not on file  Relationships   Social connections    Talks on phone: Not on file    Gets together: Not on file    Attends religious service: Not on file    Active member of club or organization: Not on file    Attends meetings of clubs or organizations: Not on file    Relationship status: Not on file   Intimate partner violence    Fear of current or ex partner: Not on file    Emotionally abused: Not on file    Physically abused: Not on file    Forced sexual activity: Not on file  Other Topics Concern   Not on file  Social History Narrative   Drinks coffee in the morning       PHYSICAL EXAM  Vitals:   10/25/18 1413  BP: (!) 156/76  Pulse: 62  Temp: 97.7 F (36.5 C)  TempSrc: Oral  Weight:  140 lb 9.6 oz (63.8 kg)  Height: 5' 7" (1.702 m)   Body mass index is 22.02 kg/m.  Generalized: Well developed, in no acute distress  Cardiology: normal rate and rhythm, no murmur noted Neurological examination  Mentation: Alert oriented to time, place, history taking. Follows all commands speech and language fluent Cranial nerve II-XII: Pupils were equal round reactive to light. Extraocular movements were full, visual field were full on confrontational test. Facial sensation and strength were normal. Uvula tongue midline. Head turning and shoulder shrug  were normal and symmetric. Motor: The motor testing reveals 4 over 5 strength of all 4 extremities. Good symmetric motor tone is noted throughout.  Sensory: Sensory testing is intact to soft touch on all 4 extremities. No evidence of extinction is noted.  Coordination:  Cerebellar testing reveals slow and mildly ataxic finger-nose-finger and heel-to-shin bilaterally.  Gait and station: Gait is short but stable. Tandem gait not attempted. Romberg is negative. No drift is seen.  Reflexes: Deep tendon reflexes are symmetric and normal bilaterally.   DIAGNOSTIC DATA (LABS, IMAGING, TESTING) - I reviewed patient records, labs, notes, testing and imaging myself where available.  No flowsheet data found.   Lab Results  Component Value Date   WBC 8.1 02/21/2017   HGB 11.0 (L) 02/21/2017   HCT 33.1 (L) 02/21/2017   MCV 90.4 02/21/2017   PLT 146 (L) 02/21/2017      Component Value Date/Time   NA 139 02/21/2017 1124   K 4.5 02/21/2017 1124   CL 105 02/21/2017 1124   CO2 21 (L) 02/21/2017 1117   GLUCOSE 135 (H) 02/21/2017 1124   BUN 24 (H) 02/21/2017 1124   CREATININE 1.30 (H) 02/21/2017 2043   CALCIUM 9.3 02/21/2017 1117   PROT 6.8 02/21/2017 1117   ALBUMIN 3.9 02/21/2017 1117   AST 16 02/21/2017 1117   ALT 8 (L) 02/21/2017 1117   ALKPHOS 76 02/21/2017 1117   BILITOT 1.0 02/21/2017 1117   GFRNONAA 46 (L) 02/21/2017 2043   GFRAA 53 (L) 02/21/2017 2043   Lab Results  Component Value Date   CHOL 139 02/22/2017   HDL 36 (L) 02/22/2017   LDLCALC 71 02/22/2017   TRIG 162 (H) 02/22/2017   CHOLHDL 3.9 02/22/2017   Lab Results  Component Value Date   HGBA1C 6.5 (H) 02/22/2017   No results found for: VITAMINB12 No results found for: TSH   ASSESSMENT AND PLAN 85 y.o. year old male  has a past medical history of Arthritis, Asthma, CAD (coronary artery disease), Cancer ([place]), Carotid artery occlusion, CHF (congestive heart failure) ([place]), Colon polyp, COPD (chronic obstructive pulmonary disease) ([place]), Diabetes mellitus, GERD (gastroesophageal reflux disease), Heart disease, Hyperlipidemia, Hypertension, and Myocardial infarction ([place]). here with     ICD-10-CM   1. Primary Parkinsonism ([place])  G20   2. History of CVA (cerebrovascular accident)   Z2.73     Overall Mr. Halfmann is doing well.  He continues Sinemet ER and IR as prescribed.  Increased numbness since stroke has persisted.  He is working very closely with primary care for stroke prevention and management of symptoms.  He will continue current treatment plan.  PCP is prescribing Sinemet per patient and family request.  He will continue to follow-up with [place] yearly, sooner if needed.  He and his daughter both verbalized understanding and agreement with this plan.   No orders of the defined types were placed in this encounter.    No orders of the defined types were placed  in this encounter.     I spent 15 minutes with the patient. 50% of this time was spent counseling and educating patient on plan of care and medications.    Debbora Presto, FNP-C 10/30/2018, 4:24 PM [facility] [address] [place], [place] [address] [phone]  I reviewed the above note and documentation by the Nurse Practitioner and agree with the history, exam, assessment and plan as outlined above. I was available for consultation. Star Age, MD, PhD [facility])

## 2018-10-25 NOTE — Patient Instructions (Signed)
Continue current treatment plan  Follow up closely with Dr Woody Seller   Follow up with me in 1 year, sooner if needed  Parkinson's Disease Parkinson's disease causes problems with movements. It is a long-term condition. It gets worse over time (is progressive). It affects each person in different ways. It makes it harder for you to:  Control how your body moves.  Move your body normally. The condition can range from mild to very bad (advanced). What are the causes? This condition results from a loss of brain cells called neurons. These brain cells make a chemical called dopamine, which is needed to control body movement. As the condition gets worse, the brain cells make less dopamine. This makes it hard to move or control your movements. The exact cause of this condition is not known. What increases the risk?  Being male.  Being age 31 or older.  Having family members who had Parkinson's disease.  Having had an injury to the brain.  Being very sad (depressed).  Being around things that are harmful or poisonous. What are the signs or symptoms? Symptoms of this condition can vary. The main symptoms have to do with movement. These include:  A tremor or shaking while you are resting that you cannot control.  Stiffness in your neck, arms, and legs.  Slowing of movement. This may include: ? Losing expressions of the face. ? Having trouble making small movements that are needed to button your clothing or brush your teeth.  Walking in a way that is not normal. You may walk with short, shuffling steps.  Loss of balance when standing. You may sway, fall backward, or have trouble making turns. Other symptoms include:  Being very sad, worried, or confused.  Seeing or hearing things that are not real.  Losing thinking abilities (dementia).  Trouble speaking or swallowing.  Having a hard time pooping (constipation).  Needing to pee right away, peeing often, or not being able to  control when you pee or poop.  Sleep problems. How is this treated? There is no cure. The goal of treatment is to manage your symptoms. Treatment may include:  Medicines.  Therapy to help with talking or movement.  Surgery to reduce shaking and other movements that you cannot control. Follow these instructions at home: Medicines  Take over-the-counter and prescription medicines only as told by your doctor.  Avoid taking pain or sleeping medicines. Eating and drinking  Follow instructions from your doctor about what you cannot eat or drink.  Do not drink alcohol. Activity  Talk with your doctor about if it is safe for you to drive.  Do exercises as told by your doctor. Lifestyle      Put in grab bars and railings in your home. These help to prevent falls.  Do not use any products that contain nicotine or tobacco, such as cigarettes, e-cigarettes, and chewing tobacco. If you need help quitting, ask your doctor.  Join a support group. General instructions  Talk with your doctor about what you need help with and what your safety needs are.  Keep all follow-up visits as told by your doctor, including any therapy visits to help with talking or moving. This is important. Contact a doctor if:  Medicines do not help your symptoms.  You feel off-balance.  You fall at home.  You need more help at home.  You have trouble swallowing.  You have a very hard time pooping.  You have a lot of side effects from your  medicines.  You feel very sad, worried, or confused. Get help right away if:  You were hurt in a fall.  You see or hear things that are not real.  You cannot swallow without choking.  You have chest pain or trouble breathing.  You do not feel safe at home.  You have thoughts about hurting yourself or others. If you ever feel like you may hurt yourself or others, or have thoughts about taking your own life, get help right away. You can go to your  nearest emergency department or call:  Your local emergency services (911 in the [place]  A suicide crisis helpline, such as the [place] at (603) 557-2412. This is open 24 hours a day. Summary  This condition causes problems with movements.  It is a long-term condition. It gets worse over time.  There is no cure. Treatment focuses on managing your symptoms.  Talk with your doctor about what you need help with and what your safety needs are.  Keep all follow-up visits as told by your doctor. This is important. This information is not intended to replace advice given to you by your health care provider. Make sure you discuss any questions you have with your health care provider. Document Released: 04/04/2011 Document Revised: 03/29/2018 Document Reviewed: 03/29/2018 Elsevier Patient Education  2020 [employer].

## 2018-10-29 DIAGNOSIS — Z23 Encounter for immunization: Secondary | ICD-10-CM | POA: Diagnosis not present

## 2018-10-30 ENCOUNTER — Encounter: Payer: Self-pay | Admitting: Family Medicine

## 2018-11-08 DIAGNOSIS — X32XXXD Exposure to sunlight, subsequent encounter: Secondary | ICD-10-CM | POA: Diagnosis not present

## 2018-11-08 DIAGNOSIS — Z08 Encounter for follow-up examination after completed treatment for malignant neoplasm: Secondary | ICD-10-CM | POA: Diagnosis not present

## 2018-11-08 DIAGNOSIS — L570 Actinic keratosis: Secondary | ICD-10-CM | POA: Diagnosis not present

## 2018-11-08 DIAGNOSIS — Z85828 Personal history of other malignant neoplasm of skin: Secondary | ICD-10-CM | POA: Diagnosis not present

## 2018-11-16 DIAGNOSIS — J45909 Unspecified asthma, uncomplicated: Secondary | ICD-10-CM | POA: Diagnosis not present

## 2018-11-16 DIAGNOSIS — E119 Type 2 diabetes mellitus without complications: Secondary | ICD-10-CM | POA: Diagnosis not present

## 2018-11-16 DIAGNOSIS — I10 Essential (primary) hypertension: Secondary | ICD-10-CM | POA: Diagnosis not present

## 2018-11-16 DIAGNOSIS — I2510 Atherosclerotic heart disease of native coronary artery without angina pectoris: Secondary | ICD-10-CM | POA: Diagnosis not present

## 2018-11-22 DIAGNOSIS — Z299 Encounter for prophylactic measures, unspecified: Secondary | ICD-10-CM | POA: Diagnosis not present

## 2018-11-22 DIAGNOSIS — E1165 Type 2 diabetes mellitus with hyperglycemia: Secondary | ICD-10-CM | POA: Diagnosis not present

## 2018-11-22 DIAGNOSIS — G20 Parkinson's disease: Secondary | ICD-10-CM | POA: Diagnosis not present

## 2018-11-22 DIAGNOSIS — Z6822 Body mass index (BMI) 22.0-22.9, adult: Secondary | ICD-10-CM | POA: Diagnosis not present

## 2018-11-22 DIAGNOSIS — I10 Essential (primary) hypertension: Secondary | ICD-10-CM | POA: Diagnosis not present

## 2018-11-29 DIAGNOSIS — Z6822 Body mass index (BMI) 22.0-22.9, adult: Secondary | ICD-10-CM | POA: Diagnosis not present

## 2018-11-29 DIAGNOSIS — M65342 Trigger finger, left ring finger: Secondary | ICD-10-CM | POA: Diagnosis not present

## 2018-11-29 DIAGNOSIS — I10 Essential (primary) hypertension: Secondary | ICD-10-CM | POA: Diagnosis not present

## 2018-11-29 DIAGNOSIS — Z713 Dietary counseling and surveillance: Secondary | ICD-10-CM | POA: Diagnosis not present

## 2018-11-29 DIAGNOSIS — Z299 Encounter for prophylactic measures, unspecified: Secondary | ICD-10-CM | POA: Diagnosis not present

## 2018-12-11 DIAGNOSIS — Z125 Encounter for screening for malignant neoplasm of prostate: Secondary | ICD-10-CM | POA: Diagnosis not present

## 2018-12-11 DIAGNOSIS — R5383 Other fatigue: Secondary | ICD-10-CM | POA: Diagnosis not present

## 2018-12-11 DIAGNOSIS — Z7189 Other specified counseling: Secondary | ICD-10-CM | POA: Diagnosis not present

## 2018-12-11 DIAGNOSIS — D509 Iron deficiency anemia, unspecified: Secondary | ICD-10-CM | POA: Diagnosis not present

## 2018-12-11 DIAGNOSIS — Z1211 Encounter for screening for malignant neoplasm of colon: Secondary | ICD-10-CM | POA: Diagnosis not present

## 2018-12-11 DIAGNOSIS — Z79899 Other long term (current) drug therapy: Secondary | ICD-10-CM | POA: Diagnosis not present

## 2018-12-11 DIAGNOSIS — Z1331 Encounter for screening for depression: Secondary | ICD-10-CM | POA: Diagnosis not present

## 2018-12-11 DIAGNOSIS — Z6822 Body mass index (BMI) 22.0-22.9, adult: Secondary | ICD-10-CM | POA: Diagnosis not present

## 2018-12-11 DIAGNOSIS — Z0000 Encounter for general adult medical examination without abnormal findings: Secondary | ICD-10-CM | POA: Diagnosis not present

## 2018-12-11 DIAGNOSIS — E7800 Pure hypercholesterolemia, unspecified: Secondary | ICD-10-CM | POA: Diagnosis not present

## 2018-12-11 DIAGNOSIS — I10 Essential (primary) hypertension: Secondary | ICD-10-CM | POA: Diagnosis not present

## 2018-12-11 DIAGNOSIS — Z1339 Encounter for screening examination for other mental health and behavioral disorders: Secondary | ICD-10-CM | POA: Diagnosis not present

## 2018-12-11 DIAGNOSIS — Z299 Encounter for prophylactic measures, unspecified: Secondary | ICD-10-CM | POA: Diagnosis not present

## 2019-01-23 DIAGNOSIS — I10 Essential (primary) hypertension: Secondary | ICD-10-CM | POA: Diagnosis not present

## 2019-01-23 DIAGNOSIS — J45909 Unspecified asthma, uncomplicated: Secondary | ICD-10-CM | POA: Diagnosis not present

## 2019-01-23 DIAGNOSIS — E119 Type 2 diabetes mellitus without complications: Secondary | ICD-10-CM | POA: Diagnosis not present

## 2019-01-23 DIAGNOSIS — I2510 Atherosclerotic heart disease of native coronary artery without angina pectoris: Secondary | ICD-10-CM | POA: Diagnosis not present

## 2019-02-08 DIAGNOSIS — Z23 Encounter for immunization: Secondary | ICD-10-CM | POA: Diagnosis not present

## 2019-03-01 DIAGNOSIS — I10 Essential (primary) hypertension: Secondary | ICD-10-CM | POA: Diagnosis not present

## 2019-03-01 DIAGNOSIS — M545 Low back pain: Secondary | ICD-10-CM | POA: Diagnosis not present

## 2019-03-01 DIAGNOSIS — N400 Enlarged prostate without lower urinary tract symptoms: Secondary | ICD-10-CM | POA: Diagnosis not present

## 2019-03-01 DIAGNOSIS — R300 Dysuria: Secondary | ICD-10-CM | POA: Diagnosis not present

## 2019-03-01 DIAGNOSIS — Z299 Encounter for prophylactic measures, unspecified: Secondary | ICD-10-CM | POA: Diagnosis not present

## 2019-03-01 DIAGNOSIS — E1165 Type 2 diabetes mellitus with hyperglycemia: Secondary | ICD-10-CM | POA: Diagnosis not present

## 2019-03-01 DIAGNOSIS — Z6822 Body mass index (BMI) 22.0-22.9, adult: Secondary | ICD-10-CM | POA: Diagnosis not present

## 2019-03-04 DIAGNOSIS — I10 Essential (primary) hypertension: Secondary | ICD-10-CM | POA: Diagnosis not present

## 2019-03-04 DIAGNOSIS — J45909 Unspecified asthma, uncomplicated: Secondary | ICD-10-CM | POA: Diagnosis not present

## 2019-03-04 DIAGNOSIS — I2510 Atherosclerotic heart disease of native coronary artery without angina pectoris: Secondary | ICD-10-CM | POA: Diagnosis not present

## 2019-03-04 DIAGNOSIS — E119 Type 2 diabetes mellitus without complications: Secondary | ICD-10-CM | POA: Diagnosis not present

## 2019-03-08 DIAGNOSIS — Z23 Encounter for immunization: Secondary | ICD-10-CM | POA: Diagnosis not present

## 2019-04-01 DIAGNOSIS — I10 Essential (primary) hypertension: Secondary | ICD-10-CM | POA: Diagnosis not present

## 2019-04-01 DIAGNOSIS — E119 Type 2 diabetes mellitus without complications: Secondary | ICD-10-CM | POA: Diagnosis not present

## 2019-04-01 DIAGNOSIS — J45909 Unspecified asthma, uncomplicated: Secondary | ICD-10-CM | POA: Diagnosis not present

## 2019-04-01 DIAGNOSIS — I2510 Atherosclerotic heart disease of native coronary artery without angina pectoris: Secondary | ICD-10-CM | POA: Diagnosis not present

## 2019-05-23 DIAGNOSIS — I10 Essential (primary) hypertension: Secondary | ICD-10-CM | POA: Diagnosis not present

## 2019-05-23 DIAGNOSIS — J45909 Unspecified asthma, uncomplicated: Secondary | ICD-10-CM | POA: Diagnosis not present

## 2019-05-23 DIAGNOSIS — I2510 Atherosclerotic heart disease of native coronary artery without angina pectoris: Secondary | ICD-10-CM | POA: Diagnosis not present

## 2019-05-23 DIAGNOSIS — E119 Type 2 diabetes mellitus without complications: Secondary | ICD-10-CM | POA: Diagnosis not present

## 2019-06-10 DIAGNOSIS — M545 Low back pain: Secondary | ICD-10-CM | POA: Diagnosis not present

## 2019-06-10 DIAGNOSIS — I10 Essential (primary) hypertension: Secondary | ICD-10-CM | POA: Diagnosis not present

## 2019-06-10 DIAGNOSIS — E7800 Pure hypercholesterolemia, unspecified: Secondary | ICD-10-CM | POA: Diagnosis not present

## 2019-06-10 DIAGNOSIS — Z299 Encounter for prophylactic measures, unspecified: Secondary | ICD-10-CM | POA: Diagnosis not present

## 2019-06-10 DIAGNOSIS — Z6822 Body mass index (BMI) 22.0-22.9, adult: Secondary | ICD-10-CM | POA: Diagnosis not present

## 2019-06-10 DIAGNOSIS — E1165 Type 2 diabetes mellitus with hyperglycemia: Secondary | ICD-10-CM | POA: Diagnosis not present

## 2019-06-10 DIAGNOSIS — G20 Parkinson's disease: Secondary | ICD-10-CM | POA: Diagnosis not present

## 2019-06-27 DIAGNOSIS — Z299 Encounter for prophylactic measures, unspecified: Secondary | ICD-10-CM | POA: Diagnosis not present

## 2019-06-27 DIAGNOSIS — I10 Essential (primary) hypertension: Secondary | ICD-10-CM | POA: Diagnosis not present

## 2019-06-27 DIAGNOSIS — Z789 Other specified health status: Secondary | ICD-10-CM | POA: Diagnosis not present

## 2019-06-27 DIAGNOSIS — M5442 Lumbago with sciatica, left side: Secondary | ICD-10-CM | POA: Diagnosis not present

## 2019-07-24 DIAGNOSIS — E119 Type 2 diabetes mellitus without complications: Secondary | ICD-10-CM | POA: Diagnosis not present

## 2019-07-24 DIAGNOSIS — J45909 Unspecified asthma, uncomplicated: Secondary | ICD-10-CM | POA: Diagnosis not present

## 2019-07-24 DIAGNOSIS — I10 Essential (primary) hypertension: Secondary | ICD-10-CM | POA: Diagnosis not present

## 2019-07-24 DIAGNOSIS — I2510 Atherosclerotic heart disease of native coronary artery without angina pectoris: Secondary | ICD-10-CM | POA: Diagnosis not present

## 2019-08-23 DIAGNOSIS — E119 Type 2 diabetes mellitus without complications: Secondary | ICD-10-CM | POA: Diagnosis not present

## 2019-08-23 DIAGNOSIS — J45909 Unspecified asthma, uncomplicated: Secondary | ICD-10-CM | POA: Diagnosis not present

## 2019-08-23 DIAGNOSIS — I10 Essential (primary) hypertension: Secondary | ICD-10-CM | POA: Diagnosis not present

## 2019-08-23 DIAGNOSIS — I2510 Atherosclerotic heart disease of native coronary artery without angina pectoris: Secondary | ICD-10-CM | POA: Diagnosis not present

## 2019-09-13 DIAGNOSIS — E1165 Type 2 diabetes mellitus with hyperglycemia: Secondary | ICD-10-CM | POA: Diagnosis not present

## 2019-09-13 DIAGNOSIS — G20 Parkinson's disease: Secondary | ICD-10-CM | POA: Diagnosis not present

## 2019-09-13 DIAGNOSIS — M545 Low back pain: Secondary | ICD-10-CM | POA: Diagnosis not present

## 2019-09-13 DIAGNOSIS — Z299 Encounter for prophylactic measures, unspecified: Secondary | ICD-10-CM | POA: Diagnosis not present

## 2019-09-13 DIAGNOSIS — N400 Enlarged prostate without lower urinary tract symptoms: Secondary | ICD-10-CM | POA: Diagnosis not present

## 2019-09-13 DIAGNOSIS — I10 Essential (primary) hypertension: Secondary | ICD-10-CM | POA: Diagnosis not present

## 2019-09-25 ENCOUNTER — Encounter: Payer: Self-pay | Admitting: Family Medicine

## 2019-09-25 MED ORDER — ROPINIROLE HCL 0.25 MG PO TABS: 0.2500 mg | ORAL_TABLET | Freq: Every day | ORAL | 11 refills | Status: DC

## 2019-10-24 DIAGNOSIS — E119 Type 2 diabetes mellitus without complications: Secondary | ICD-10-CM | POA: Diagnosis not present

## 2019-10-24 DIAGNOSIS — J45909 Unspecified asthma, uncomplicated: Secondary | ICD-10-CM | POA: Diagnosis not present

## 2019-10-24 DIAGNOSIS — I10 Essential (primary) hypertension: Secondary | ICD-10-CM | POA: Diagnosis not present

## 2019-10-24 DIAGNOSIS — I2510 Atherosclerotic heart disease of native coronary artery without angina pectoris: Secondary | ICD-10-CM | POA: Diagnosis not present

## 2019-10-30 ENCOUNTER — Encounter: Payer: Self-pay | Admitting: Family Medicine

## 2019-10-30 ENCOUNTER — Ambulatory Visit (INDEPENDENT_AMBULATORY_CARE_PROVIDER_SITE_OTHER): Payer: Medicare Other | Admitting: Family Medicine

## 2019-10-30 VITALS — BP 177/86 | HR 69 | Ht 62.0 in | Wt 140.0 lb

## 2019-10-30 DIAGNOSIS — Z23 Encounter for immunization: Secondary | ICD-10-CM | POA: Diagnosis not present

## 2019-10-30 DIAGNOSIS — G20 Parkinson's disease: Secondary | ICD-10-CM | POA: Diagnosis not present

## 2019-10-30 MED ORDER — CARBIDOPA-LEVODOPA ER 25-100 MG PO TBCR: 1.0000 | EXTENDED_RELEASE_TABLET | Freq: Four times a day (QID) | ORAL | 3 refills | Status: DC

## 2019-10-30 MED ORDER — CARBIDOPA-LEVODOPA 25-100 MG PO TABS: 1.0000 | ORAL_TABLET | Freq: Four times a day (QID) | ORAL | 3 refills | Status: DC

## 2019-10-30 MED ORDER — ROPINIROLE HCL 0.25 MG PO TABS
0.2500 mg | ORAL_TABLET | Freq: Every day | ORAL | 3 refills | Status: DC
Start: 2019-10-30 — End: 2020-08-03

## 2019-10-30 NOTE — Progress Notes (Addendum)
PATIENT: Victor Bell DOB: 07/22/1922  REASON FOR VISIT: follow up HISTORY FROM: patient  Chief Complaint  Patient presents with  . Follow-up    rm 6  . Tremors    pt needs medication from a neuro dept for insurance. Pt said his right leg goes to sleep often.     HISTORY OF PRESENT ILLNESS: Bell 10/30/19 Victor Bell is a 86 y.o. male here Bell for follow up for PD. He continues Sinemet 1 tablet QID and Sinemet CR 1 tab QID. We added Requip 0.49m at night for breakthrough jerking and restless legs. He reports that he is doing quite well. He presents with his daughter who aids in history. He remains fairly active. No falls. He walks with his cane. He is eating and drinking normally. No trouble swallowing. Some changes in taste. He does note numbness in right lower extremity for about 30 minutes when waking in the mornings. He has significant lumbar spine disease. He is unsure if numbness occurs when sitting for prolonged periods of time. Numbness always resolves within 30 minutes of waking.   HISTORY: (copied from my note on 10/25/2018)  Victor Bell a 86y.o. male here Bell for follow up for PD.  He feels that overall he is doing well.  He presents Bell with his daughter who agrees.  He continues Sinemet 1 tablet 4 times daily as well as Sinemet CR 1 tablet 4 times daily.  He does feel that this helps somewhat with his tremor.  He denies changes in gait.  He has had some increased numbness of bilateral lower extremities, worse in the right leg status post stroke.  Numbness improves when he gets up and walks around.  He has had a couple of falls.  No injuries.  He declines offer for physical therapy at this time.  He is following with Dr. VWoody Sellerclosely and was instructed to try compression stockings at home.  He is not interested in this at this time.  Remeron continues to help with sleep.  He is eating and drinking well.  No trouble swallowing.  Memory seems stable.  He continues  Plavix and Lipitor for stroke prevention.  Blood pressure is usually normal at home.  Metformin dose was reduced recently due to cold A1c.  He has no concerns Bell.  HISTORY: (copied from Dr AGuadelupe Sabinnote on 08/22/2017)  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 The patient is accompanied by his daughter, Victor Bell. I last saw him on I saw him on 04/19/2017, at which time he reported recuperating slowly from his recent stroke. He was in the hospital for a acute left sided ischemic stroke deemed secondary to left carotid artery stenosis. He had developed side effects on gabapentin which was restarted by his neurosurgeon but stopped again. He had developed right shoulder pain and received an injection in it, had to take Vicodin half a pill every 4 hours.  Bell, 08/22/2017 (all dictated new, as well as above notes, some dictation done in note pad or Word, outside of chart, may appear as copied): He reports feeling stable.However, he had a recent fall. He scraped his LUE and LLE. Happened at homeabout 3-4 weeks ago, fell on his left side and scraped his arm and knee  but also hit his mouth. He needed a dentist appointment.as I understand, he was in his garage and was trying to get away frombees or hornets.He had TIA type Sx with slurring of speech in 04/2017, went to [facility], had tele-neurology consult, stayedovernight. CT negative for acute findings, this was on 05/15/17. Heart murmur was checked out, had echo with Dr. Woody Seller and had FU with Dr. Oran Rein, routinely. Takes his C/L IR and CR two to 3 times a day. BT is around 10 ish. He no longer is on the Vicodin, no more benazepril. Less sleepy during the day.  On Remeron for appetite, has since then gained a few lb.  The patient's allergies, current medications, family history, past medical history, past social history, past surgical history and problem list were reviewed and updated as appropriate.   Previously (copied from previous notes for reference):   09/19/2016, at which time he reported 3 recent falls, also lightheadedness. He was also overdoing things including painting the outside of the house and redoing the driveway. He was not always titrating well enough. He had one episode of transient one-sided weakness. We talked about gait safety and fall risk quite a bit.  I saw him on 03/22/2016, at which time he reported feeling fairly stable. He did have one fall a few months back, while [place] the beach. Thankfully, he did not hurt himself, fell in the bathroom and pulled the shower curtain down. He was having some back pain, was avoiding tramadol. He had some trouble falling asleep with a long-standing history of difficulty with sleep maintenance. He had tried sleeping pills in the past. He was on Sinemet immediate release and CR 1 pill 3 times a day. I suggested we continue with his regimen. His daughter emailed in the interim in May 2018 requesting whether we could increase his Sinemet IR and CR to 1 pill 4 times a day each. I advised her that we could try this.  I saw him on 11/17/2015, at which time he reported doing okay, he had some back pain, for which he had epidural steroid injections. He reported leg pains and history of restless leg syndrome. He had flareup of allergy symptoms, had gone through allergy shots decades ago and was on over-the-counter allergy medication. I asked him to continue with Sinemet and Sinemet CR. He was advised to avoid any decongestants over-the-counter.I suggested we start him on a trial of low-dose gabapentin for his restless leg syndrome. He was advised to avoid any benzodiazepines. His daughter emailed in  November 2017 with concerns that he still had restless leg symptoms. I suggested we gradually increased his gabapentin to 200 mg, then 300 mg daily.  Of note, they had to cancel an appointment for 10/06/2015. I saw him on 06/01/2015, at which time he reported not feeling much in the way of difference on the long acting vs. the IR C/L. He would not always remember to take the 3 pills a day, sometimes in midday he would take the IR. He had an increase in the metformin. He lost about 10 lb in 3 months. He felt that his tremor was getting worse. He had occasional lightheadedness upon standing quickly. He cannot exercise very much because of lumbar spinal stenosis and lower back pain, sometimes radiating to the left. He had no recent falls. I suggested he take 1 pill of immediate release Sinemet along with one CR 3 times a day for a total of 6 pills a day. I first met him  on 03/03/2015 at the request of his primary care physician, at which time he reported a prior diagnosis of Parkinson's disease and he is to follow with a neurologist moved away. He was on Sinemet. He was not always remembering his medication. We switched his Sinemet to Sinemet CR 25-100 milligrams strength one tablet 3 times a day about 4 hourly dosing.  03/03/2015: He was previously diagnosed with Parkinson's disease. He has seen Dr. Brandon Melnick. Symptoms date back to 6 years ago with mild progression noted. He started noticing a right hand tremor at rest about 6 years ago. Symptoms have progressed very mildly over the course of time. He has been on C/L for about 4 years.   He has been on Sinemet. He is currently on 25-100 milligrams strength one pill 3 to 4 times a day. He has mild constipation which is manageable. He does not like to drink water and drinks perhaps one or 2 cups a day. He likes to drink coffee in the morning. He lives with his 54 year old wife, they have 2 grown children, both in the area. Daughter checks on them every day. He  has a son who lives about 10 miles away. He worked for [employer]. Thankfully he has not fallen recently. He has a cane and a walker but does not use his walker very much. Sometimes he uses a cane inside the house. He feels that the Sinemet has been helpful. He does not always remember the midday dose and usually averages 2-3 pills a day. It is written for 4 times a day but he does not typically take it 4 times a day. He feels that it lasts about 3 maybe 4 hours in between. He does not report any significant side effects. In particular, he denies any nausea, headache, hallucinations, he has had some blood pressure decrease with time. He has problems sleeping at night at times and is somewhat sleepy during the day and dozes off involuntarily. He has been driving but has limited his driving to daytime driving and local roads only.  He has not noticed any involuntary movements such as we would call dyskinesias. His neurologist moved away. I reviewed your office note from 01/22/2015, which you kindly included.   REVIEW OF SYSTEMS: Out of a complete 14 system review of symptoms, the patient complains only of the following symptoms, right leg numbness, back pain, imbalance, and all other reviewed systems are negative.  ALLERGIES: Allergies  Allergen Reactions  . Niacin And Related Other (See Comments)    Burning     HOME MEDICATIONS: Outpatient Medications Prior to Visit  Medication Sig Dispense Refill  . atorvastatin (LIPITOR) 80 MG tablet Take 80 mg by mouth daily.      . carbidopa-levodopa (SINEMET IR) 25-100 MG tablet Take 1 tablet by mouth 4 (four) times daily. Pt takes carbidopa-levodopa extended release and other at same time.     . Carbidopa-Levodopa ER (SINEMET CR) 25-100 MG tablet controlled release Take 1 tablet by mouth 4 (four) times daily.     . cetirizine (ZYRTEC) 10 MG tablet Take 10 mg by mouth daily.    . clopidogrel (PLAVIX) 75 MG tablet Take 1 tablet (75 mg total) by mouth  daily. 30 tablet 0  . dutasteride (AVODART) 0.5 MG capsule Take 0.5 mg by mouth daily.      . finasteride (PROSCAR) 5 MG tablet Take 5 mg by mouth daily.     . fluticasone (FLONASE) 50 MCG/ACT nasal spray Place 1 spray into  both nostrils daily.    . metFORMIN (GLUCOPHAGE) 500 MG tablet Take 500 mg by mouth daily.     . metoprolol (TOPROL-XL) 50 MG 24 hr tablet Take 25 mg by mouth daily.     . mirtazapine (REMERON) 7.5 MG tablet Take 7.5 mg by mouth every evening.    . nabumetone (RELAFEN) 500 MG tablet Take 500 mg by mouth 2 (two) times daily.      Marland Kitchen omeprazole (PRILOSEC) 40 MG capsule Take 40 mg by mouth daily.     . polyethylene glycol powder (GLYCOLAX/MIRALAX) powder Take 1 Container by mouth daily.     Marland Kitchen rOPINIRole (REQUIP) 0.25 MG tablet Take 1 tablet (0.25 mg total) by mouth at bedtime. 30 tablet 11  . Tamsulosin HCl (FLOMAX) 0.4 MG CAPS Take 0.4 mg by mouth daily.      . traMADol (ULTRAM) 50 MG tablet Take 50 mg by mouth every 6 (six) hours as needed.     No facility-administered medications prior to visit.    PAST MEDICAL HISTORY: Past Medical History:  Diagnosis Date  . Arthritis   . Asthma   . CAD (coronary artery disease)   . Cancer ([place])    skin   . Carotid artery occlusion   . CHF (congestive heart failure) ([place])   . Colon polyp   . COPD (chronic obstructive pulmonary disease) ([place])   . Diabetes mellitus   . GERD (gastroesophageal reflux disease)   . Heart disease   . Hyperlipidemia   . Hypertension   . Myocardial infarction ([place])    1998    PAST SURGICAL HISTORY: Past Surgical History:  Procedure Laterality Date  . CATARACT EXTRACTION    . CHOLECYSTECTOMY     Gall bladder  . CORONARY ARTERY BYPASS GRAFT  10/03/96   x7  . TONSILLECTOMY      FAMILY HISTORY: Family History  Problem Relation Age of Onset  . Heart disease Mother   . Aneurysm Sister     SOCIAL HISTORY: Social History   Socioeconomic History  . Marital status: Married    Spouse name:  Not on file  . Number of children: 1  . Years of education: 33  . Highest education level: Not on file  Occupational History  . Occupation: Retired  Tobacco Use  . Smoking status: Never Smoker  . Smokeless tobacco: Never Used  Vaping Use  . Vaping Use: Never used  Substance and Sexual Activity  . Alcohol use: No    Alcohol/week: 0.0 standard drinks  . Drug use: No  . Sexual activity: Not on file  Other Topics Concern  . Not on file  Social History Narrative   Drinks coffee in the morning    Social Determinants of Health   Financial Resource Strain:   . Difficulty of Paying Living Expenses: Not on file  Food Insecurity:   . Worried About Charity fundraiser in the Last Year: Not on file  . Ran Out of Food in the Last Year: Not on file  Transportation Needs:   . Lack of Transportation (Medical): Not on file  . Lack of Transportation (Non-Medical): Not on file  Physical Activity:   . Days of Exercise per Week: Not on file  . Minutes of Exercise per Session: Not on file  Stress:   . Feeling of Stress : Not on file  Social Connections:   . Frequency of Communication with Friends and Family: Not on file  . Frequency of Social Gatherings with  Friends and Family: Not on file  . Attends Religious Services: Not on file  . Active Member of Clubs or Organizations: Not on file  . Attends Archivist Meetings: Not on file  . Marital Status: Not on file  Intimate Partner Violence:   . Fear of Current or Ex-Partner: Not on file  . Emotionally Abused: Not on file  . Physically Abused: Not on file  . Sexually Abused: Not on file      PHYSICAL EXAM  Vitals:   10/30/19 1255  BP: (!) 177/86  Pulse: 69  Weight: 140 lb (63.5 kg)  Height: '5\' 2"'  (1.575 m)   Body mass index is 25.61 kg/m.  Generalized: Well developed, in no acute distress  Cardiology: normal rate and rhythm, no murmur noted Respiratory: clear to auscultation bilaterally  Neurological examination    Mentation: Alert oriented to time, place, history taking. Follows all commands speech and language fluent Cranial nerve II-XII: Pupils were equal round reactive to light. Extraocular movements were full, visual field were full on confrontational test. Facial sensation and strength were normal. Head turning and shoulder shrug  were normal and symmetric. Motor: The motor testing reveals 5 over 5 strength of all 4 extremities with exception of right hip flexion 4/5.  Sensory: Sensory testing is intact to soft touch on all 4 extremities. No evidence of extinction is noted.  Coordination: Cerebellar testing reveals good finger-nose-finger and reduced  heel-to-shin bilaterally.  Gait and station: Gait is mildly short, stable with cane, decreased arm swing   DIAGNOSTIC DATA (LABS, IMAGING, TESTING) - I reviewed patient records, labs, notes, testing and imaging myself where available.  No flowsheet data found.   Lab Results  Component Value Date   WBC 8.1 02/21/2017   HGB 11.0 (L) 02/21/2017   HCT 33.1 (L) 02/21/2017   MCV 90.4 02/21/2017   PLT 146 (L) 02/21/2017      Component Value Date/Time   NA 139 02/21/2017 1124   K 4.5 02/21/2017 1124   CL 105 02/21/2017 1124   CO2 21 (L) 02/21/2017 1117   GLUCOSE 135 (H) 02/21/2017 1124   BUN 24 (H) 02/21/2017 1124   CREATININE 1.30 (H) 02/21/2017 2043   CALCIUM 9.3 02/21/2017 1117   PROT 6.8 02/21/2017 1117   ALBUMIN 3.9 02/21/2017 1117   AST 16 02/21/2017 1117   ALT 8 (L) 02/21/2017 1117   ALKPHOS 76 02/21/2017 1117   BILITOT 1.0 02/21/2017 1117   GFRNONAA 46 (L) 02/21/2017 2043   GFRAA 53 (L) 02/21/2017 2043   Lab Results  Component Value Date   CHOL 139 02/22/2017   HDL 36 (L) 02/22/2017   LDLCALC 71 02/22/2017   TRIG 162 (H) 02/22/2017   CHOLHDL 3.9 02/22/2017   Lab Results  Component Value Date   HGBA1C 6.5 (H) 02/22/2017   No results found for: VITAMINB12 No results found for: TSH     ASSESSMENT AND PLAN 86 y.o. year  old male  has a past medical history of Arthritis, Asthma, CAD (coronary artery disease), Cancer ([place]), Carotid artery occlusion, CHF (congestive heart failure) ([place]), Colon polyp, COPD (chronic obstructive pulmonary disease) ([place]), Diabetes mellitus, GERD (gastroesophageal reflux disease), Heart disease, Hyperlipidemia, Hypertension, and Myocardial infarction ([place]). here with     ICD-10-CM   1. Primary parkinsonism [facility])  G20     Mr Isakson is doing very well, Bell. He continues Sinemet IR and CR 1 tablet four times daily. We will continue current treatment plan. He is tolerating  well with no obvious adverse effects. Requip has helped significantly with night symptoms. He is now resting better. He does have some intermittent numbness first thing in the morning that lasts about 30 minutes. I am concerned that this is related to lumbar spine disease. He does have tenderness in lumbar area of back with strength testing. I have advised that he consider using a pillow under his knees at night or elevating head of bead. PT could be considered for strengthening and stretching if he feels it would help. He was encouraged to remain active. Fall precautions reviewed. He will continue close follow up with PCP. He will return in 6-12 months.    No orders of the defined types were placed in this encounter.    No orders of the defined types were placed in this encounter.     I spent 15 minutes with the patient. 50% of this time was spent counseling and educating patient on plan of care and medications.    Debbora Presto, FNP-C 10/30/2019, 1:10 PM [facility] [address], [place], [place] [address] [phone]  I reviewed the above note and documentation by the Nurse Practitioner and agree with the history, exam, assessment and plan as outlined above. I was available for consultation. Star Age, MD, PhD [facility])

## 2019-10-30 NOTE — Patient Instructions (Addendum)
We will continue current treatment plan. I have called in medications to your mail order pharmacy. Please continue to be as active as possible. Follow up closely with PCP.   Follow up with [place] in 6-12 months     Carbidopa; Levodopa sustained-release tablets What is this medicine? CARBIDOPA; LEVODOPA (kar bi DOE pa; lee voe DOE pa) is used to treat the symptoms of Parkinson's disease. This medicine may be used for other purposes; ask your health care provider or pharmacist if you have questions. COMMON BRAND NAME(S): SINEMET, SINEMET CR What should I tell my health care provider before I take this medicine? They need to know if you have any of these conditions:  depression or other mental illness  diabetes  glaucoma  heart disease, including history of a heart attack  history of irregular heartbeat  kidney disease  liver disease  lung or breathing disease, like asthma  narcolepsy  sleep apnea  stomach or intestine problems  an unusual or allergic reaction to levodopa, carbidopa, other medicines, foods, dyes, or preservatives  pregnant or trying to get pregnant  breast-feeding How should I use this medicine? Take this medicine by mouth with a glass of water. Follow the directions on the prescription label. Swallow whole. Do not crush or chew. You may cut the tablets in half. Take your doses at regular intervals. Do not take your medicine more often than directed. Do not stop taking except on the advice of your doctor or health care professional. Talk to your pediatrician regarding the use of this medicine in children. Special care may be needed. Overdosage: If you think you have taken too much of this medicine contact a poison control center or emergency room at once. NOTE: This medicine is only for you. Do not share this medicine with others. What if I miss a dose? If you miss a dose, take it as soon as you can. If it is almost time for your next dose, take only that  dose. Do not take double or extra doses. What may interact with this medicine? Do not take this medicine with any of the following medications:  MAOIs like Marplan, Nardil, and Parnate  reserpine  tetrabenazine This medicine may also interact with the following medications:  alcohol  droperidol  entacapone  iron supplements or multivitamins with iron  isoniazid, INH  linezolid  medicines for depression, anxiety, or psychotic disturbances  medicines for high blood pressure  medicines for sleep  metoclopramide  papaverine  procarbazine  tedizolid  rasagiline  selegiline  tolcapone This list may not describe all possible interactions. Give your health care provider a list of all the medicines, herbs, non-prescription drugs, or dietary supplements you use. Also tell them if you smoke, drink alcohol, or use illegal drugs. Some items may interact with your medicine. What should I watch for while using this medicine? Visit your health care professional for regular checks on your progress. Tell your health care professional if your symptoms do not start to get better or if they get worse. Do not stop taking except on your health care professional's advice. You may develop a severe reaction. Your health care professional will tell you how much medicine to take. You may get drowsy or dizzy. Do not drive, use machinery, or do anything that needs mental alertness until you know how this drug affects you. Do not stand or sit up quickly, especially if you are an older patient. This reduces the risk of dizzy or fainting spells. Alcohol may  interfere with the effect of this medicine. Avoid alcoholic drinks. When taking this medicine, you may fall asleep without notice. You may be doing activities like driving a car, talking, or eating. You may not feel drowsy before it happens. Contact your health care provider right away if this happens to you. There have been reports of increased  sexual urges or other strong urges such as gambling while taking this medicine. If you experience any of these while taking this medicine, you should report this to your health care provider as soon as possible. You may experience a "wearing off" effect prior to the time for your next dose of this medicine. You may also experience an "on-off" effect where the medicine apparently stops working for anything from a minute to several hours, then suddenly starts working again. Tell your doctor or health care professional if any of these symptoms happen to you. Your dose may need adjustment. A high protein diet can slow or prevent absorption of this medicine. Avoid high protein foods near the time of taking this medicine to help to prevent these problems. Take this medicine at least 30 minutes before eating or one hour after meals. You may want to eat higher protein foods later in the day or in small amounts. Discuss your diet with your doctor or health care professional or nutritionist. If you have diabetes, you may get a false-positive result for sugar in your urine. Check with your doctor or health care professional. This medicine may discolor the urine or sweat, making it look darker or red in color. This is of no cause for concern. However, this may stain clothing or fabrics. This medicine may cause a decrease in vitamin B6. You should make sure that you get enough vitamin B6 while you are taking this medicine. Discuss the foods you eat and the vitamins you take with your health care professional. What side effects may I notice from receiving this medicine? Side effects that you should report to your doctor or health care professional as soon as possible:  allergic reactions like skin rash, itching or hives, swelling of the face, lips, or tongue  changes in emotions or moods  falling asleep during normal activities like driving  fast, irregular heartbeat  feeling faint or lightheaded,  falls  fever  hallucinations  new or increased gambling urges, sexual urges, uncontrolled spending, binge or compulsive eating, or other urges  stomach pain  trouble passing urine or change in the amount of urine  uncontrollable movements of the arms, face, head, mouth, neck, or upper body Side effects that usually do not require medical attention (report to your doctor or health care professional if they continue or are bothersome):  dizziness  headache  loss of appetite  nausea  trouble sleeping This list may not describe all possible side effects. Call your doctor for medical advice about side effects. You may report side effects to FDA at 1-800-FDA-1088. Where should I keep my medicine? Keep out of the reach of children. Store below 30 degrees C (86 degrees F). Keep container tightly closed. Throw away any unused medicine after the expiration date. NOTE: This sheet is a summary. It may not cover all possible information. If you have questions about this medicine, talk to your doctor, pharmacist, or health care provider.  2020 Elsevier/Gold Standard (2018-09-17 19:49:11)    Ropinirole tablets What is this medicine? ROPINIROLE (roe PIN i role) is used to treat the symptoms of Parkinson's disease. It helps to improve muscle control  and movement difficulties. It is also used for the treatment of Restless Legs Syndrome. This medicine may be used for other purposes; ask your health care provider or pharmacist if you have questions. COMMON BRAND NAME(S): Requip What should I tell my health care provider before I take this medicine? They need to know if you have any of these conditions:  heart disease  high blood pressure  kidney disease  liver disease  low blood pressure  narcolepsy  sleep apnea  an unusual or allergic reaction to ropinirole, other medicines, foods, dyes, or preservatives  pregnant or trying to get pregnant  breast-feeding How should I use  this medicine? Take this medicine by mouth with a glass of water. Follow the directions on the prescription label. You can take it with or without food. If it upsets your stomach, take it with food. Take your doses at regular intervals. Do not take your medicine more often than directed. Do not stop taking this medicine except on your doctor's advice. Stopping this medicine too quickly may cause serious side effects. Talk to your pediatrician regarding the use of this medicine in children. Special care may be needed. Overdosage: If you think you have taken too much of this medicine contact a poison control center or emergency room at once. NOTE: This medicine is only for you. Do not share this medicine with others. What if I miss a dose? If you miss a dose, take it as soon as you can. If it is almost time for your next dose, take only that dose. Do not take double or extra doses. What may interact with this medicine?  certain medicines for depression, mood, or psychotic disorders  ciprofloxacin  male hormones, like estrogens and birth control pills  fluvoxamine  metoclopramide  mexiletine  norfloxacin  omeprazole  rifampin This list may not describe all possible interactions. Give your health care provider a list of all the medicines, herbs, non-prescription drugs, or dietary supplements you use. Also tell them if you smoke, drink alcohol, or use illegal drugs. Some items may interact with your medicine. What should I watch for while using this medicine? Visit your health care professional for regular checks on your progress. Tell your health care professional if your symptoms do not start to get better or if they get worse. Do not stop taking except on your health care professional's advice. You may develop a severe reaction. Your health care professional will tell you how much medicine to take. You may get drowsy or dizzy. Do not drive, use machinery, or do anything that needs mental  alertness until you know how this drug affects you. Do not stand or sit up quickly, especially if you are an older patient. This reduces the risk of dizzy or fainting spells. Alcohol may interfere with the effect of this medicine. Avoid alcoholic drinks. When taking this medicine, you may fall asleep without notice. You may be doing activities like driving a car, talking, or eating. You may not feel drowsy before it happens. Contact your health care provider right away if this happens to you. There have been reports of increased sexual urges or other strong urges such as gambling while taking this medicine. If you experience any of these while taking this medicine, you should report this to your health care provider as soon as possible. Your mouth may get dry. Chewing sugarless gum or sucking hard candy and drinking plenty of water may help. Contact your health care professional if the problem does  not go away or is severe. You should check your skin often for changes to moles and new growths while taking this medicine. Call your doctor if you notice any of these changes. What side effects may I notice from receiving this medicine? Side effects that you should report to your doctor or health care professional as soon as possible:  allergic reactions like skin rash, itching or hives, swelling of the face, lips, or tongue  breathing problems  changes in emotions or moods  changes in vision  chest pain  confusion  falling asleep during normal activities like driving  fast, irregular heartbeat  hallucinations  joint or muscle pain  loss of bladder control  loss of memory  new or increased gambling urges, sexual urges, uncontrolled spending, binge or compulsive eating, or other urges  pain, tingling, numbness in the hands or feet  signs and symptoms of low blood pressure like dizziness; feeling faint or lightheaded, falls; unusually weak or tired  swelling of the ankles, feet,  hands  uncontrollable movements of the arms, face, head, mouth, neck, or upper body  vomiting Side effects that usually do not require medical attention (report to your doctor or health care professional if they continue or are bothersome):  dizziness  drowsiness  headache  increased sweating  nausea This list may not describe all possible side effects. Call your doctor for medical advice about side effects. You may report side effects to FDA at 1-800-FDA-1088. Where should I keep my medicine? Keep out of the reach of children. Store at room temperature between 20 and 25 degrees C (68 and 77 degrees F). Protect from light and moisture. Keep container tightly closed. Throw away any unused medicine after the expiration date. NOTE: This sheet is a summary. It may not cover all possible information. If you have questions about this medicine, talk to your doctor, pharmacist, or health care provider.  2020 Elsevier/Gold Standard (2018-09-13 16:52:05)   Parkinson's Disease Parkinson's disease causes problems with movements. It is a long-term condition. It gets worse over time (is progressive). It affects each person in different ways. It makes it harder for you to:  Control how your body moves.  Move your body normally. The condition can range from mild to very bad (advanced). What are the causes? This condition results from a loss of brain cells called neurons. These brain cells make a chemical called dopamine, which is needed to control body movement. As the condition gets worse, the brain cells make less dopamine. This makes it hard to move or control your movements. The exact cause of this condition is not known. What increases the risk?  Being male.  Being age 30 or older.  Having family members who had Parkinson's disease.  Having had an injury to the brain.  Being very sad (depressed).  Being around things that are harmful or poisonous. What are the signs or  symptoms? Symptoms of this condition can vary. The main symptoms have to do with movement. These include:  A tremor or shaking while you are resting that you cannot control.  Stiffness in your neck, arms, and legs.  Slowing of movement. This may include: ? Losing expressions of the face. ? Having trouble making small movements that are needed to button your clothing or brush your teeth.  Walking in a way that is not normal. You may walk with short, shuffling steps.  Loss of balance when standing. You may sway, fall backward, or have trouble making turns. Other symptoms  include:  Being very sad, worried, or confused.  Seeing or hearing things that are not real.  Losing thinking abilities (dementia).  Trouble speaking or swallowing.  Having a hard time pooping (constipation).  Needing to pee right away, peeing often, or not being able to control when you pee or poop.  Sleep problems. How is this treated? There is no cure. The goal of treatment is to manage your symptoms. Treatment may include:  Medicines.  Therapy to help with talking or movement.  Surgery to reduce shaking and other movements that you cannot control. Follow these instructions at home: Medicines  Take over-the-counter and prescription medicines only as told by your doctor.  Avoid taking pain or sleeping medicines. Eating and drinking  Follow instructions from your doctor about what you cannot eat or drink.  Do not drink alcohol. Activity  Talk with your doctor about if it is safe for you to drive.  Do exercises as told by your doctor. Lifestyle      Put in grab bars and railings in your home. These help to prevent falls.  Do not use any products that contain nicotine or tobacco, such as cigarettes, e-cigarettes, and chewing tobacco. If you need help quitting, ask your doctor.  Join a support group. General instructions  Talk with your doctor about what you need help with and what your  safety needs are.  Keep all follow-up visits as told by your doctor, including any therapy visits to help with talking or moving. This is important. Contact a doctor if:  Medicines do not help your symptoms.  You feel off-balance.  You fall at home.  You need more help at home.  You have trouble swallowing.  You have a very hard time pooping.  You have a lot of side effects from your medicines.  You feel very sad, worried, or confused. Get help right away if:  You were hurt in a fall.  You see or hear things that are not real.  You cannot swallow without choking.  You have chest pain or trouble breathing.  You do not feel safe at home.  You have thoughts about hurting yourself or others. If you ever feel like you may hurt yourself or others, or have thoughts about taking your own life, get help right away. You can go to your nearest emergency department or call:  Your local emergency services (911 in the [place]  A suicide crisis helpline, such as the [facility] at 503-477-6141. This is open 24 hours a day. Summary  This condition causes problems with movements.  It is a long-term condition. It gets worse over time.  There is no cure. Treatment focuses on managing your symptoms.  Talk with your doctor about what you need help with and what your safety needs are.  Keep all follow-up visits as told by your doctor. This is important. This information is not intended to replace advice given to you by your health care provider. Make sure you discuss any questions you have with your health care provider. Document Revised: 03/29/2018 Document Reviewed: 03/29/2018 Elsevier Patient Education  [place].

## 2019-11-04 DIAGNOSIS — Z08 Encounter for follow-up examination after completed treatment for malignant neoplasm: Secondary | ICD-10-CM | POA: Diagnosis not present

## 2019-11-04 DIAGNOSIS — D225 Melanocytic nevi of trunk: Secondary | ICD-10-CM | POA: Diagnosis not present

## 2019-11-04 DIAGNOSIS — Z1283 Encounter for screening for malignant neoplasm of skin: Secondary | ICD-10-CM | POA: Diagnosis not present

## 2019-11-04 DIAGNOSIS — Z85820 Personal history of malignant melanoma of skin: Secondary | ICD-10-CM | POA: Diagnosis not present

## 2019-11-04 DIAGNOSIS — D0439 Carcinoma in situ of skin of other parts of face: Secondary | ICD-10-CM | POA: Diagnosis not present

## 2019-11-04 DIAGNOSIS — X32XXXD Exposure to sunlight, subsequent encounter: Secondary | ICD-10-CM | POA: Diagnosis not present

## 2019-11-04 DIAGNOSIS — L570 Actinic keratosis: Secondary | ICD-10-CM | POA: Diagnosis not present

## 2019-11-22 DIAGNOSIS — I2510 Atherosclerotic heart disease of native coronary artery without angina pectoris: Secondary | ICD-10-CM | POA: Diagnosis not present

## 2019-11-22 DIAGNOSIS — I10 Essential (primary) hypertension: Secondary | ICD-10-CM | POA: Diagnosis not present

## 2019-11-22 DIAGNOSIS — J45909 Unspecified asthma, uncomplicated: Secondary | ICD-10-CM | POA: Diagnosis not present

## 2019-11-22 DIAGNOSIS — E119 Type 2 diabetes mellitus without complications: Secondary | ICD-10-CM | POA: Diagnosis not present

## 2019-11-28 DIAGNOSIS — Z23 Encounter for immunization: Secondary | ICD-10-CM | POA: Diagnosis not present

## 2019-12-16 DIAGNOSIS — Z08 Encounter for follow-up examination after completed treatment for malignant neoplasm: Secondary | ICD-10-CM | POA: Diagnosis not present

## 2019-12-16 DIAGNOSIS — D044 Carcinoma in situ of skin of scalp and neck: Secondary | ICD-10-CM | POA: Diagnosis not present

## 2019-12-16 DIAGNOSIS — X32XXXD Exposure to sunlight, subsequent encounter: Secondary | ICD-10-CM | POA: Diagnosis not present

## 2019-12-16 DIAGNOSIS — L570 Actinic keratosis: Secondary | ICD-10-CM | POA: Diagnosis not present

## 2019-12-16 DIAGNOSIS — Z85828 Personal history of other malignant neoplasm of skin: Secondary | ICD-10-CM | POA: Diagnosis not present

## 2019-12-18 DIAGNOSIS — M65331 Trigger finger, right middle finger: Secondary | ICD-10-CM | POA: Diagnosis not present

## 2019-12-18 DIAGNOSIS — Z299 Encounter for prophylactic measures, unspecified: Secondary | ICD-10-CM | POA: Diagnosis not present

## 2019-12-23 DIAGNOSIS — E1165 Type 2 diabetes mellitus with hyperglycemia: Secondary | ICD-10-CM | POA: Diagnosis not present

## 2019-12-23 DIAGNOSIS — R5383 Other fatigue: Secondary | ICD-10-CM | POA: Diagnosis not present

## 2019-12-23 DIAGNOSIS — R630 Anorexia: Secondary | ICD-10-CM | POA: Diagnosis not present

## 2019-12-23 DIAGNOSIS — Z7189 Other specified counseling: Secondary | ICD-10-CM | POA: Diagnosis not present

## 2019-12-23 DIAGNOSIS — E7800 Pure hypercholesterolemia, unspecified: Secondary | ICD-10-CM | POA: Diagnosis not present

## 2019-12-23 DIAGNOSIS — Z79899 Other long term (current) drug therapy: Secondary | ICD-10-CM | POA: Diagnosis not present

## 2019-12-23 DIAGNOSIS — Z299 Encounter for prophylactic measures, unspecified: Secondary | ICD-10-CM | POA: Diagnosis not present

## 2019-12-23 DIAGNOSIS — M1990 Unspecified osteoarthritis, unspecified site: Secondary | ICD-10-CM | POA: Diagnosis not present

## 2019-12-23 DIAGNOSIS — I10 Essential (primary) hypertension: Secondary | ICD-10-CM | POA: Diagnosis not present

## 2019-12-23 DIAGNOSIS — Z0000 Encounter for general adult medical examination without abnormal findings: Secondary | ICD-10-CM | POA: Diagnosis not present

## 2019-12-23 DIAGNOSIS — N390 Urinary tract infection, site not specified: Secondary | ICD-10-CM | POA: Diagnosis not present

## 2019-12-23 DIAGNOSIS — Z6821 Body mass index (BMI) 21.0-21.9, adult: Secondary | ICD-10-CM | POA: Diagnosis not present

## 2019-12-23 DIAGNOSIS — Z1339 Encounter for screening examination for other mental health and behavioral disorders: Secondary | ICD-10-CM | POA: Diagnosis not present

## 2019-12-23 DIAGNOSIS — Z125 Encounter for screening for malignant neoplasm of prostate: Secondary | ICD-10-CM | POA: Diagnosis not present

## 2019-12-23 DIAGNOSIS — Z1331 Encounter for screening for depression: Secondary | ICD-10-CM | POA: Diagnosis not present

## 2019-12-24 DIAGNOSIS — E119 Type 2 diabetes mellitus without complications: Secondary | ICD-10-CM | POA: Diagnosis not present

## 2019-12-24 DIAGNOSIS — I2510 Atherosclerotic heart disease of native coronary artery without angina pectoris: Secondary | ICD-10-CM | POA: Diagnosis not present

## 2019-12-24 DIAGNOSIS — J45909 Unspecified asthma, uncomplicated: Secondary | ICD-10-CM | POA: Diagnosis not present

## 2019-12-24 DIAGNOSIS — I10 Essential (primary) hypertension: Secondary | ICD-10-CM | POA: Diagnosis not present

## 2020-01-23 DIAGNOSIS — I10 Essential (primary) hypertension: Secondary | ICD-10-CM | POA: Diagnosis not present

## 2020-01-23 DIAGNOSIS — I2510 Atherosclerotic heart disease of native coronary artery without angina pectoris: Secondary | ICD-10-CM | POA: Diagnosis not present

## 2020-01-23 DIAGNOSIS — J45909 Unspecified asthma, uncomplicated: Secondary | ICD-10-CM | POA: Diagnosis not present

## 2020-01-23 DIAGNOSIS — E119 Type 2 diabetes mellitus without complications: Secondary | ICD-10-CM | POA: Diagnosis not present

## 2020-01-28 DIAGNOSIS — N184 Chronic kidney disease, stage 4 (severe): Secondary | ICD-10-CM | POA: Diagnosis not present

## 2020-01-28 DIAGNOSIS — Z299 Encounter for prophylactic measures, unspecified: Secondary | ICD-10-CM | POA: Diagnosis not present

## 2020-01-28 DIAGNOSIS — Z6821 Body mass index (BMI) 21.0-21.9, adult: Secondary | ICD-10-CM | POA: Diagnosis not present

## 2020-01-28 DIAGNOSIS — R200 Anesthesia of skin: Secondary | ICD-10-CM | POA: Diagnosis not present

## 2020-01-28 DIAGNOSIS — R300 Dysuria: Secondary | ICD-10-CM | POA: Diagnosis not present

## 2020-01-28 DIAGNOSIS — M461 Sacroiliitis, not elsewhere classified: Secondary | ICD-10-CM | POA: Diagnosis not present

## 2020-01-28 DIAGNOSIS — Z789 Other specified health status: Secondary | ICD-10-CM | POA: Diagnosis not present

## 2020-01-29 DIAGNOSIS — M65341 Trigger finger, right ring finger: Secondary | ICD-10-CM | POA: Diagnosis not present

## 2020-01-29 DIAGNOSIS — Z299 Encounter for prophylactic measures, unspecified: Secondary | ICD-10-CM | POA: Diagnosis not present

## 2020-01-29 DIAGNOSIS — M65342 Trigger finger, left ring finger: Secondary | ICD-10-CM | POA: Diagnosis not present

## 2020-02-06 DIAGNOSIS — L570 Actinic keratosis: Secondary | ICD-10-CM | POA: Diagnosis not present

## 2020-02-06 DIAGNOSIS — Z85828 Personal history of other malignant neoplasm of skin: Secondary | ICD-10-CM | POA: Diagnosis not present

## 2020-02-06 DIAGNOSIS — Z08 Encounter for follow-up examination after completed treatment for malignant neoplasm: Secondary | ICD-10-CM | POA: Diagnosis not present

## 2020-02-06 DIAGNOSIS — X32XXXD Exposure to sunlight, subsequent encounter: Secondary | ICD-10-CM | POA: Diagnosis not present

## 2020-04-03 DIAGNOSIS — Z299 Encounter for prophylactic measures, unspecified: Secondary | ICD-10-CM | POA: Diagnosis not present

## 2020-04-03 DIAGNOSIS — I10 Essential (primary) hypertension: Secondary | ICD-10-CM | POA: Diagnosis not present

## 2020-04-03 DIAGNOSIS — M1990 Unspecified osteoarthritis, unspecified site: Secondary | ICD-10-CM | POA: Diagnosis not present

## 2020-04-03 DIAGNOSIS — R300 Dysuria: Secondary | ICD-10-CM | POA: Diagnosis not present

## 2020-04-03 DIAGNOSIS — J309 Allergic rhinitis, unspecified: Secondary | ICD-10-CM | POA: Diagnosis not present

## 2020-04-03 DIAGNOSIS — E1165 Type 2 diabetes mellitus with hyperglycemia: Secondary | ICD-10-CM | POA: Diagnosis not present

## 2020-04-29 DIAGNOSIS — Z961 Presence of intraocular lens: Secondary | ICD-10-CM | POA: Diagnosis not present

## 2020-04-29 DIAGNOSIS — E119 Type 2 diabetes mellitus without complications: Secondary | ICD-10-CM | POA: Diagnosis not present

## 2020-04-29 DIAGNOSIS — H4322 Crystalline deposits in vitreous body, left eye: Secondary | ICD-10-CM | POA: Diagnosis not present

## 2020-04-29 DIAGNOSIS — H04123 Dry eye syndrome of bilateral lacrimal glands: Secondary | ICD-10-CM | POA: Diagnosis not present

## 2020-04-30 DIAGNOSIS — Z23 Encounter for immunization: Secondary | ICD-10-CM | POA: Diagnosis not present

## 2020-06-23 DIAGNOSIS — M79674 Pain in right toe(s): Secondary | ICD-10-CM | POA: Diagnosis not present

## 2020-06-23 DIAGNOSIS — M779 Enthesopathy, unspecified: Secondary | ICD-10-CM | POA: Diagnosis not present

## 2020-07-07 DIAGNOSIS — E1165 Type 2 diabetes mellitus with hyperglycemia: Secondary | ICD-10-CM | POA: Diagnosis not present

## 2020-07-07 DIAGNOSIS — M542 Cervicalgia: Secondary | ICD-10-CM | POA: Diagnosis not present

## 2020-07-07 DIAGNOSIS — F3342 Major depressive disorder, recurrent, in full remission: Secondary | ICD-10-CM | POA: Diagnosis not present

## 2020-07-07 DIAGNOSIS — I10 Essential (primary) hypertension: Secondary | ICD-10-CM | POA: Diagnosis not present

## 2020-07-07 DIAGNOSIS — Z299 Encounter for prophylactic measures, unspecified: Secondary | ICD-10-CM | POA: Diagnosis not present

## 2020-07-30 ENCOUNTER — Encounter: Payer: Self-pay | Admitting: Family Medicine

## 2020-08-03 ENCOUNTER — Other Ambulatory Visit: Payer: Self-pay | Admitting: Neurology

## 2020-08-03 MED ORDER — ROPINIROLE HCL 0.25 MG PO TABS: 0.2500 mg | ORAL_TABLET | Freq: Every day | ORAL | 1 refills | Status: DC

## 2020-08-27 DIAGNOSIS — M79674 Pain in right toe(s): Secondary | ICD-10-CM | POA: Diagnosis not present

## 2020-08-27 DIAGNOSIS — M779 Enthesopathy, unspecified: Secondary | ICD-10-CM | POA: Diagnosis not present

## 2020-09-07 ENCOUNTER — Other Ambulatory Visit: Payer: Self-pay | Admitting: Neurology

## 2020-09-07 ENCOUNTER — Encounter: Payer: Self-pay | Admitting: Family Medicine

## 2020-09-07 MED ORDER — CARBIDOPA-LEVODOPA ER 25-100 MG PO TBCR: 1.0000 | EXTENDED_RELEASE_TABLET | Freq: Four times a day (QID) | ORAL | 0 refills | Status: DC

## 2020-09-07 MED ORDER — CARBIDOPA-LEVODOPA 25-100 MG PO TABS: 1.0000 | ORAL_TABLET | Freq: Four times a day (QID) | ORAL | 0 refills | Status: DC

## 2020-09-13 ENCOUNTER — Observation Stay (HOSPITAL_COMMUNITY)
Admission: EM | Admit: 2020-09-13 | Discharge: 2020-09-14 | Disposition: A | Discharge status: Home / Self Care | Payer: Medicare Other | Attending: Internal Medicine | Admitting: Internal Medicine

## 2020-09-13 ENCOUNTER — Encounter (HOSPITAL_COMMUNITY): Payer: Self-pay | Admitting: Emergency Medicine

## 2020-09-13 ENCOUNTER — Emergency Department (HOSPITAL_COMMUNITY): Payer: Medicare Other

## 2020-09-13 ENCOUNTER — Inpatient Hospital Stay (HOSPITAL_BASED_OUTPATIENT_CLINIC_OR_DEPARTMENT_OTHER): Payer: Medicare Other

## 2020-09-13 ENCOUNTER — Other Ambulatory Visit: Payer: Self-pay

## 2020-09-13 DIAGNOSIS — D72829 Elevated white blood cell count, unspecified: Secondary | ICD-10-CM | POA: Diagnosis not present

## 2020-09-13 DIAGNOSIS — G20 Parkinson's disease: Secondary | ICD-10-CM | POA: Insufficient documentation

## 2020-09-13 DIAGNOSIS — I214 Non-ST elevation (NSTEMI) myocardial infarction: Secondary | ICD-10-CM | POA: Diagnosis not present

## 2020-09-13 DIAGNOSIS — I2510 Atherosclerotic heart disease of native coronary artery without angina pectoris: Secondary | ICD-10-CM | POA: Insufficient documentation

## 2020-09-13 DIAGNOSIS — R7989 Other specified abnormal findings of blood chemistry: Secondary | ICD-10-CM

## 2020-09-13 DIAGNOSIS — Z79899 Other long term (current) drug therapy: Secondary | ICD-10-CM | POA: Diagnosis not present

## 2020-09-13 DIAGNOSIS — Z20822 Contact with and (suspected) exposure to covid-19: Secondary | ICD-10-CM | POA: Insufficient documentation

## 2020-09-13 DIAGNOSIS — Z880 Allergy status to penicillin: Secondary | ICD-10-CM | POA: Diagnosis not present

## 2020-09-13 DIAGNOSIS — D649 Anemia, unspecified: Secondary | ICD-10-CM | POA: Diagnosis not present

## 2020-09-13 DIAGNOSIS — R778 Other specified abnormalities of plasma proteins: Secondary | ICD-10-CM | POA: Insufficient documentation

## 2020-09-13 DIAGNOSIS — R0600 Dyspnea, unspecified: Secondary | ICD-10-CM

## 2020-09-13 DIAGNOSIS — R110 Nausea: Secondary | ICD-10-CM

## 2020-09-13 DIAGNOSIS — E785 Hyperlipidemia, unspecified: Secondary | ICD-10-CM | POA: Insufficient documentation

## 2020-09-13 DIAGNOSIS — J449 Chronic obstructive pulmonary disease, unspecified: Secondary | ICD-10-CM | POA: Insufficient documentation

## 2020-09-13 DIAGNOSIS — Z7902 Long term (current) use of antithrombotics/antiplatelets: Secondary | ICD-10-CM | POA: Diagnosis not present

## 2020-09-13 DIAGNOSIS — J189 Pneumonia, unspecified organism: Secondary | ICD-10-CM | POA: Diagnosis present

## 2020-09-13 DIAGNOSIS — Z7984 Long term (current) use of oral hypoglycemic drugs: Secondary | ICD-10-CM | POA: Diagnosis not present

## 2020-09-13 DIAGNOSIS — R296 Repeated falls: Secondary | ICD-10-CM | POA: Diagnosis not present

## 2020-09-13 DIAGNOSIS — J45909 Unspecified asthma, uncomplicated: Secondary | ICD-10-CM | POA: Insufficient documentation

## 2020-09-13 DIAGNOSIS — Z951 Presence of aortocoronary bypass graft: Secondary | ICD-10-CM | POA: Insufficient documentation

## 2020-09-13 DIAGNOSIS — I639 Cerebral infarction, unspecified: Secondary | ICD-10-CM | POA: Diagnosis not present

## 2020-09-13 DIAGNOSIS — I5032 Chronic diastolic (congestive) heart failure: Secondary | ICD-10-CM | POA: Diagnosis not present

## 2020-09-13 DIAGNOSIS — G2581 Restless legs syndrome: Secondary | ICD-10-CM | POA: Insufficient documentation

## 2020-09-13 DIAGNOSIS — R0602 Shortness of breath: Secondary | ICD-10-CM

## 2020-09-13 DIAGNOSIS — E1122 Type 2 diabetes mellitus with diabetic chronic kidney disease: Secondary | ICD-10-CM | POA: Insufficient documentation

## 2020-09-13 DIAGNOSIS — N1832 Chronic kidney disease, stage 3b: Secondary | ICD-10-CM | POA: Diagnosis not present

## 2020-09-13 DIAGNOSIS — N189 Chronic kidney disease, unspecified: Secondary | ICD-10-CM

## 2020-09-13 DIAGNOSIS — L89152 Pressure ulcer of sacral region, stage 2: Secondary | ICD-10-CM | POA: Diagnosis not present

## 2020-09-13 DIAGNOSIS — J9601 Acute respiratory failure with hypoxia: Secondary | ICD-10-CM | POA: Diagnosis not present

## 2020-09-13 DIAGNOSIS — N400 Enlarged prostate without lower urinary tract symptoms: Secondary | ICD-10-CM | POA: Diagnosis not present

## 2020-09-13 DIAGNOSIS — R1084 Generalized abdominal pain: Secondary | ICD-10-CM | POA: Diagnosis not present

## 2020-09-13 DIAGNOSIS — I130 Hypertensive heart and chronic kidney disease with heart failure and stage 1 through stage 4 chronic kidney disease, or unspecified chronic kidney disease: Secondary | ICD-10-CM | POA: Diagnosis not present

## 2020-09-13 DIAGNOSIS — I5023 Acute on chronic systolic (congestive) heart failure: Secondary | ICD-10-CM | POA: Diagnosis not present

## 2020-09-13 DIAGNOSIS — I959 Hypotension, unspecified: Secondary | ICD-10-CM | POA: Diagnosis not present

## 2020-09-13 DIAGNOSIS — I509 Heart failure, unspecified: Secondary | ICD-10-CM

## 2020-09-13 DIAGNOSIS — C9110 Chronic lymphocytic leukemia of B-cell type not having achieved remission: Secondary | ICD-10-CM

## 2020-09-13 DIAGNOSIS — J9691 Respiratory failure, unspecified with hypoxia: Secondary | ICD-10-CM

## 2020-09-13 DIAGNOSIS — R112 Nausea with vomiting, unspecified: Secondary | ICD-10-CM | POA: Diagnosis not present

## 2020-09-13 DIAGNOSIS — G9389 Other specified disorders of brain: Secondary | ICD-10-CM | POA: Diagnosis not present

## 2020-09-13 DIAGNOSIS — Z85828 Personal history of other malignant neoplasm of skin: Secondary | ICD-10-CM | POA: Diagnosis not present

## 2020-09-13 DIAGNOSIS — R0902 Hypoxemia: Secondary | ICD-10-CM | POA: Diagnosis not present

## 2020-09-13 DIAGNOSIS — L8990 Pressure ulcer of unspecified site, unspecified stage: Secondary | ICD-10-CM | POA: Insufficient documentation

## 2020-09-13 DIAGNOSIS — I110 Hypertensive heart disease with heart failure: Secondary | ICD-10-CM | POA: Diagnosis not present

## 2020-09-13 LAB — HEMOGLOBIN A1C
Hgb A1c MFr Bld: 6.2 % — ABNORMAL HIGH (ref 4.8–5.6)
Mean Plasma Glucose: 131.24 mg/dL

## 2020-09-13 LAB — CBC WITH DIFFERENTIAL/PLATELET
Abs Immature Granulocytes: 1.43 10*3/uL — ABNORMAL HIGH (ref 0.00–0.07)
Band Neutrophils: 1 %
Band Neutrophils: 4 %
Basophils Absolute: 0 10*3/uL (ref 0.0–0.1)
Basophils Absolute: 0 10*3/uL (ref 0.0–0.1)
Basophils Relative: 0 %
Basophils Relative: 0 %
Eosinophils Absolute: 0 10*3/uL (ref 0.0–0.5)
Eosinophils Absolute: 0 10*3/uL (ref 0.0–0.5)
Eosinophils Relative: 0 %
Eosinophils Relative: 0 %
HCT: 32.9 % — ABNORMAL LOW (ref 39.0–52.0)
HCT: 28.1 % — ABNORMAL LOW (ref 39.0–52.0)
Hemoglobin: 10.6 g/dL — ABNORMAL LOW (ref 13.0–17.0)
Hemoglobin: 9.3 g/dL — ABNORMAL LOW (ref 13.0–17.0)
Immature Granulocytes: 1 %
Lymphocytes Relative: 77 %
Lymphocytes Relative: 66 %
Lymphs Abs: 63.8 10*3/uL — ABNORMAL HIGH (ref 0.7–4.0)
Lymphs Abs: 70.1 10*3/uL — ABNORMAL HIGH (ref 0.7–4.0)
MCH: 31.4 pg (ref 26.0–34.0)
MCH: 31.1 pg (ref 26.0–34.0)
MCHC: 32.2 g/dL (ref 30.0–36.0)
MCHC: 33.1 g/dL (ref 30.0–36.0)
MCV: 97.3 fL (ref 80.0–100.0)
MCV: 94 fL (ref 80.0–100.0)
Metamyelocytes Relative: 2 %
Monocytes Absolute: 2.5 10*3/uL — ABNORMAL HIGH (ref 0.1–1.0)
Monocytes Absolute: 9.6 10*3/uL — ABNORMAL HIGH (ref 0.1–1.0)
Monocytes Relative: 3 %
Monocytes Relative: 9 %
Neutro Abs: 14.9 10*3/uL — ABNORMAL HIGH (ref 1.7–7.7)
Neutro Abs: 26.6 10*3/uL — ABNORMAL HIGH (ref 1.7–7.7)
Neutrophils Relative %: 17 %
Neutrophils Relative %: 21 %
Platelets: 153 10*3/uL (ref 150–400)
Platelets: 143 10*3/uL — ABNORMAL LOW (ref 150–400)
RBC: 3.38 MIL/uL — ABNORMAL LOW (ref 4.22–5.81)
RBC: 2.99 MIL/uL — ABNORMAL LOW (ref 4.22–5.81)
RDW: 15.4 % (ref 11.5–15.5)
RDW: 15.4 % (ref 11.5–15.5)
WBC Morphology: ABNORMAL
WBC: 82.8 10*3/uL (ref 4.0–10.5)
WBC: 106.2 10*3/uL (ref 4.0–10.5)
nRBC: 0 % (ref 0.0–0.2)
nRBC: 0 % (ref 0.0–0.2)
nRBC: 0 /100 WBC

## 2020-09-13 LAB — ECHOCARDIOGRAM COMPLETE
AR max vel: 0.89 cm2
AV Area VTI: 0.9 cm2
AV Area mean vel: 0.93 cm2
AV Mean grad: 21 mmHg
AV Peak grad: 34.5 mmHg
Ao pk vel: 2.94 m/s
Area-P 1/2: 3.45 cm2
Height: 66 in
MV VTI: 1.68 cm2
S' Lateral: 2.63 cm
Weight: 2064 oz

## 2020-09-13 LAB — COMPREHENSIVE METABOLIC PANEL
ALT: 13 U/L (ref 0–44)
AST: 84 U/L — ABNORMAL HIGH (ref 15–41)
Albumin: 3.6 g/dL (ref 3.5–5.0)
Alkaline Phosphatase: 109 U/L (ref 38–126)
Anion gap: 9 (ref 5–15)
BUN: 26 mg/dL — ABNORMAL HIGH (ref 8–23)
CO2: 24 mmol/L (ref 22–32)
Calcium: 8.4 mg/dL — ABNORMAL LOW (ref 8.9–10.3)
Chloride: 104 mmol/L (ref 98–111)
Creatinine, Ser: 1.58 mg/dL — ABNORMAL HIGH (ref 0.61–1.24)
GFR, Estimated: 40 mL/min — ABNORMAL LOW (ref >60)
Glucose, Bld: 118 mg/dL — ABNORMAL HIGH (ref 70–99)
Potassium: 3.5 mmol/L (ref 3.5–5.1)
Sodium: 137 mmol/L (ref 135–145)
Total Bilirubin: 1 mg/dL (ref 0.3–1.2)
Total Protein: 6.6 g/dL (ref 6.5–8.1)

## 2020-09-13 LAB — SAVE SMEAR(SSMR), FOR PROVIDER SLIDE REVIEW

## 2020-09-13 LAB — TROPONIN I (HIGH SENSITIVITY)
Troponin I (High Sensitivity): 418 ng/L (ref <18)
Troponin I (High Sensitivity): 387 ng/L (ref <18)
Troponin I (High Sensitivity): 229 ng/L (ref <18)
Troponin I (High Sensitivity): 289 ng/L (ref <18)

## 2020-09-13 LAB — RESP PANEL BY RT-PCR (FLU A&B, COVID) ARPGX2
Influenza A by PCR: NEGATIVE
Influenza B by PCR: NEGATIVE
SARS Coronavirus 2 by RT PCR: NEGATIVE

## 2020-09-13 LAB — LACTATE DEHYDROGENASE: LDH: 194 U/L — ABNORMAL HIGH (ref 98–192)

## 2020-09-13 LAB — LIPID PANEL
Cholesterol: 91 mg/dL (ref 0–200)
HDL: 35 mg/dL — ABNORMAL LOW (ref >40)
LDL Cholesterol: 44 mg/dL (ref 0–99)
Total CHOL/HDL Ratio: 2.6 RATIO
Triglycerides: 61 mg/dL (ref <150)
VLDL: 12 mg/dL (ref 0–40)

## 2020-09-13 LAB — GLUCOSE, CAPILLARY
Glucose-Capillary: 124 mg/dL — ABNORMAL HIGH (ref 70–99)
Glucose-Capillary: 117 mg/dL — ABNORMAL HIGH (ref 70–99)

## 2020-09-13 LAB — BRAIN NATRIURETIC PEPTIDE: B Natriuretic Peptide: 165 pg/mL — ABNORMAL HIGH (ref 0.0–100.0)

## 2020-09-13 LAB — TSH: TSH: 1.72 u[IU]/mL (ref 0.350–4.500)

## 2020-09-13 MED ORDER — ONDANSETRON HCL 4 MG/2ML IJ SOLN: 4.0000 mg | Freq: Four times a day (QID) | INTRAMUSCULAR | Status: DC | PRN

## 2020-09-13 MED ORDER — FLUTICASONE PROPIONATE 50 MCG/ACT NA SUSP
1.0000 | Freq: Every day | NASAL | Status: DC
  Administered 2020-09-13 – 2020-09-14 (×3): 1 via NASAL
  Filled 2020-09-13: qty 16

## 2020-09-13 MED ORDER — IPRATROPIUM-ALBUTEROL 0.5-2.5 (3) MG/3ML IN SOLN: 3.0000 mL | Freq: Four times a day (QID) | RESPIRATORY_TRACT | Status: DC | PRN

## 2020-09-13 MED ORDER — ACETAMINOPHEN 650 MG RE SUPP: 650.0000 mg | Freq: Four times a day (QID) | RECTAL | Status: DC | PRN

## 2020-09-13 MED ORDER — INSULIN ASPART 100 UNIT/ML IJ SOLN: 0.0000 [IU] | Freq: Every day | INTRAMUSCULAR | Status: DC

## 2020-09-13 MED ORDER — SODIUM CHLORIDE 0.9 % IV SOLN: 250.0000 mL | INTRAVENOUS | Status: DC | PRN

## 2020-09-13 MED ORDER — ACETAMINOPHEN 325 MG PO TABS
650.0000 mg | ORAL_TABLET | Freq: Four times a day (QID) | ORAL | Status: DC | PRN
  Administered 2020-09-14: 650 mg via ORAL
  Filled 2020-09-13: qty 2

## 2020-09-13 MED ORDER — MIRTAZAPINE 7.5 MG PO TABS
7.5000 mg | ORAL_TABLET | Freq: Every evening | ORAL | Status: DC
  Administered 2020-09-13: 7.5 mg via ORAL
  Filled 2020-09-13 (×2): qty 1

## 2020-09-13 MED ORDER — ROPINIROLE HCL 0.5 MG PO TABS
0.2500 mg | ORAL_TABLET | Freq: Every day | ORAL | Status: DC
  Administered 2020-09-13: 0.5 mg via ORAL
  Filled 2020-09-13: qty 1

## 2020-09-13 MED ORDER — ASPIRIN 81 MG PO CHEW
324.0000 mg | CHEWABLE_TABLET | Freq: Once | ORAL | Status: AC
  Administered 2020-09-13: 324 mg via ORAL
  Filled 2020-09-13: qty 4

## 2020-09-13 MED ORDER — CLOPIDOGREL BISULFATE 75 MG PO TABS
75.0000 mg | ORAL_TABLET | Freq: Every day | ORAL | Status: DC
  Administered 2020-09-13 – 2020-09-14 (×2): 75 mg via ORAL
  Filled 2020-09-13 (×2): qty 1

## 2020-09-13 MED ORDER — ONDANSETRON HCL 4 MG PO TABS: 4.0000 mg | ORAL_TABLET | Freq: Four times a day (QID) | ORAL | Status: DC | PRN

## 2020-09-13 MED ORDER — CARBIDOPA-LEVODOPA 25-100 MG PO TABS
1.0000 | ORAL_TABLET | Freq: Four times a day (QID) | ORAL | Status: DC
Start: 2020-09-13 — End: 2020-09-14
  Administered 2020-09-13 – 2020-09-14 (×5): 1 via ORAL
  Filled 2020-09-13 (×8): qty 1

## 2020-09-13 MED ORDER — INSULIN ASPART 100 UNIT/ML IJ SOLN
0.0000 [IU] | Freq: Three times a day (TID) | INTRAMUSCULAR | Status: DC
  Administered 2020-09-14: 1 [IU] via SUBCUTANEOUS
  Administered 2020-09-14: 2 [IU] via SUBCUTANEOUS

## 2020-09-13 MED ORDER — PANTOPRAZOLE SODIUM 40 MG PO TBEC
40.0000 mg | DELAYED_RELEASE_TABLET | Freq: Every day | ORAL | Status: DC
  Administered 2020-09-13 – 2020-09-14 (×2): 40 mg via ORAL
  Filled 2020-09-13 (×2): qty 1

## 2020-09-13 MED ORDER — ATORVASTATIN CALCIUM 80 MG PO TABS
80.0000 mg | ORAL_TABLET | Freq: Every day | ORAL | Status: DC
  Administered 2020-09-13: 80 mg via ORAL
  Filled 2020-09-13 (×2): qty 1

## 2020-09-13 MED ORDER — FUROSEMIDE 10 MG/ML IJ SOLN
40.0000 mg | Freq: Once | INTRAMUSCULAR | Status: AC
  Administered 2020-09-13: 40 mg via INTRAVENOUS
  Filled 2020-09-13: qty 4

## 2020-09-13 MED ORDER — SODIUM CHLORIDE 0.9% FLUSH
3.0000 mL | Freq: Two times a day (BID) | INTRAVENOUS | Status: DC
  Administered 2020-09-13 – 2020-09-14 (×2): 3 mL via INTRAVENOUS

## 2020-09-13 MED ORDER — CARBIDOPA-LEVODOPA ER 25-100 MG PO TBCR
1.0000 | EXTENDED_RELEASE_TABLET | Freq: Four times a day (QID) | ORAL | Status: DC
  Administered 2020-09-13 – 2020-09-14 (×5): 1 via ORAL
  Filled 2020-09-13 (×8): qty 1

## 2020-09-13 MED ORDER — SODIUM CHLORIDE 0.9% FLUSH: 3.0000 mL | INTRAVENOUS | Status: DC | PRN

## 2020-09-13 MED ORDER — HEPARIN SODIUM (PORCINE) 5000 UNIT/ML IJ SOLN
5000.0000 [IU] | Freq: Three times a day (TID) | INTRAMUSCULAR | Status: DC
  Administered 2020-09-13 – 2020-09-14 (×3): 5000 [IU] via SUBCUTANEOUS
  Filled 2020-09-13 (×4): qty 1

## 2020-09-13 MED ORDER — TAMSULOSIN HCL 0.4 MG PO CAPS
0.4000 mg | ORAL_CAPSULE | Freq: Every day | ORAL | Status: DC
  Administered 2020-09-13 – 2020-09-14 (×2): 0.4 mg via ORAL
  Filled 2020-09-13 (×2): qty 1

## 2020-09-13 MED ORDER — BISACODYL 5 MG PO TBEC
5.0000 mg | DELAYED_RELEASE_TABLET | Freq: Every day | ORAL | Status: DC | PRN
  Administered 2020-09-13: 5 mg via ORAL
  Filled 2020-09-13: qty 1

## 2020-09-13 NOTE — Progress Notes (Signed)
Lab called with critical lab value of WBC of 106.2. Paged the hospitalist on call, Dr. Olevia Bowens

## 2020-09-13 NOTE — ED Notes (Signed)
On assessment pt noted with right pupil larger than other. States he has a hx of stroke with affects to right side that didn't fully resolve. EDP notified

## 2020-09-13 NOTE — Consult Note (Signed)
New Hematology/Oncology Consult   Requesting HW:KGSUPJ, Victor Bell        Reason for Consult: Leukocytosis  HPI: Victor Bell presented to the emergency room at [facility] earlier today with nausea, vomiting, and dyspnea.  Victor Bell denies chest pain.  He reports a fever and burning with urination.  He appears to be a poor historian.  No family is present this evening.  He was noted to have an elevated troponin and LDH.  The CBC was remarkable for a white count of 82.8, hemoglobin 10.6, platelets 153,000, absolute for site count 63.8, and absolute neutrophil count 14.9.  A CBC from 02/21/2017 found a white count of eight 7.3, hemoglobin 12, and platelets 170,000.  The absolute site count returned at 1.9 with an Kimball of 4.7.  Victor Bell was transferred to [facility] due to the possibility of a NSTEMI.     Past Medical History:  Diagnosis Date   Arthritis    Asthma    CAD (coronary artery disease)    Cancer ([place])    skin    Carotid artery occlusion    CHF (congestive heart failure) (HCC)    Colon polyp    COPD (chronic obstructive pulmonary disease) ([place])    Diabetes mellitus    GERD (gastroesophageal reflux disease)    Heart disease    Hyperlipidemia    Hypertension    Myocardial infarction ([place])    1998  :   Past Surgical History:  Procedure Laterality Date   CATARACT EXTRACTION     CHOLECYSTECTOMY     Gall bladder   CORONARY ARTERY BYPASS GRAFT  10/03/96   x7   TONSILLECTOMY    :   Current Facility-Administered Medications:    0.9 %  sodium chloride infusion, 250 mL, Intravenous, PRN, Barton Dubois, MD   acetaminophen (TYLENOL) tablet 650 mg, 650 mg, Oral, Q6H PRN **OR** acetaminophen (TYLENOL) suppository 650 mg, 650 mg, Rectal, Q6H PRN, Barton Dubois, MD   atorvastatin (LIPITOR) tablet 80 mg, 80 mg, Oral, q1800, Barton Dubois, MD, 80 mg at 09/13/20 1759   carbidopa-levodopa (SINEMET IR) 25-100 MG per tablet immediate release 1 tablet, 1 tablet, Oral,  QID, Kommor, [place], MD, 1 tablet at 09/13/20 1800   Carbidopa-Levodopa ER (SINEMET CR) 25-100 MG tablet controlled release 1 tablet, 1 tablet, Oral, QID, Barton Dubois, MD, 1 tablet at 09/13/20 1800   clopidogrel (PLAVIX) tablet 75 mg, 75 mg, Oral, Daily, Barton Dubois, MD, 75 mg at 09/13/20 1447   fluticasone (FLONASE) 50 MCG/ACT nasal spray 1 spray, 1 spray, Each Nare, Daily, Barton Dubois, MD, 1 spray at 09/13/20 1759   heparin injection 5,000 Units, 5,000 Units, Subcutaneous, Q8H, Barton Dubois, MD, 5,000 Units at 09/13/20 1446   insulin aspart (novoLOG) injection 0-5 Units, 0-5 Units, Subcutaneous, QHS, Barton Dubois, MD   Derrill Memo ON 09/14/2020] insulin aspart (novoLOG) injection 0-9 Units, 0-9 Units, Subcutaneous, TID WC, Barton Dubois, MD   ipratropium-albuterol (DUONEB) 0.5-2.5 (3) MG/3ML nebulizer solution 3 mL, 3 mL, Nebulization, Q6H PRN, Barton Dubois, MD   mirtazapine (REMERON) tablet 7.5 mg, 7.5 mg, Oral, QPM, Barton Dubois, MD, 7.5 mg at 09/13/20 1800   ondansetron (ZOFRAN) tablet 4 mg, 4 mg, Oral, Q6H PRN **OR** ondansetron (ZOFRAN) injection 4 mg, 4 mg, Intravenous, Q6H PRN, Barton Dubois, MD   pantoprazole (PROTONIX) EC tablet 40 mg, 40 mg, Oral, Daily, Barton Dubois, MD, 40 mg at 09/13/20 1801   rOPINIRole (REQUIP) tablet 0.25-0.5 mg, 0.25-0.5 mg, Oral, QHS, Barton Dubois, MD  sodium chloride flush (NS) 0.9 % injection 3 mL, 3 mL, Intravenous, Q12H, Barton Dubois, MD   sodium chloride flush (NS) 0.9 % injection 3 mL, 3 mL, Intravenous, PRN, Barton Dubois, MD   tamsulosin [facility]) capsule 0.4 mg, 0.4 mg, Oral, Daily, Barton Dubois, MD, 0.4 mg at 09/13/20 1759:   atorvastatin  80 mg Oral q1800   carbidopa-levodopa  1 tablet Oral QID   Carbidopa-Levodopa ER  1 tablet Oral QID   clopidogrel  75 mg Oral Daily   fluticasone  1 spray Each Nare Daily   heparin injection (subcutaneous)  5,000 Units Subcutaneous Q8H   insulin aspart  0-5 Units Subcutaneous QHS   [START  ON 09/14/2020] insulin aspart  0-9 Units Subcutaneous TID WC   mirtazapine  7.5 mg Oral QPM   pantoprazole  40 mg Oral Daily   rOPINIRole  0.25-0.5 mg Oral QHS   sodium chloride flush  3 mL Intravenous Q12H   tamsulosin  0.4 mg Oral Daily  :   Allergies  Allergen Reactions   Amoxicillin Nausea And Vomiting    With high dosing   Niacin And Related Other (See Comments)    Burning   :  FH: No family history of cancer.  SOCIAL HISTORY: He lives alone in [place].  His wife died approxi-1 month ago.  He is retired from a [employer].  He does not use cigarettes or alcohol.  Risk factor for HIV or hepatitis  Review of Systems:   Positives include: Burning with urination, constipation, hemorrhoids, ataxia, anorexia, 40 pound weight loss  A complete ROS was otherwise negative.   Physical Exam:  Blood pressure (!) 90/55, pulse 82, temperature 97.8 F (36.6 C), temperature source Oral, resp. rate 18, height 5\' 6"  (1.676 m), weight 125 lb 14.1 oz (57.1 kg), SpO2 95 %.  HEENT: No thrush Lungs: Clear bilaterally Cardiac: Regular rate and rhythm, 2/6 systolic murmur Abdomen: No hepatosplenomegaly, no mass, nontender Rectal: External exam reveals soft hemorrhoids Vascular: No leg edema Lymph nodes: No cervical, supraclavicular, axillary, or inguinal nodes Neurologic: Alert, follows commands, moves all extremities to command Skin: Multiple keratoses over the scalp Musculoskeletal: No spine tenderness  LABS:   Recent Labs    09/13/20 0755  WBC 82.8*  HGB 10.6*  HCT 32.9*  PLT 153     Recent Labs    09/13/20 0755  NA 137  K 3.5  CL 104  CO2 24  GLUCOSE 118*  BUN 26*  CREATININE 1.58*  CALCIUM 8.4*      RADIOLOGY:  DG Chest 2 View  Result Date: 09/13/2020 CLINICAL DATA:  Dyspnea EXAM: CHEST - 2 VIEW COMPARISON:  None FINDINGS: Borderline enlarged cardiac silhouette. Prior median sternotomy. Aortic arch calcifications. There are bilateral interstitial and  airspace opacities, right greater than left. No large pleural effusion. No visible pneumothorax. Bilateral shoulder degenerative changes. No acute osseous abnormality. IMPRESSION: Diffuse interstitial and right greater than left airspace opacities, could represent multifocal pneumonia or asymmetric pulmonary edema. Electronically Signed   By: Maurine Simmering M.D.   On: 09/13/2020 09:00   CT HEAD WO CONTRAST (5MM)  Result Date: 09/13/2020 CLINICAL DATA:  Recent falls.  Nausea EXAM: CT HEAD WITHOUT CONTRAST TECHNIQUE: Contiguous axial images were obtained from the base of the skull through the vertex without intravenous contrast. COMPARISON:  02/21/2017 FINDINGS: Brain: Small bilateral cerebellar infarcts have occurred since prior. Chronic small vessel ischemic gliosis in the supratentorial white matter. No acute hemorrhage, hydrocephalus, or visible acute infarct. Vascular:  No hyperdense vessel or unexpected calcification. Skull: Normal. Negative for fracture or focal lesion. Sinuses/Orbits: No acute finding. IMPRESSION: 1. No acute finding. 2. Small chronic cerebellar infarcts which have occurred since 2019. Electronically Signed   By: Monte Fantasia M.D.   On: 09/13/2020 09:13   ECHOCARDIOGRAM COMPLETE  Result Date: 09/13/2020    ECHOCARDIOGRAM REPORT   Patient Name:   Victor Bell Date of Exam: 09/13/2020 Medical Rec #:  [id number]      Height:       66.0 in Accession #:    [id number]     Weight:       129.0 lb Date of Birth:  1922/04/16       BSA:          1.660 m Patient Age:    58 years       BP:           101/62 mmHg Patient Gender: M              HR:           75 bpm. Exam Location:  [facility] Procedure: 2D Echo, Cardiac Doppler and Color Doppler Indications:    Elevated Troponin  History:        Patient has prior history of Echocardiogram examinations, most                 recent 02/22/2017. Stroke, Signs/Symptoms:Shortness of Breath;                 Risk Factors:Hypertension and Dyslipidemia.   Sonographer:    Wenda Low Referring Phys: [place]  1. Left ventricular ejection fraction, by estimation, is 70 to 75%. The left ventricle has hyperdynamic function. The left ventricle has no regional wall motion abnormalities. There is moderate left ventricular hypertrophy. Left ventricular diastolic parameters are consistent with Grade I diastolic dysfunction (impaired relaxation).  2. Right ventricular systolic function is normal. The right ventricular size is normal. There is normal pulmonary artery systolic pressure. The estimated right ventricular systolic pressure is 76.5 mmHg.  3. Left atrial size was upper normal.  4. The mitral valve is degenerative. Mild mitral valve regurgitation. No evidence of mitral stenosis. The mean mitral valve gradient is 2.0 mmHg.  5. The aortic valve has an indeterminant number of cusps. There is moderate calcification of the aortic valve. Aortic valve regurgitation is trivial. Moderate to severe, paradoxically low gradient aortic valve stenosis. Aortic valve mean gradient measures 21.0 mmHg. Aortic valve Vmax measures 2.94 m/s. Dimentionless index 0.29.  6. The inferior vena cava is normal in size with greater than 50% respiratory variability, suggesting right atrial pressure of 3 mmHg. Comparison(s): Prior images unable to be directly viewed, comparison made by report only. FINDINGS  Left Ventricle: Left ventricular ejection fraction, by estimation, is 70 to 75%. The left ventricle has hyperdynamic function. The left ventricle has no regional wall motion abnormalities. The left ventricular internal cavity size was normal in size. There is moderate left ventricular hypertrophy. Left ventricular diastolic parameters are consistent with Grade I diastolic dysfunction (impaired relaxation). Right Ventricle: The right ventricular size is normal. No increase in right ventricular wall thickness. Right ventricular systolic function is normal. There is  normal pulmonary artery systolic pressure. The tricuspid regurgitant velocity is 1.73 m/s, and  with an assumed right atrial pressure of 3 mmHg, the estimated right ventricular systolic pressure is 46.5 mmHg. Left Atrium: Left atrial size was upper normal. Right Atrium: Right atrial size  was normal in size. Pericardium: There is no evidence of pericardial effusion. Mitral Valve: The mitral valve is degenerative in appearance. There is mild thickening of the mitral valve leaflet(s). There is mild calcification of the mitral valve leaflet(s). Mild mitral annular calcification. Mild mitral valve regurgitation. No evidence of mitral valve stenosis. MV peak gradient, 5.1 mmHg. The mean mitral valve gradient is 2.0 mmHg. Tricuspid Valve: The tricuspid valve is grossly normal. Tricuspid valve regurgitation is trivial. Aortic Valve: The aortic valve has an indeterminant number of cusps. There is moderate calcification of the aortic valve. There is mild aortic valve annular calcification. Aortic valve regurgitation is trivial. Moderate to severe aortic stenosis is present. Aortic valve mean gradient measures 21.0 mmHg. Aortic valve peak gradient measures 34.5 mmHg. Aortic valve area, by VTI measures 0.90 cm. Pulmonic Valve: The pulmonic valve was grossly normal. Pulmonic valve regurgitation is trivial. Aorta: The aortic root is normal in size and structure. Venous: The inferior vena cava is normal in size with greater than 50% respiratory variability, suggesting right atrial pressure of 3 mmHg. IAS/Shunts: The interatrial septum appears to be lipomatous. No atrial level shunt detected by color flow Doppler.  LEFT VENTRICLE PLAX 2D LVIDd:         4.55 cm  Diastology LVIDs:         2.63 cm  LV e' medial:    6.13 cm/s LV PW:         1.35 cm  LV E/e' medial:  11.6 LV IVS:        1.37 cm  LV e' lateral:   9.58 cm/s LVOT diam:     2.00 cm  LV E/e' lateral: 7.4 LV SV:         53 LV SV Index:   32 LVOT Area:     3.14 cm  RIGHT  VENTRICLE RV Basal diam:  4.26 cm RV Mid diam:    3.95 cm RV S prime:     6.42 cm/s TAPSE (M-mode): 1.6 cm LEFT ATRIUM             Index       RIGHT ATRIUM           Index LA diam:        4.40 cm 2.65 cm/m  RA Area:     13.80 cm LA Vol (A2C):   58.1 ml 35.00 ml/m RA Volume:   29.40 ml  17.71 ml/m LA Vol (A4C):   45.9 ml 27.65 ml/m LA Biplane Vol: 52.3 ml 31.51 ml/m  AORTIC VALVE AV Area (Vmax):    0.89 cm AV Area (Vmean):   0.93 cm AV Area (VTI):     0.90 cm AV Vmax:           293.50 cm/s AV Vmean:          193.000 cm/s AV VTI:            0.587 m AV Peak Grad:      34.5 mmHg AV Mean Grad:      21.0 mmHg LVOT Vmax:         83.30 cm/s LVOT Vmean:        57.400 cm/s LVOT VTI:          0.169 m LVOT/AV VTI ratio: 0.29  AORTA Ao Root diam: 3.00 cm Ao Asc diam:  3.10 cm MITRAL VALVE               TRICUSPID VALVE MV Area (PHT): 3.45 cm  TR Peak grad:   12.0 mmHg MV Area VTI:   1.68 cm    TR Vmax:        173.00 cm/s MV Peak grad:  5.1 mmHg MV Mean grad:  2.0 mmHg    SHUNTS MV Vmax:       1.13 m/s    Systemic VTI:  0.17 m MV Vmean:      55.4 cm/s   Systemic Diam: 2.00 cm MV Decel Time: 220 msec MV E velocity: 71.10 cm/s MV A velocity: 60.27 cm/s MV E/A ratio:  1.18 Rozann Lesches MD Electronically signed by Rozann Lesches MD Signature Date/Time: 09/13/2020/1:16:29 PM    Final     Assessment and Plan:  Leukocytosis with lymphocyte predominance Anemia 3.   Elevated troponin 4.   Nausea 5.   Hypoxia with bilateral airspace opacities 6.   History of CAD 7.   Diabetes 8.  Parkinson's 9.  BPH 10.  History of a CVA 11.  History of gastroesophageal reflux disease 12.  Chronic renal failure   Victor Bell is a 87 year old with multiple medical conditions.  He is admitted with nausea and a possible NSTEMI.  He is found to have marked leukocytosis/lymphocytosis.  The most likely diagnosis is chronic lymphocytic leukemia which is probably unrelated to the current admission.  The CBC was drawn at [facility]  and there is no blood available for a smear at [facility].  We will check a CBC tonight and I will review the peripheral blood smear in the early a.m. on 09/14/2020.  Recommendations: CBC with blood smear for review in the a.m. 09/14/2020 and send the peripheral blood for flow cytometry Evaluation of elevated troponin per cardiology Management of hypoxia and respiratory infiltrates per the medical service Hematology will follow him while here and outpatient follow-up will be arranged with Dr. Delton Coombes at [facility] report of anorexia/weight loss with his daughter  Betsy Coder, MD 09/13/2020, 7:25 PM

## 2020-09-13 NOTE — H&P (Signed)
History and Physical    [facility] CWC:[id number] DOB: Oct 19, 1922 DOA: 09/13/2020  PCP: Glenda Chroman, MD   Patient coming from: Home  I have personally briefly reviewed patient's old medical records in [place]  Chief Complaint: Nausea, vomiting, shortness of breath, weakness and increased fatigue.  HPI: [facility] is a 87 y.o. male with medical history significant of history of asthma/COPD, chronic diastolic heart failure, gastroesophageal flux disease, hypertension, hyperlipidemia, nonhemorrhagic stroke with left residual deficits, coronary disease a status post CABG and type 2 diabetes mellitus with nephropathy (chronic kidney disease stage IIIb); who [facility] secondary to shortness of breath, nausea, vomiting and midepigastric discomfort.  Patient symptoms have been present for the last 2 days and worsening; mild midepigastric discomfort radiated into his mid chest, intermittent, 3/10 in intensity, associated with nausea/vomiting (nonbloody content).  Patient denies radiation to the base of his neck or arms and expressed no palpitations.  Patient has noticed shortness of breath worse with exertion.  Patient reports no sick contacts, no hematemesis, no melena, no hematochezia, no dysuria, no hematuria and not new focal deficits.  COVID PCR negative in the ED.  ED Course: Chest x-ray with vascular congestion/early interstitial changes, elevated BNP, CBC demonstrating white blood cells in the 82K range, elevated troponin, creatinine of 1.58, no acute ischemic changes on EKG.  Case discussed with cardiology service who recommended internal medicine admission at [facility] and they will follow-up.  Will cycle troponin, no recommendation for heparin drip.  Continue aspirin Plavix and statins.  Oncology service will also see patient and has recommended respite support work to be included.  Review of Systems: As per HPI otherwise all other systems reviewed and are  negative.   Past Medical History:  Diagnosis Date   Arthritis    Asthma    CAD (coronary artery disease)    Cancer ([place])    skin    Carotid artery occlusion    CHF (congestive heart failure) (H[facility])    Colon polyp    COPD (chronic obstructive pulmonary disease) ([place])    Diabetes mellitus    GERD (gastroesophageal reflux disease)    Heart disease    Hyperlipidemia    Hypertension    Myocardial infarction ([place])    1998    Past Surgical History:  Procedure Laterality Date   CATARACT EXTRACTION     CHOLECYSTECTOMY     Gall bladder   CORONARY ARTERY BYPASS GRAFT  10/03/96   x7   TONSILLECTOMY      Social History  reports that he has never smoked. He has never used smokeless tobacco. He reports that he does not drink alcohol and does not use drugs.  Allergies  Allergen Reactions   Amoxicillin Nausea And Vomiting    With high dosing   Niacin And Related Other (See Comments)    Burning     Family History  Problem Relation Age of Onset   Heart disease Mother    Aneurysm Sister     Prior to Admission medications   Medication Sig Start Date End Date Taking? Authorizing Provider  atorvastatin (LIPITOR) 80 MG tablet Take 80 mg by mouth daily.     Yes [provider]  carbidopa-levodopa (SINEMET IR) 25-100 MG tablet Take 1 tablet by mouth 4 (four) times daily. Pt takes carbidopa-levodopa extended release and other at same time. 09/07/20  Yes Lomax, Amy, NP  Carbidopa-Levodopa ER (SINEMET CR) 25-100 MG tablet controlled release Take 1 tablet by mouth 4 (  four) times daily. 09/07/20  Yes Lomax, Amy, NP  cetirizine (ZYRTEC) 10 MG tablet Take 10 mg by mouth daily.   Yes [provider]  clopidogrel (PLAVIX) 75 MG tablet Take 1 tablet (75 mg total) by mouth daily. 02/24/17  Yes Velvet Bathe, MD  dutasteride (AVODART) 0.5 MG capsule Take 0.5 mg by mouth daily.     Yes [provider]  finasteride (PROSCAR) 5 MG tablet Take 5 mg by mouth daily.  02/16/15  Yes  [provider]  fluticasone (CUTIVATE) 0.05 % cream Apply topically 2 (two) times daily as needed. 06/12/20  Yes [provider]  fluticasone (FLONASE) 50 MCG/ACT nasal spray Place 1 spray into both nostrils daily. 10/16/18  Yes [provider]  metFORMIN (GLUCOPHAGE) 500 MG tablet Take 500 mg by mouth daily.    Yes [provider]  mirtazapine (REMERON) 7.5 MG tablet Take 7.5 mg by mouth every evening. 08/14/17  Yes [provider]  Multiple Vitamin (MULTIVITAMIN) tablet Take 1 tablet by mouth daily.   Yes [provider]  nabumetone (RELAFEN) 500 MG tablet Take 500 mg by mouth 2 (two) times daily.     Yes [provider]  omeprazole (PRILOSEC) 40 MG capsule Take 40 mg by mouth daily.  01/28/15  Yes [provider]  rOPINIRole (REQUIP) 0.25 MG tablet Take 1-2 tablets (0.25-0.5 mg total) by mouth at bedtime. 08/03/20  Yes Melvenia Beam, MD  Tamsulosin HCl (FLOMAX) 0.4 MG CAPS Take 0.4 mg by mouth daily.     Yes [provider]  traMADol (ULTRAM) 50 MG tablet Take 50 mg by mouth every 6 (six) hours as needed. 08/01/17  Yes [provider]  mirtazapine (REMERON) 15 MG tablet Take 15 mg by mouth at bedtime. Patient not taking: No sig reported 07/30/20   [provider]  polyethylene glycol powder (GLYCOLAX/MIRALAX) powder Take 1 Container by mouth daily.  Patient not taking: No sig reported 01/28/15   [provider]    Physical Exam: Vitals:   09/13/20 1245 09/13/20 1400 09/13/20 1500 09/13/20 1630  BP:  (!) 91/49 98/60   Pulse: 83 77 79   Resp: 13 13    Temp:   (!) 97.4 F (36.3 C) 97.8 F (36.6 C)  TempSrc:   Oral Oral  SpO2: 96% 94% 95%   Weight:   57.1 kg   Height:        Constitutional: After Lasix given feeling better and breathing easier; no chest pain currently.  1 L oxygen supplementation in place. Vitals:   09/13/20 1245 09/13/20 1400 09/13/20 1500 09/13/20 1630  BP:  (!)  91/49 98/60   Pulse: 83 77 79   Resp: 13 13    Temp:   (!) 97.4 F (36.3 C) 97.8 F (36.6 C)  TempSrc:   Oral Oral  SpO2: 96% 94% 95%   Weight:   57.1 kg   Height:       Eyes: PERRL, lids and conjunctivae normal, no icterus, no nystagmus. ENMT: Mucous membranes are moist. Posterior pharynx clear of any exudate or lesions. Neck: normal, supple, no masses, no thyromegaly, no JVD. Respiratory: No using accessory muscles; decreased breath sounds at the bases, no wheezing, no using accessory muscles. Cardiovascular: Rate controlled, no rubs, no gallops, positive soft systolic ejection murmur. Abdomen: no tenderness, no masses palpated. No hepatosplenomegaly. Bowel sounds positive.  Musculoskeletal: no clubbing / cyanosis.  Trace edema seen. Skin: no rashes, no petechiae. Neurologic: CN 2-12 grossly  intact. No new focal deficits. Psychiatric: Normal judgment and insight. Alert and oriented x 3. Normal mood.   Labs on Admission: I have personally reviewed following labs and imaging studies  CBC: Recent Labs  Lab 09/13/20 0755  WBC 82.8*  NEUTROABS 14.9*  HGB 10.6*  HCT 32.9*  MCV 97.3  PLT 637    Basic Metabolic Panel: Recent Labs  Lab 09/13/20 0755  NA 137  K 3.5  CL 104  CO2 24  GLUCOSE 118*  BUN 26*  CREATININE 1.58*  CALCIUM 8.4*    GFR: Estimated Creatinine Clearance: 22.1 mL/min (A) (by C-G formula based on SCr of 1.58 mg/dL (H)).  Liver Function Tests: Recent Labs  Lab 09/13/20 0755  AST 84*  ALT 13  ALKPHOS 109  BILITOT 1.0  PROT 6.6  ALBUMIN 3.6    Urine analysis:    Component Value Date/Time   COLORURINE STRAW (A) 02/21/2017 1349   APPEARANCEUR CLEAR 02/21/2017 1349   LABSPEC 1.032 (H) 02/21/2017 1349   PHURINE 6.0 02/21/2017 1349   GLUCOSEU NEGATIVE 02/21/2017 1349   HGBUR NEGATIVE 02/21/2017 1349   BILIRUBINUR NEGATIVE 02/21/2017 1349   KETONESUR NEGATIVE 02/21/2017 1349   PROTEINUR NEGATIVE 02/21/2017 1349   NITRITE NEGATIVE  02/21/2017 1349   LEUKOCYTESUR NEGATIVE 02/21/2017 1349    Radiological Exams on Admission: DG Chest 2 View  Result Date: 09/13/2020 CLINICAL DATA:  Dyspnea EXAM: CHEST - 2 VIEW COMPARISON:  None FINDINGS: Borderline enlarged cardiac silhouette. Prior median sternotomy. Aortic arch calcifications. There are bilateral interstitial and airspace opacities, right greater than left. No large pleural effusion. No visible pneumothorax. Bilateral shoulder degenerative changes. No acute osseous abnormality. IMPRESSION: Diffuse interstitial and right greater than left airspace opacities, could represent multifocal pneumonia or asymmetric pulmonary edema. Electronically Signed   By: Maurine Simmering M.D.   On: 09/13/2020 09:00   CT HEAD WO CONTRAST (5MM)  Result Date: 09/13/2020 CLINICAL DATA:  Recent falls.  Nausea EXAM: CT HEAD WITHOUT CONTRAST TECHNIQUE: Contiguous axial images were obtained from the base of the skull through the vertex without intravenous contrast. COMPARISON:  02/21/2017 FINDINGS: Brain: Small bilateral cerebellar infarcts have occurred since prior. Chronic small vessel ischemic gliosis in the supratentorial white matter. No acute hemorrhage, hydrocephalus, or visible acute infarct. Vascular: No hyperdense vessel or unexpected calcification. Skull: Normal. Negative for fracture or focal lesion. Sinuses/Orbits: No acute finding. IMPRESSION: 1. No acute finding. 2. Small chronic cerebellar infarcts which have occurred since 2019. Electronically Signed   By: Monte Fantasia M.D.   On: 09/13/2020 09:13   ECHOCARDIOGRAM COMPLETE  Result Date: 09/13/2020    ECHOCARDIOGRAM REPORT   Patient Name:   MCDANIEL OHMS Date of Exam: 09/13/2020 Medical Rec #:  [id number]      Height:       66.0 in Accession #:    [id number]     Weight:       129.0 lb Date of Birth:  [birth year]       BSA:          1.660 m Patient Age:    56 years       BP:           101/62 mmHg Patient Gender: M              HR:           75 bpm.  Exam Location:  [facility] Procedure: 2D Echo, Cardiac Doppler and Color Doppler Indications:    Elevated Troponin  History:  Patient has prior history of Echocardiogram examinations, most                 recent 02/22/2017. Stroke, Signs/Symptoms:Shortness of Breath;                 Risk Factors:Hypertension and Dyslipidemia.  Sonographer:    Wenda Low Referring Phys: [place]  1. Left ventricular ejection fraction, by estimation, is 70 to 75%. The left ventricle has hyperdynamic function. The left ventricle has no regional wall motion abnormalities. There is moderate left ventricular hypertrophy. Left ventricular diastolic parameters are consistent with Grade I diastolic dysfunction (impaired relaxation).  2. Right ventricular systolic function is normal. The right ventricular size is normal. There is normal pulmonary artery systolic pressure. The estimated right ventricular systolic pressure is 06.2 mmHg.  3. Left atrial size was upper normal.  4. The mitral valve is degenerative. Mild mitral valve regurgitation. No evidence of mitral stenosis. The mean mitral valve gradient is 2.0 mmHg.  5. The aortic valve has an indeterminant number of cusps. There is moderate calcification of the aortic valve. Aortic valve regurgitation is trivial. Moderate to severe, paradoxically low gradient aortic valve stenosis. Aortic valve mean gradient measures 21.0 mmHg. Aortic valve Vmax measures 2.94 m/s. Dimentionless index 0.29.  6. The inferior vena cava is normal in size with greater than 50% respiratory variability, suggesting right atrial pressure of 3 mmHg. Comparison(s): Prior images unable to be directly viewed, comparison made by report only. FINDINGS  Left Ventricle: Left ventricular ejection fraction, by estimation, is 70 to 75%. The left ventricle has hyperdynamic function. The left ventricle has no regional wall motion abnormalities. The left ventricular internal cavity size was normal  in size. There is moderate left ventricular hypertrophy. Left ventricular diastolic parameters are consistent with Grade I diastolic dysfunction (impaired relaxation). Right Ventricle: The right ventricular size is normal. No increase in right ventricular wall thickness. Right ventricular systolic function is normal. There is normal pulmonary artery systolic pressure. The tricuspid regurgitant velocity is 1.73 m/s, and  with an assumed right atrial pressure of 3 mmHg, the estimated right ventricular systolic pressure is 69.4 mmHg. Left Atrium: Left atrial size was upper normal. Right Atrium: Right atrial size was normal in size. Pericardium: There is no evidence of pericardial effusion. Mitral Valve: The mitral valve is degenerative in appearance. There is mild thickening of the mitral valve leaflet(s). There is mild calcification of the mitral valve leaflet(s). Mild mitral annular calcification. Mild mitral valve regurgitation. No evidence of mitral valve stenosis. MV peak gradient, 5.1 mmHg. The mean mitral valve gradient is 2.0 mmHg. Tricuspid Valve: The tricuspid valve is grossly normal. Tricuspid valve regurgitation is trivial. Aortic Valve: The aortic valve has an indeterminant number of cusps. There is moderate calcification of the aortic valve. There is mild aortic valve annular calcification. Aortic valve regurgitation is trivial. Moderate to severe aortic stenosis is present. Aortic valve mean gradient measures 21.0 mmHg. Aortic valve peak gradient measures 34.5 mmHg. Aortic valve area, by VTI measures 0.90 cm. Pulmonic Valve: The pulmonic valve was grossly normal. Pulmonic valve regurgitation is trivial. Aorta: The aortic root is normal in size and structure. Venous: The inferior vena cava is normal in size with greater than 50% respiratory variability, suggesting right atrial pressure of 3 mmHg. IAS/Shunts: The interatrial septum appears to be lipomatous. No atrial level shunt detected by color flow  Doppler.  LEFT VENTRICLE PLAX 2D LVIDd:         4.55  cm  Diastology LVIDs:         2.63 cm  LV e' medial:    6.13 cm/s LV PW:         1.35 cm  LV E/e' medial:  11.6 LV IVS:        1.37 cm  LV e' lateral:   9.58 cm/s LVOT diam:     2.00 cm  LV E/e' lateral: 7.4 LV SV:         53 LV SV Index:   32 LVOT Area:     3.14 cm  RIGHT VENTRICLE RV Basal diam:  4.26 cm RV Mid diam:    3.95 cm RV S prime:     6.42 cm/s TAPSE (M-mode): 1.6 cm LEFT ATRIUM             Index       RIGHT ATRIUM           Index LA diam:        4.40 cm 2.65 cm/m  RA Area:     13.80 cm LA Vol (A2C):   58.1 ml 35.00 ml/m RA Volume:   29.40 ml  17.71 ml/m LA Vol (A4C):   45.9 ml 27.65 ml/m LA Biplane Vol: 52.3 ml 31.51 ml/m  AORTIC VALVE AV Area (Vmax):    0.89 cm AV Area (Vmean):   0.93 cm AV Area (VTI):     0.90 cm AV Vmax:           293.50 cm/s AV Vmean:          193.000 cm/s AV VTI:            0.587 m AV Peak Grad:      34.5 mmHg AV Mean Grad:      21.0 mmHg LVOT Vmax:         83.30 cm/s LVOT Vmean:        57.400 cm/s LVOT VTI:          0.169 m LVOT/AV VTI ratio: 0.29  AORTA Ao Root diam: 3.00 cm Ao Asc diam:  3.10 cm MITRAL VALVE               TRICUSPID VALVE MV Area (PHT): 3.45 cm    TR Peak grad:   12.0 mmHg MV Area VTI:   1.68 cm    TR Vmax:        173.00 cm/s MV Peak grad:  5.1 mmHg MV Mean grad:  2.0 mmHg    SHUNTS MV Vmax:       1.13 m/s    Systemic VTI:  0.17 m MV Vmean:      55.4 cm/s   Systemic Diam: 2.00 cm MV Decel Time: 220 msec MV E velocity: 71.10 cm/s MV A velocity: 60.27 cm/s MV E/A ratio:  1.18 Rozann Lesches MD Electronically signed by Rozann Lesches MD Signature Date/Time: 09/13/2020/1:16:29 PM    Final     EKG: Independently reviewed.  No acute ischemic changes appreciated.  Sinus rhythm.  Assessment/Plan 1-acute respiratory failure with hypoxia -With concerns for type II NSTEMI/demand ischemia and vascular congestion. -Blood pressure soft but is stable; Lasix x1 given -2D echo has been ordered -After  discussing with cardiology service no heparin drip indicated -Cycle troponin, aspirin x1 given. -Continue statin and Plavix -Will check A1c, lipid panel and thyroid function -Provide as needed oxygen supplementation.  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  3-COPD -No wheezing currently -Oxygen supplementation as mentioned above -Continue as needed bronchodilators. -Continue Flonase twice a day.  4-type 2 diabetes mellitus with nephropathy -Holding oral hypoglycemic agents -Sliding scale insulin has been initiated -Follow CBGs and adjust regimen as needed.  5-chronic kidney disease a stage IIIb -Slightly increase in patient's creatinine level; but comparison is from labs 3 years ago (so not clear if this is chronic progression or acute decline). -Minimize the use of nephrotoxic agents -Patient advised to maintain adequate oral hydration -Repeat basic metabolic panel to follow renal function trend.  6-gastroesophageal flux disease -Continue PPI.  7-history of BPH -Continue Flomax  8-history of Parkinson disease -Continue the use of Sinemet  9-hyperlipidemia -Continue statins.  10-hx of RLS: -continue requip.  98-XQJJHER diastolic heart failure -Early decompensation presumed, given elevated BNP and vascular congestion seen on CXR -No Koichi crackles on examination. -Lasix given -Will follow daily weights and strict I's and O's. -Follow 2D echo results.  12-history of nonhemorrhagic stroke with left residual deficit -Unchanged -No new complaints -continue statins and plavix   DVT prophylaxis: Heparin Code Status:  Full code Family Communication:  Daughter at bedside. Disposition Plan:   Patient is from:  Home  Anticipated DC to:  To be determined  Anticipated DC  date:  09/16/20  Anticipated DC barriers: ACS rule out/treatment and further work-up for new diagnosis of CLL.  Consults called:  Emergency department contacted cardiology service and oncology service (recommendations were to admit at [facility] by internal medicine and they will see patient in consultation).  Palliative care Admission status:  Progressive unit, inpatient, length of stay more than 2 midnights.  Severity of Illness: The appropriate patient status for this patient is INPATIENT. Inpatient status is judged to be reasonable and necessary in order to provide the required intensity of service to ensure the patient's safety. The patient's presenting symptoms, physical exam findings, and initial radiographic and laboratory data in the context of their chronic comorbidities is felt to place them at high risk for further clinical deterioration. Furthermore, it is not anticipated that the patient will be medically stable for discharge from the [facility] within 2 midnights of admission. The following factors support the patient status of inpatient.   " The patient's presenting symptoms include shortness of breath, weakness, nausea, vomiting and mid epigastric discomfort intermittently. " The worrisome physical exam findings include mild hypoxia and tachypnea. " The initial radiographic and laboratory data are worrisome because of chest x-ray demonstrating vascular congestion with concern for interstitial edema; elevated troponin, elevated BNP and new findings concerning for CLL; also new requirement of oxygen supplementation.. " The chronic co-morbidities include history of coronary artery disease, hypertension, COPD, hyperlipidemia, history of CABG, type 2 diabetes mellitus with CKD stage 3b.   * I certify that at the point of admission it is my clinical judgment that the patient will require inpatient [facility] care spanning beyond 2 midnights from the point of admission due to high  intensity of service, high risk for further deterioration and high frequency of surveillance required.Barton Dubois MD [facility]  How to contact the [facility] Attending or Consulting provider [place] or covering provider during after hours [place], for this patient?   Check the care team in [facility] and look for a) attending/consulting [facility] provider listed and b) the [facility] team listed Log into [email or web address] and use [place]'s universal password  to access. If you do not have the password, please contact the [facility] operator. Locate the [facility] provider you are looking for under [facility] and page to a number that you can be directly reached. If you still have difficulty reaching the provider, please page the [facility] (Director on Call) for the Hospitalists listed on amion for assistance.  09/13/2020, 4:36 PM

## 2020-09-13 NOTE — ED Triage Notes (Signed)
Pt arrives via [facility] with c/o nausea and dry heaves starting yesterday around 5pm, but no vomiting. This got worse around 1am this morning. EMS also noted slight hypotension on arrival with BP 99/60. Hx of stroke with right side weakness at baseline

## 2020-09-13 NOTE — Progress Notes (Signed)
[facility] night shift PCU coverage note.  The nursing staff notified me that the patient's recently drawn CBC showed a WBC of 106.2.  Hematology has seen the patient and there consult was appreciated.  It seems he has CLL.  The patient also requested something for constipation.  Dulcolax 5 mg p.o. daily as needed was ordered.  Tennis Must, MD.

## 2020-09-13 NOTE — ED Provider Notes (Addendum)
[facility] EMERGENCY DEPARTMENT Provider Note   CSN: [id number] Arrival date & time: 09/13/20  4098     History Chief Complaint  Patient presents with   Nausea    Deforrest Bogle is a 87 y.o. male with PMH CAD status post CABG, CHF, COPD, DM, CVA with residual right-sided deficits who presents the emergency department for evaluation of, vomiting, fatigue and shortness of breath.  He states that symptoms worsened yesterday around 1700 but worsened this morning around 1 AM.  On initial presentation, patient arrives mildly hypoxic to 87 to 89% which may be baseline for this patient in the setting of his COPD but was placed on 1 L nasal cannula.  He does not endorse active chest pain but does endorse shortness of breath.  Denies abdominal pain, headache, fever or other systemic symptoms.  HPI     Past Medical History:  Diagnosis Date   Arthritis    Asthma    CAD (coronary artery disease)    Cancer ([place])    skin    Carotid artery occlusion    CHF (congestive heart failure) (H[facility])    Colon polyp    COPD (chronic obstructive pulmonary disease) ([place])    Diabetes mellitus    GERD (gastroesophageal reflux disease)    Heart disease    Hyperlipidemia    Hypertension    Myocardial infarction [facility])    1998    Patient Active Problem List   Diagnosis Date Noted   Acute CVA (cerebrovascular accident) ([place]) 02/21/2017   Stenosis of left carotid artery 02/21/2017   ARF (acute renal failure) ([place]) 02/21/2017   HTN (hypertension) 02/21/2017   Hyperlipidemia 02/21/2017   Occlusion and stenosis of carotid artery without mention of cerebral infarction 08/03/2011    Past Surgical History:  Procedure Laterality Date   CATARACT EXTRACTION     CHOLECYSTECTOMY     Gall bladder   CORONARY ARTERY BYPASS GRAFT  10/03/96   x7   TONSILLECTOMY         Family History  Problem Relation Age of Onset   Heart disease Mother    Aneurysm Sister     Social History   Tobacco Use   Smoking  status: Never   Smokeless tobacco: Never  Vaping Use   Vaping Use: Never used  Substance Use Topics   Alcohol use: No    Alcohol/week: 0.0 standard drinks   Drug use: No    Home Medications Prior to Admission medications   Medication Sig Start Date End Date Taking? Authorizing Provider  atorvastatin (LIPITOR) 80 MG tablet Take 80 mg by mouth daily.      [provider]  carbidopa-levodopa (SINEMET IR) 25-100 MG tablet Take 1 tablet by mouth 4 (four) times daily. Pt takes carbidopa-levodopa extended release and other at same time. 09/07/20   Lomax, Amy, NP  Carbidopa-Levodopa ER (SINEMET CR) 25-100 MG tablet controlled release Take 1 tablet by mouth 4 (four) times daily. 09/07/20   Lomax, Amy, NP  cetirizine (ZYRTEC) 10 MG tablet Take 10 mg by mouth daily.    [provider]  clopidogrel (PLAVIX) 75 MG tablet Take 1 tablet (75 mg total) by mouth daily. 02/24/17   Velvet Bathe, MD  dutasteride (AVODART) 0.5 MG capsule Take 0.5 mg by mouth daily.      [provider]  finasteride (PROSCAR) 5 MG tablet Take 5 mg by mouth daily.  02/16/15   [provider]  fluticasone (FLONASE) 50 MCG/ACT nasal spray Place 1 spray  into both nostrils daily. 10/16/18   [provider]  metFORMIN (GLUCOPHAGE) 500 MG tablet Take 500 mg by mouth daily.     [provider]  mirtazapine (REMERON) 7.5 MG tablet Take 7.5 mg by mouth every evening. 08/14/17   [provider]  nabumetone (RELAFEN) 500 MG tablet Take 500 mg by mouth 2 (two) times daily.      [provider]  omeprazole (PRILOSEC) 40 MG capsule Take 40 mg by mouth daily.  01/28/15   [provider]  polyethylene glycol powder (GLYCOLAX/MIRALAX) powder Take 1 Container by mouth daily.  01/28/15   [provider]  rOPINIRole (REQUIP) 0.25 MG tablet Take 1-2 tablets (0.25-0.5 mg total) by mouth at bedtime. 08/03/20   Melvenia Beam, MD  Tamsulosin HCl (FLOMAX) 0.4 MG CAPS Take  0.4 mg by mouth daily.      [provider]  traMADol (ULTRAM) 50 MG tablet Take 50 mg by mouth every 6 (six) hours as needed. 08/01/17   [provider]    Allergies    Niacin and related  Review of Systems   Review of Systems  Constitutional:  Positive for fatigue. Negative for chills and fever.  HENT:  Negative for ear pain and sore throat.   Eyes:  Negative for pain and visual disturbance.  Respiratory:  Positive for shortness of breath. Negative for cough.   Cardiovascular:  Negative for chest pain and palpitations.  Gastrointestinal:  Positive for nausea and vomiting. Negative for abdominal pain.  Genitourinary:  Negative for dysuria and hematuria.  Musculoskeletal:  Negative for arthralgias and back pain.  Skin:  Negative for color change and rash.  Neurological:  Negative for seizures and syncope.  All other systems reviewed and are negative.  Physical Exam Updated Vital Signs BP 115/62   Pulse 95   Temp 98.2 F (36.8 C) (Oral)   Resp 16   Ht 5\' 6"  (1.676 m)   Wt 58.5 kg   SpO2 90%   BMI 20.82 kg/m   Physical Exam Vitals and nursing note reviewed.  Constitutional:      Appearance: He is well-developed.  HENT:     Head: Normocephalic and atraumatic.  Eyes:     Conjunctiva/sclera: Conjunctivae normal.     Comments: [place] with right pupil greater than left, previously documented in 2019  Cardiovascular:     Rate and Rhythm: Normal rate and regular rhythm.     Heart sounds: Murmur (Holosystolic murmur) heard.  Pulmonary:     Effort: Pulmonary effort is normal. No respiratory distress.     Breath sounds: Normal breath sounds.  Abdominal:     Palpations: Abdomen is soft.     Tenderness: There is no abdominal tenderness.  Musculoskeletal:     Cervical back: Neck supple.  Skin:    General: Skin is warm and dry.  Neurological:     Mental Status: He is alert.     Cranial Nerves: No cranial nerve deficit.     Sensory: No sensory deficit.      Motor: Weakness (Right-sided, baseline) present.    ED Results / Procedures / Treatments   Labs (all labs ordered are listed, but only abnormal results are displayed) Labs Reviewed  CBC WITH DIFFERENTIAL/PLATELET - Abnormal; Notable for the following components:      Result Value   WBC 82.8 (*)    RBC 3.38 (*)    Hemoglobin 10.6 (*)    HCT 32.9 (*)    Neutro Abs  14.9 (*)    Lymphs Abs 63.8 (*)    Monocytes Absolute 2.5 (*)    All other components within normal limits  COMPREHENSIVE METABOLIC PANEL - Abnormal; Notable for the following components:   Glucose, Bld 118 (*)    BUN 26 (*)    Creatinine, Ser 1.58 (*)    Calcium 8.4 (*)    AST 84 (*)    GFR, Estimated 40 (*)    All other components within normal limits  BRAIN NATRIURETIC PEPTIDE - Abnormal; Notable for the following components:   B Natriuretic Peptide 165.0 (*)    All other components within normal limits  TROPONIN I (HIGH SENSITIVITY) - Abnormal; Notable for the following components:   Troponin I (High Sensitivity) 418 (*)    All other components within normal limits  RESP PANEL BY RT-PCR (FLU A&B, COVID) ARPGX2  PATHOLOGIST SMEAR REVIEW  TROPONIN I (HIGH SENSITIVITY)    EKG EKG Interpretation  Date/Time:  Sunday September 13 2020 07:08:59 EDT Ventricular Rate:  93 PR Interval:  206 QRS Duration: 163 QT Interval:  402 QTC Calculation: 500 R Axis:   -24 Text Interpretation: Sinus rhythm Right bundle branch block no STE, no ST depression Confirmed by Omao ([id number]) on 09/13/2020 9:38:41 AM  Radiology DG Chest 2 View  Result Date: 09/13/2020 CLINICAL DATA:  Dyspnea EXAM: CHEST - 2 VIEW COMPARISON:  None FINDINGS: Borderline enlarged cardiac silhouette. Prior median sternotomy. Aortic arch calcifications. There are bilateral interstitial and airspace opacities, right greater than left. No large pleural effusion. No visible pneumothorax. Bilateral shoulder degenerative changes. No acute osseous abnormality.  IMPRESSION: Diffuse interstitial and right greater than left airspace opacities, could represent multifocal pneumonia or asymmetric pulmonary edema. Electronically Signed   By: Maurine Simmering M.D.   On: 09/13/2020 09:00   CT HEAD WO CONTRAST (5MM)  Result Date: 09/13/2020 CLINICAL DATA:  Recent falls.  Nausea EXAM: CT HEAD WITHOUT CONTRAST TECHNIQUE: Contiguous axial images were obtained from the base of the skull through the vertex without intravenous contrast. COMPARISON:  02/21/2017 FINDINGS: Brain: Small bilateral cerebellar infarcts have occurred since prior. Chronic small vessel ischemic gliosis in the supratentorial white matter. No acute hemorrhage, hydrocephalus, or visible acute infarct. Vascular: No hyperdense vessel or unexpected calcification. Skull: Normal. Negative for fracture or focal lesion. Sinuses/Orbits: No acute finding. IMPRESSION: 1. No acute finding. 2. Small chronic cerebellar infarcts which have occurred since 2019. Electronically Signed   By: Monte Fantasia M.D.   On: 09/13/2020 09:13    Procedures Procedures   Medications Ordered in ED Medications  aspirin chewable tablet 324 mg (324 mg Oral Given 09/13/20 8338)    ED Course  I have reviewed the triage vital signs and the nursing notes.  Pertinent labs & imaging results that were available during my care of the patient were reviewed by me and considered in my medical decision making (see chart for details).    MDM Rules/Calculators/A&P                          Patient seen the emergency department for evaluation of fatigue shortness of breath nausea and vomiting.  Cardiopulmonary exam reveals a systolic murmur that radiates up into the carotid likely consistent with aortic stenosis.  Exam also reveals anisocoria with right pupil greater than left previously documented in 2019, still reactive.  Noted weakness noted as is per patient baseline.  Initial lab work reveals a significant leukocytosis to 82.8 with  a  lymphocyte predominance likely concerning for CLL versus leukemoid reaction.  Anemia to 10.6 but normal platelet count.  Troponin elevated to 418 and patient given 325 aspirin.  We will follow a delta troponin and if uptrending initiate heparin drip.  ECG with right bundle branch block pathology but no evidence of ST elevations or depression.  BNP elevated to 165, chest x-ray with pulmonary edema.  Creatinine elevated to 1.58, BUN 26.  In the setting of pulmonary edema and a previous history of CABG, I am concerned that his NSTEMI may be a type II NSTEMI secondary to myocardial demand, but cardiology was consulted and the recommendations are currently pending.  Hematology was also consulted to discuss the possibility of leukostasis versus new onset CLL.  Patient will require admission for shortness of breath, fluid overload, NSTEMI and further work-up of his significant leukocytosis.  In regards the patient's hypoxia, he did not have a significant wheezing on exam, DuoNeb not administered as not increase the patient's myocardial demand any further, and steroids were avoided in the setting of a significant leukocytosis as to not complicate further evaluation with secondary demargination from steroids. Final Clinical Impression(s) / ED Diagnoses Final diagnoses:  None    Rx / DC Orders ED Discharge Orders     None        Tharon Kitch, Debe Coder, MD 09/13/20 [place], [place], MD 09/13/20 [phone]

## 2020-09-13 NOTE — ED Notes (Addendum)
Per daughter, she states she gave pt his carvidopa-levodopa 25-100 immediate release tablet as well as extended release tablet today at 10am. She states he gets these meds 4x daily. EDP aware and pharmacy notified

## 2020-09-13 NOTE — ED Notes (Signed)
edp at bedside  

## 2020-09-13 NOTE — ED Notes (Signed)
Pt placed on 1L of O2 via nasal canula. Sats on room air ranging from 87-89 during sleep. Increased to 93% on 1L. Hx of COPD noted

## 2020-09-13 NOTE — Progress Notes (Signed)
Contact [name] (Daughter/POA) for any consents, medical decision making or procedures. See Contact Information for telephone contact.

## 2020-09-14 ENCOUNTER — Inpatient Hospital Stay (HOSPITAL_COMMUNITY): Payer: Medicare Other

## 2020-09-14 DIAGNOSIS — L8990 Pressure ulcer of unspecified site, unspecified stage: Secondary | ICD-10-CM | POA: Insufficient documentation

## 2020-09-14 DIAGNOSIS — R110 Nausea: Secondary | ICD-10-CM | POA: Diagnosis not present

## 2020-09-14 DIAGNOSIS — G20 Parkinson's disease: Secondary | ICD-10-CM

## 2020-09-14 DIAGNOSIS — R0600 Dyspnea, unspecified: Secondary | ICD-10-CM

## 2020-09-14 DIAGNOSIS — D72820 Lymphocytosis (symptomatic): Secondary | ICD-10-CM | POA: Diagnosis not present

## 2020-09-14 DIAGNOSIS — E1122 Type 2 diabetes mellitus with diabetic chronic kidney disease: Secondary | ICD-10-CM | POA: Diagnosis not present

## 2020-09-14 DIAGNOSIS — Z7189 Other specified counseling: Secondary | ICD-10-CM | POA: Diagnosis not present

## 2020-09-14 DIAGNOSIS — J9601 Acute respiratory failure with hypoxia: Secondary | ICD-10-CM | POA: Diagnosis not present

## 2020-09-14 DIAGNOSIS — I248 Other forms of acute ischemic heart disease: Secondary | ICD-10-CM

## 2020-09-14 DIAGNOSIS — Z515 Encounter for palliative care: Secondary | ICD-10-CM | POA: Diagnosis not present

## 2020-09-14 DIAGNOSIS — N189 Chronic kidney disease, unspecified: Secondary | ICD-10-CM | POA: Diagnosis not present

## 2020-09-14 DIAGNOSIS — R7989 Other specified abnormal findings of blood chemistry: Secondary | ICD-10-CM | POA: Diagnosis not present

## 2020-09-14 DIAGNOSIS — I509 Heart failure, unspecified: Secondary | ICD-10-CM | POA: Diagnosis not present

## 2020-09-14 DIAGNOSIS — Z79899 Other long term (current) drug therapy: Secondary | ICD-10-CM | POA: Diagnosis not present

## 2020-09-14 DIAGNOSIS — D72829 Elevated white blood cell count, unspecified: Secondary | ICD-10-CM | POA: Diagnosis not present

## 2020-09-14 DIAGNOSIS — Z66 Do not resuscitate: Secondary | ICD-10-CM

## 2020-09-14 DIAGNOSIS — D649 Anemia, unspecified: Secondary | ICD-10-CM | POA: Diagnosis not present

## 2020-09-14 DIAGNOSIS — J9691 Respiratory failure, unspecified with hypoxia: Secondary | ICD-10-CM | POA: Diagnosis not present

## 2020-09-14 DIAGNOSIS — J189 Pneumonia, unspecified organism: Secondary | ICD-10-CM | POA: Diagnosis present

## 2020-09-14 LAB — PROCALCITONIN: Procalcitonin: 7.84 ng/mL

## 2020-09-14 LAB — BASIC METABOLIC PANEL
Anion gap: 10 (ref 5–15)
BUN: 28 mg/dL — ABNORMAL HIGH (ref 8–23)
CO2: 23 mmol/L (ref 22–32)
Calcium: 8.6 mg/dL — ABNORMAL LOW (ref 8.9–10.3)
Chloride: 104 mmol/L (ref 98–111)
Creatinine, Ser: 1.63 mg/dL — ABNORMAL HIGH (ref 0.61–1.24)
GFR, Estimated: 38 mL/min — ABNORMAL LOW (ref >60)
Glucose, Bld: 98 mg/dL (ref 70–99)
Potassium: 4.4 mmol/L (ref 3.5–5.1)
Sodium: 137 mmol/L (ref 135–145)

## 2020-09-14 LAB — PHOSPHORUS: Phosphorus: 3.2 mg/dL (ref 2.5–4.6)

## 2020-09-14 LAB — GLUCOSE, CAPILLARY
Glucose-Capillary: 133 mg/dL — ABNORMAL HIGH (ref 70–99)
Glucose-Capillary: 167 mg/dL — ABNORMAL HIGH (ref 70–99)
Glucose-Capillary: 118 mg/dL — ABNORMAL HIGH (ref 70–99)

## 2020-09-14 LAB — MAGNESIUM: Magnesium: 1.4 mg/dL — ABNORMAL LOW (ref 1.7–2.4)

## 2020-09-14 MED ORDER — MAGNESIUM SULFATE IN D5W 1-5 GM/100ML-% IV SOLN
1.0000 g | Freq: Once | INTRAVENOUS | Status: AC
  Administered 2020-09-14: 1 g via INTRAVENOUS
  Filled 2020-09-14: qty 100

## 2020-09-14 MED ORDER — DOXYCYCLINE HYCLATE 100 MG PO TABS
100.0000 mg | ORAL_TABLET | Freq: Two times a day (BID) | ORAL | Status: DC
  Administered 2020-09-14: 100 mg via ORAL
  Filled 2020-09-14: qty 1

## 2020-09-14 MED ORDER — CEFDINIR 300 MG PO CAPS
300.0000 mg | ORAL_CAPSULE | Freq: Every day | ORAL | Status: DC
  Filled 2020-09-14: qty 1

## 2020-09-14 MED ORDER — CEFDINIR 300 MG PO CAPS
300.0000 mg | ORAL_CAPSULE | Freq: Every day | ORAL | Status: DC
  Administered 2020-09-14: 300 mg via ORAL
  Filled 2020-09-14: qty 1

## 2020-09-14 MED ORDER — CEFDINIR 300 MG PO CAPS: 300.0000 mg | ORAL_CAPSULE | Freq: Two times a day (BID) | ORAL | 0 refills | Status: DC

## 2020-09-14 MED ORDER — DOXYCYCLINE HYCLATE 100 MG PO TABS: 100.0000 mg | ORAL_TABLET | Freq: Two times a day (BID) | ORAL | 0 refills | Status: AC

## 2020-09-14 MED ORDER — CEFDINIR 300 MG PO CAPS: 300.0000 mg | ORAL_CAPSULE | Freq: Every day | ORAL | 0 refills | Status: AC

## 2020-09-14 NOTE — Plan of Care (Signed)

## 2020-09-14 NOTE — Progress Notes (Addendum)
[facility] PROGRESS NOTE   Jermari Tamargo YPP:[id number] DOB: [birth year] DOA: 09/13/2020  PCP: Glenda Chroman, MD  Brief History/Interval Summary: 87 y.o. male with medical history significant of history of asthma/COPD, chronic diastolic heart failure, gastroesophageal flux disease, hypertension, hyperlipidemia, nonhemorrhagic stroke with left residual deficits, coronary disease a status post CABG and type 2 diabetes mellitus with nephropathy (chronic kidney disease stage IIIb); who presented to the hospital secondary to shortness of breath, nausea, vomiting and midepigastric discomfort.  Symptoms had been ongoing for about 2 days.  Evaluation revealed elevated troponin levels.  Also found to have significantly elevated leukocyte count.  Hospitalize for further management.    Consultants: Cardiology.  Oncology  Procedures: Transthoracic echocardiogram  Antibiotics: Anti-infectives (From admission, onward)    None       Subjective/Interval History: Patient denies any chest pain but has been having difficulty breathing for 3 to 4 days.  Denies any cough.  No fever or chills.     Assessment/Plan:  Dyspnea/acute respiratory failure with hypoxia/possible NSTEMI Patient saturations were noted to be 87 to 89% on room air.  Placed on 1 L of oxygen.  Patient however does have a history of COPD.  Chest x-ray showed bilateral opacities suggesting either infection or pulmonary edema.  However examination is benign.  No crackles appreciated.  Patient was given Lasix in the emergency department.  We will hold off on further doses for now.  We will wait for cardiology to provide their input.  Infection was considered however patient is afebrile.  Elevated WBC most likely due to CLL.  Hold off on antibiotics for now. Creatinine is noted to be elevated today compared to yesterday. Cardiology evaluation is pending.  Patient noted to be on Plavix and statin.  LDL noted to be 44. Echocardiogram  shows normal systolic function.  Grade 1 diastolic dysfunction is present. Echocardiogram raises concern for moderate to severe aortic stenosis.  Unclear if this could be responsible for some of the symptoms.  Will wait for cardiology to weigh in. Saturations noted to be 99 200%.  Wean down oxygen to maintain saturation greater than 90%. Will repeat chest x-ray to see if there is any change from yesterday  ADDENDUM Seen by cardiology.  They are concerned that this could be an infectious process.  A procalcitonin was ordered which is noted to be greater than 7.  Repeat chest x-ray does suggest some improvement in the right base.  In view of this elevated procalcitonin may not be unreasonable to put him on antibiotics.  Discussed with patient's daughter was very keen on taking the patient home today since she is worried that his Parkinson symptoms would get worse here in the hospital.  Patient has been weaned off of oxygen and is now saturating 99% on room air.  He has been ambulating within the room without difficulty.  He wants to go home.  Even though its not ideal to discharge him at this time but at the same time it is not unreasonable to send him home. Have told the daughter to watch for signs of deterioration and to seek attention immediately if he were to develop high fevers or worsening shortness of breath.    Significant leukocytosis/concern for [facility] oncology has seen the patient.  They have initiated some work-up but they will arrange outpatient follow-up for further management.  History of COPD Not on home oxygen.  No wheezing heard currently.  Continue his home medications.  Diabetes mellitus type 2  with nephropathy Holding oral agents.  Follow CBGs.  Continue SSI.  HbA1c 6.2.  Will not be too aggressive due to his advanced age.  Chronic diastolic CHF Lasix was given as discussed above for x-ray findings and mild hypoxia.    Chronic kidney disease stage IIIb Baseline appears to  be around 1.4 or so.  Slightly higher than his baseline.  Monitor urine output.  Avoid nephrotoxic agents.  Normocytic anemia Drop in hemoglobin is noted though no overt bleeding is present.  Continue to monitor for now.  Check anemia panel.  History of Parkinson's disease Continue with his home medications.  History of nonhemorrhagic stroke with residual left-sided deficits Stable.  History of restless leg syndrome Continue Requip  GERD Continue PPI  History of BPH Continue Flomax  Stage II sacral decubitus Pressure Injury 09/13/20 Sacrum Mid Stage 2 -  Partial thickness loss of dermis presenting as a shallow open injury with a red, pink wound bed without slough. 3 small areas on sacrum - approx 1 cm each - dry, healing, red partial thickness. (Active)  09/13/20 1647  Location: Sacrum  Location Orientation: Mid  Staging: Stage 2 -  Partial thickness loss of dermis presenting as a shallow open injury with a red, pink wound bed without slough.  Wound Description (Comments): 3 small areas on sacrum - approx 1 cm each - dry, healing, red partial thickness.  Present on Admission: Yes     DVT Prophylaxis: Subcutaneous heparin Code Status: Full code Family Communication: No family at bedside.  Discussed with the patient Disposition Plan: Waiting on cardiology input.  Hopefully return home when improved  Status is: Inpatient  Remains inpatient appropriate because:Ongoing diagnostic testing needed not appropriate for outpatient work up and Inpatient level of care appropriate due to severity of illness  Dispo: The patient is from: Home              Anticipated d/c is to: Home              Patient currently is not medically stable to d/c.   Difficult to place patient No         Medications: Scheduled:  atorvastatin  80 mg Oral q1800   carbidopa-levodopa  1 tablet Oral QID   Carbidopa-Levodopa ER  1 tablet Oral QID   clopidogrel  75 mg Oral Daily   fluticasone  1 spray  Each Nare Daily   heparin injection (subcutaneous)  5,000 Units Subcutaneous Q8H   insulin aspart  0-5 Units Subcutaneous QHS   insulin aspart  0-9 Units Subcutaneous TID WC   mirtazapine  7.5 mg Oral QPM   pantoprazole  40 mg Oral Daily   rOPINIRole  0.25-0.5 mg Oral QHS   sodium chloride flush  3 mL Intravenous Q12H   tamsulosin  0.4 mg Oral Daily   Continuous:  sodium chloride     ZOX:WRUEAV chloride, acetaminophen **OR** acetaminophen, bisacodyl, ipratropium-albuterol, ondansetron **OR** ondansetron (ZOFRAN) IV, sodium chloride flush   Objective:  Vital Signs  Vitals:   09/14/20 0017 09/14/20 0315 09/14/20 0700 09/14/20 1100  BP: (!) 91/50 (!) 101/59 135/68 (!) 97/54  Pulse: 77 76 78 84  Resp: 16 15 16 16   Temp: 97.8 F (36.6 C) 97.8 F (36.6 C) 98.2 F (36.8 C) 98.1 F (36.7 C)  TempSrc: Oral Oral Oral Oral  SpO2: 95% 99% 100% 96%  Weight:  56.7 kg    Height:        Intake/Output Summary (Last 24 hours) at  09/14/2020 1126 Last data filed at 09/14/2020 0810 Gross per 24 hour  Intake 123 ml  Output --  Net 123 ml   Filed Weights   09/13/20 0713 09/13/20 1500 09/14/20 0315  Weight: 58.5 kg 57.1 kg 56.7 kg    General appearance: Awake alert.  In no distress Resp: Coarse breath sounds.  Normal effort.  No crackles appreciated.  No wheezing or rhonchi Cardio: S1-S2 is normal regular.  No S3-S4.  No rubs murmurs or bruit GI: Abdomen is soft.  Nontender nondistended.  Bowel sounds are present normal.  No masses organomegaly Extremities: No edema.  Moving all of his extremities Neurologic: Alert and oriented x3.  Left-sided deficits from previous stroke.  Tremulous.    Lab Results:  Data Reviewed: I have personally reviewed following labs and imaging studies  CBC: Recent Labs  Lab 09/13/20 0755 09/13/20 1924  WBC 82.8* 106.2*  NEUTROABS 14.9* 26.6*  HGB 10.6* 9.3*  HCT 32.9* 28.1*  MCV 97.3 94.0  PLT 153 143*    Basic Metabolic Panel: Recent Labs   Lab 09/13/20 0755 09/14/20 0307  NA 137 137  K 3.5 4.4  CL 104 104  CO2 24 23  GLUCOSE 118* 98  BUN 26* 28*  CREATININE 1.58* 1.63*  CALCIUM 8.4* 8.6*  MG  --  1.4*  PHOS  --  3.2    GFR: Estimated Creatinine Clearance: 21.3 mL/min (A) (by C-G formula based on SCr of 1.63 mg/dL (H)).  Liver Function Tests: Recent Labs  Lab 09/13/20 0755  AST 84*  ALT 13  ALKPHOS 109  BILITOT 1.0  PROT 6.6  ALBUMIN 3.6    No results for input(s): LIPASE, AMYLASE in the last 168 hours. No results for input(s): AMMONIA in the last 168 hours.  Coagulation Profile: No results for input(s): INR, PROTIME in the last 168 hours.  Cardiac Enzymes: No results for input(s): CKTOTAL, CKMB, CKMBINDEX, TROPONINI in the last 168 hours.  BNP (last 3 results) No results for input(s): PROBNP in the last 8760 hours.  HbA1C: Recent Labs    09/13/20 1631  HGBA1C 6.2*    CBG: Recent Labs  Lab 09/13/20 1703 09/13/20 2137 09/14/20 0748 09/14/20 1120  GLUCAP 124* 117* 133* 167*    Lipid Profile: Recent Labs    09/13/20 1631  CHOL 91  HDL 35*  LDLCALC 44  TRIG 61  CHOLHDL 2.6    Thyroid Function Tests: Recent Labs    09/13/20 1631  TSH 1.720    Anemia Panel: No results for input(s): VITAMINB12, FOLATE, FERRITIN, TIBC, IRON, RETICCTPCT in the last 72 hours.  Recent Results (from the past 240 hour(s))  Resp Panel by RT-PCR (Flu A&B, Covid) Nasopharyngeal Swab     Status: None   Collection Time: 09/13/20  7:55 AM   Specimen: Nasopharyngeal Swab; Nasopharyngeal(NP) swabs in vial transport medium  Result Value Ref Range Status   SARS Coronavirus 2 by RT PCR NEGATIVE NEGATIVE Final    Comment: (NOTE) SARS-CoV-2 target nucleic acids are NOT DETECTED.  The SARS-CoV-2 RNA is generally detectable in upper respiratory specimens during the acute phase of infection. The lowest concentration of SARS-CoV-2 viral copies this assay can detect is 138 copies/mL. A negative result does  not preclude SARS-Cov-2 infection and should not be used as the sole basis for treatment or other patient management decisions. A negative result may occur with  improper specimen collection/handling, submission of specimen other than nasopharyngeal swab, presence of viral mutation(s) within the areas targeted  by this assay, and inadequate number of viral copies(<138 copies/mL). A negative result must be combined with clinical observations, patient history, and epidemiological information. The expected result is Negative.  Fact Sheet for Patients:  EntrepreneurPulse.com.au  Fact Sheet for Healthcare Providers:  IncredibleEmployment.be  This test is no t yet approved or cleared by the [place] FDA and  has been authorized for detection and/or diagnosis of SARS-CoV-2 by FDA under an Emergency Use Authorization (EUA). This EUA will remain  in effect (meaning this test can be used) for the duration of the COVID-19 declaration under Section 564(b)(1) of the Act, 21 U.S.C.section 360bbb-3(b)(1), unless the authorization is terminated  or revoked sooner.       Influenza A by PCR NEGATIVE NEGATIVE Final   Influenza B by PCR NEGATIVE NEGATIVE Final    Comment: (NOTE) The Xpert Xpress SARS-CoV-2/FLU/RSV plus assay is intended as an aid in the diagnosis of influenza from Nasopharyngeal swab specimens and should not be used as a sole basis for treatment. Nasal washings and aspirates are unacceptable for Xpert Xpress SARS-CoV-2/FLU/RSV testing.  Fact Sheet for Patients: EntrepreneurPulse.com.au  Fact Sheet for Healthcare Providers: IncredibleEmployment.be  This test is not yet approved or cleared by the [place] FDA and has been authorized for detection and/or diagnosis of SARS-CoV-2 by FDA under an Emergency Use Authorization (EUA). This EUA will remain in effect (meaning this test can be used) for the  duration of the COVID-19 declaration under Section 564(b)(1) of the Act, 21 U.S.C. section 360bbb-3(b)(1), unless the authorization is terminated or revoked.  Performed at [facility], [address] [place], [place] [address]       Radiology Studies: DG Chest 2 View  Result Date: 09/13/2020 CLINICAL DATA:  Dyspnea EXAM: CHEST - 2 VIEW COMPARISON:  None FINDINGS: Borderline enlarged cardiac silhouette. Prior median sternotomy. Aortic arch calcifications. There are bilateral interstitial and airspace opacities, right greater than left. No large pleural effusion. No visible pneumothorax. Bilateral shoulder degenerative changes. No acute osseous abnormality. IMPRESSION: Diffuse interstitial and right greater than left airspace opacities, could represent multifocal pneumonia or asymmetric pulmonary edema. Electronically Signed   By: Maurine Simmering M.D.   On: 09/13/2020 09:00   CT HEAD WO CONTRAST (5MM)  Result Date: 09/13/2020 CLINICAL DATA:  Recent falls.  Nausea EXAM: CT HEAD WITHOUT CONTRAST TECHNIQUE: Contiguous axial images were obtained from the base of the skull through the vertex without intravenous contrast. COMPARISON:  02/21/2017 FINDINGS: Brain: Small bilateral cerebellar infarcts have occurred since prior. Chronic small vessel ischemic gliosis in the supratentorial white matter. No acute hemorrhage, hydrocephalus, or visible acute infarct. Vascular: No hyperdense vessel or unexpected calcification. Skull: Normal. Negative for fracture or focal lesion. Sinuses/Orbits: No acute finding. IMPRESSION: 1. No acute finding. 2. Small chronic cerebellar infarcts which have occurred since 2019. Electronically Signed   By: Monte Fantasia M.D.   On: 09/13/2020 09:13   ECHOCARDIOGRAM COMPLETE  Result Date: 09/13/2020    ECHOCARDIOGRAM REPORT   Patient Name:   CECILIA NISHIKAWA Date of Exam: 09/13/2020 Medical Rec #:  [id number]      Height:       66.0 in Accession #:    [id number]     Weight:       129.0  lb Date of Birth:  [birth year]       BSA:          1.660 m Patient Age:    23 years       BP:  101/62 mmHg Patient Gender: M              HR:           75 bpm. Exam Location:  [facility] Procedure: 2D Echo, Cardiac Doppler and Color Doppler Indications:    Elevated Troponin  History:        Patient has prior history of Echocardiogram examinations, most                 recent 02/22/2017. Stroke, Signs/Symptoms:Shortness of Breath;                 Risk Factors:Hypertension and Dyslipidemia.  Sonographer:    Wenda Low Referring Phys: [place]  1. Left ventricular ejection fraction, by estimation, is 70 to 75%. The left ventricle has hyperdynamic function. The left ventricle has no regional wall motion abnormalities. There is moderate left ventricular hypertrophy. Left ventricular diastolic parameters are consistent with Grade I diastolic dysfunction (impaired relaxation).  2. Right ventricular systolic function is normal. The right ventricular size is normal. There is normal pulmonary artery systolic pressure. The estimated right ventricular systolic pressure is 09.3 mmHg.  3. Left atrial size was upper normal.  4. The mitral valve is degenerative. Mild mitral valve regurgitation. No evidence of mitral stenosis. The mean mitral valve gradient is 2.0 mmHg.  5. The aortic valve has an indeterminant number of cusps. There is moderate calcification of the aortic valve. Aortic valve regurgitation is trivial. Moderate to severe, paradoxically low gradient aortic valve stenosis. Aortic valve mean gradient measures 21.0 mmHg. Aortic valve Vmax measures 2.94 m/s. Dimentionless index 0.29.  6. The inferior vena cava is normal in size with greater than 50% respiratory variability, suggesting right atrial pressure of 3 mmHg. Comparison(s): Prior images unable to be directly viewed, comparison made by report only. FINDINGS  Left Ventricle: Left ventricular ejection fraction, by estimation, is 70  to 75%. The left ventricle has hyperdynamic function. The left ventricle has no regional wall motion abnormalities. The left ventricular internal cavity size was normal in size. There is moderate left ventricular hypertrophy. Left ventricular diastolic parameters are consistent with Grade I diastolic dysfunction (impaired relaxation). Right Ventricle: The right ventricular size is normal. No increase in right ventricular wall thickness. Right ventricular systolic function is normal. There is normal pulmonary artery systolic pressure. The tricuspid regurgitant velocity is 1.73 m/s, and  with an assumed right atrial pressure of 3 mmHg, the estimated right ventricular systolic pressure is 81.8 mmHg. Left Atrium: Left atrial size was upper normal. Right Atrium: Right atrial size was normal in size. Pericardium: There is no evidence of pericardial effusion. Mitral Valve: The mitral valve is degenerative in appearance. There is mild thickening of the mitral valve leaflet(s). There is mild calcification of the mitral valve leaflet(s). Mild mitral annular calcification. Mild mitral valve regurgitation. No evidence of mitral valve stenosis. MV peak gradient, 5.1 mmHg. The mean mitral valve gradient is 2.0 mmHg. Tricuspid Valve: The tricuspid valve is grossly normal. Tricuspid valve regurgitation is trivial. Aortic Valve: The aortic valve has an indeterminant number of cusps. There is moderate calcification of the aortic valve. There is mild aortic valve annular calcification. Aortic valve regurgitation is trivial. Moderate to severe aortic stenosis is present. Aortic valve mean gradient measures 21.0 mmHg. Aortic valve peak gradient measures 34.5 mmHg. Aortic valve area, by VTI measures 0.90 cm. Pulmonic Valve: The pulmonic valve was grossly normal. Pulmonic valve regurgitation is trivial. Aorta: The aortic root is normal  in size and structure. Venous: The inferior vena cava is normal in size with greater than 50%  respiratory variability, suggesting right atrial pressure of 3 mmHg. IAS/Shunts: The interatrial septum appears to be lipomatous. No atrial level shunt detected by color flow Doppler.  LEFT VENTRICLE PLAX 2D LVIDd:         4.55 cm  Diastology LVIDs:         2.63 cm  LV e' medial:    6.13 cm/s LV PW:         1.35 cm  LV E/e' medial:  11.6 LV IVS:        1.37 cm  LV e' lateral:   9.58 cm/s LVOT diam:     2.00 cm  LV E/e' lateral: 7.4 LV SV:         53 LV SV Index:   32 LVOT Area:     3.14 cm  RIGHT VENTRICLE RV Basal diam:  4.26 cm RV Mid diam:    3.95 cm RV S prime:     6.42 cm/s TAPSE (M-mode): 1.6 cm LEFT ATRIUM             Index       RIGHT ATRIUM           Index LA diam:        4.40 cm 2.65 cm/m  RA Area:     13.80 cm LA Vol (A2C):   58.1 ml 35.00 ml/m RA Volume:   29.40 ml  17.71 ml/m LA Vol (A4C):   45.9 ml 27.65 ml/m LA Biplane Vol: 52.3 ml 31.51 ml/m  AORTIC VALVE AV Area (Vmax):    0.89 cm AV Area (Vmean):   0.93 cm AV Area (VTI):     0.90 cm AV Vmax:           293.50 cm/s AV Vmean:          193.000 cm/s AV VTI:            0.587 m AV Peak Grad:      34.5 mmHg AV Mean Grad:      21.0 mmHg LVOT Vmax:         83.30 cm/s LVOT Vmean:        57.400 cm/s LVOT VTI:          0.169 m LVOT/AV VTI ratio: 0.29  AORTA Ao Root diam: 3.00 cm Ao Asc diam:  3.10 cm MITRAL VALVE               TRICUSPID VALVE MV Area (PHT): 3.45 cm    TR Peak grad:   12.0 mmHg MV Area VTI:   1.68 cm    TR Vmax:        173.00 cm/s MV Peak grad:  5.1 mmHg MV Mean grad:  2.0 mmHg    SHUNTS MV Vmax:       1.13 m/s    Systemic VTI:  0.17 m MV Vmean:      55.4 cm/s   Systemic Diam: 2.00 cm MV Decel Time: 220 msec MV E velocity: 71.10 cm/s MV A velocity: 60.27 cm/s MV E/A ratio:  1.18 Rozann Lesches MD Electronically signed by Rozann Lesches MD Signature Date/Time: 09/13/2020/1:16:29 PM    Final        LOS: 1 day   Bonnielee Haff  [facility] Pager on [email or web address]  09/14/2020, 11:26 AM

## 2020-09-14 NOTE — Care Management Obs Status (Signed)
[place] NOTIFICATION   Patient Details  Name: Myrick Mcnairy MRN: [id number] Date of Birth: 1922-04-06   Medicare Observation Status Notification Given:  Yes    Bethena Roys, RN 09/14/2020, 4:15 PM

## 2020-09-14 NOTE — Care Management CC44 (Signed)
Condition Code 44 Documentation Completed  Patient Details  Name: Verner Mccrone MRN: [id number] Date of Birth: 1922-03-01   Condition Code 44 given:  Yes Patient signature on Condition Code 44 notice:  Yes Documentation of 2 MD's agreement:  Yes Code 44 added to claim:  Yes    Bethena Roys, RN 09/14/2020, 4:15 PM

## 2020-09-14 NOTE — Consult Note (Signed)
Consultation Note Date: 09/14/2020   Patient Name: Victor Bell  DOB: 05-04-1922  MRN: [id number]  Age / Sex: 87 y.o., male  PCP: Glenda Chroman, MD Referring Physician: Bonnielee Haff, MD  Reason for Consultation: Establishing goals of care  HPI/Patient Profile: 87 y.o. male  with past medical history of asthma/COPD, chronic diastolic heart failure, CVA with left sided residual deficits, CAD s/p CABG, chronic kidney disease stage IIIb, diabetes type 2,  GERD, HTN, and HLD. He presented to the emergency department on 09/13/2020 with shortness of breath, nausea, vomiting, and epigastric discomfort.  In the ED, chest x-ray revealed diffuse interstitial and right greater than left airspace opacity concerning for multifocal pneumonia versus asymmetric pulmonary edema. Labs significant for elevated BNP, marked leukocytosis of 82k, and creatinine of 1.58. Work-up also showed elevated troponin with no acute changes on EKG.   Per oncology, review of 8/21 peripheral blood smear is consistent with chronic lymphocytic leukemia.   Per cardiology, troponin elevation likely consistent with demand ischemia as patient does not appear to have had a significant acute coronary event. He is not a candidate for invasive cardiac evaluation and medical therapy is indicated.    Clinical Assessment and Goals of Care: I have reviewed medical records including EPIC notes, labs and imaging, and examined the patient at bedside. He complains of significant leg cramps (dystonia), likely secondary to Parkinson's. He is mildly confused during my visit.   Present at bedside are his daughter Victor Bell and her husband and we proceed to discuss diagnosis, prognosis, GOC, EOL wishes, disposition, and options. I introduced Palliative Medicine as specialized medical care for people living with serious illness. It focuses on providing relief from the  symptoms and stress of a serious illness.   We discussed a brief life review of the patient. He is a World War II veteran - serving in the [employer] in [place]. 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 He has one daughter Victor Bell. His wife passed away only 6 weeks ago.   He has continued to live independently in his home in [place]. Leann lives nearby and checks on him every day. There has been somewhat of a functional decline over the past few weeks since his wife died. Regarding nutritional status, there has been significant weight loss over the past months.   We discussed his current illness and what it means in the larger context of his ongoing co-morbidities.  Natural disease trajectory of advanced illness was discussed. Created space and opportunity for family to express thoughts and feelings regarding patient's current medical situation. Emotional support provided   I attempted to elicit values and goals of care important to the patient. Family reports it is very important for patient to remain at home as long as possible. He also wishes to avoid rehospitalization.   The difference between full scope medical intervention and comfort care was considered.  I introduced the concept of a comfort path to family, emphasizing that this path involves de-escalating and  stopping full scope medical interventions, allowing a natural course to occur. Discussed that the goal is comfort and dignity rather than cure/prolonging life.   Provided education and counseling at length on the philosophy and benefits of hospice care. Discussed that it offers a holistic approach to care in the setting of end-stage illness/disease, and is about supporting the patient where they are while allowing the natural course to occur. Hospice can help a patient feel as good as possible for as long as possible. Discussed the hospice team ican provide personal care, support for the family,  and help keep patient out of the hospital. Victor Bell is agreeable to hospice care services at home.   We did discuss code status. Encouraged patient to consider DNR/DNI status understanding evidenced based poor outcomes in similar hospitalized patients, as the cause of the arrest is likely associated with chronic/terminal disease rather than a reversible acute cardio-pulmonary event.  I explained that DNR/DNI is a protective measure to keep [place] from harming the patient in their last moments of life. Victor Bell is agreeable to DNR/DNI with understanding that in the event of cardiopulmonary arrest, resuscitation efforts (CPR, defibrillation, ACLS medications, intubation) would not be attempted.    Primary decision maker: Victor Bell (daughter)    SUMMARY OF RECOMMENDATIONS   Code status changed to DNR/DNI - gold form signed and given to daughter Referral to [facility] for home hospice services - to be called in tomorrow morning Recommend starting Baclofen to treat dystonia Pending discharge home this evening   Additional Recommendations (Limitations, Scope, Preferences): Avoid Hospitalization  Prognosis:  < 6 months  Discharge Planning: Home with Hospice      Primary Diagnoses: Present on Admission:  SOB (shortness of breath)  CAP (community acquired pneumonia)   I have reviewed the medical record, interviewed the patient and family, and examined the patient. The following aspects are pertinent.  Past Medical History:  Diagnosis Date   Arthritis    Asthma    CAD (coronary artery disease)    Cancer ([place])    skin    Carotid artery occlusion    CHF (congestive heart failure) (HCC)    Colon polyp    COPD (chronic obstructive pulmonary disease) (HCC)    Diabetes mellitus    GERD (gastroesophageal reflux disease)    Heart disease    Hyperlipidemia    Hypertension    Myocardial infarction ([place])    1998     Family History  Problem Relation Age of Onset   Heart  disease Mother    Aneurysm Sister    Scheduled Meds:  atorvastatin  80 mg Oral q1800   carbidopa-levodopa  1 tablet Oral QID   Carbidopa-Levodopa ER  1 tablet Oral QID   cefdinir  300 mg Oral q1800   clopidogrel  75 mg Oral Daily   doxycycline  100 mg Oral Q12H   fluticasone  1 spray Each Nare Daily   heparin injection (subcutaneous)  5,000 Units Subcutaneous Q8H   insulin aspart  0-5 Units Subcutaneous QHS   insulin aspart  0-9 Units Subcutaneous TID WC   mirtazapine  7.5 mg Oral QPM   pantoprazole  40 mg Oral Daily   rOPINIRole  0.25-0.5 mg Oral QHS   sodium chloride flush  3 mL Intravenous Q12H   tamsulosin  0.4 mg Oral Daily   Continuous Infusions:  sodium chloride     PRN Meds:.sodium chloride, acetaminophen **OR** acetaminophen, bisacodyl, ipratropium-albuterol, ondansetron **OR** ondansetron (ZOFRAN) IV, sodium chloride flush  Allergies  Allergen Reactions   Amoxicillin Nausea And Vomiting    With high dosing   Niacin And Related Other (See Comments)    Burning    Review of Systems  Cardiovascular:  Negative for chest pain.   Physical Exam Vitals reviewed.  Constitutional:      General: He is not in acute distress.    Appearance: He is ill-appearing.     Comments: Frail and elderly  Pulmonary:     Effort: Pulmonary effort is normal.  Neurological:     Mental Status: He is alert.  Psychiatric:        Mood and Affect: Affect is tearful.    Vital Signs: BP (!) 97/54 (BP Location: Left Arm)   Pulse 84   Temp 98.1 F (36.7 C) (Oral)   Resp 16   Ht 5\' 6"  (1.676 m)   Wt 56.7 kg   SpO2 96%   BMI 20.16 kg/m  Pain Scale: 0-10   Pain Score: 3    SpO2: SpO2: 96 % O2 Device:SpO2: 96 % O2 Flow Rate: .O2 Flow Rate (L/min): 2 L/min   Palliative Assessment/Data: PPS 40%     Time In: 1625 Time Out: 1735 Time Total: 70 minutes Greater than 50%  of this time was spent counseling and coordinating care related to the above assessment and plan.  Signed  by: Lavena Bullion, NP   Please contact Palliative Medicine Team phone at [phone] for questions and concerns.  For individual provider: See Shea Evans

## 2020-09-14 NOTE — Discharge Summary (Signed)
[facility]  Physician Discharge Summary   Patient ID: Victor Bell MRN: [id number] DOB/AGE: [birth year] 87 y.o.  Admit date: 09/13/2020 Discharge date: 09/14/2020    PCP: Glenda Chroman, MD  DISCHARGE DIAGNOSES:  Aspiration versus community-acquired pneumonia Coronary artery disease Moderate to severe aortic stenosis Chronic diastolic CHF Significant leukocytosis, concern for CLL History of COPD Diabetes mellitus type 2 with nephropathy Chronic kidney disease stage IIIb History of Parkinson's disease Normocytic anemia Restless leg syndrome   RECOMMENDATIONS FOR OUTPATIENT FOLLOW UP: Outpatient follow-up with oncology in [place] area, to be arranged by medical oncology Outpatient swallow evaluation may be beneficial in the setting of Parkinson's disease    Home Health: None Equipment/Devices: None  CODE STATUS: DNR  DISCHARGE CONDITION: fair  Diet recommendation: As before.  Patient given information regarding dysphagia  INITIAL HISTORY: 87 y.o. male with medical history significant of history of asthma/COPD, chronic diastolic heart failure, gastroesophageal flux disease, hypertension, hyperlipidemia, nonhemorrhagic stroke with left residual deficits, coronary disease a status post CABG and type 2 diabetes mellitus with nephropathy (chronic kidney disease stage IIIb); who presented to the hospital secondary to shortness of breath, nausea, vomiting and midepigastric discomfort.  Symptoms had been ongoing for about 2 days.  Evaluation revealed elevated troponin levels.  Also found to have significantly elevated leukocyte count.  Hospitalize for further management.     Consultants: Cardiology.  Oncology   Procedures: Transthoracic echocardiogram   HOSPITAL COURSE:   Aspiration pneumonia/acute respiratory failure with hypoxia, resolved Patient saturations were noted to be 87 to 89% on room air.  Placed on 1 L of oxygen.  Patient however does have a history of  COPD.  Chest x-ray showed bilateral opacities suggesting either infection or pulmonary edema.  However examination is benign.  No crackles appreciated.  Patient was given Lasix in the emergency department.   Procalcitonin was noted to be elevated.  Chest x-ray was repeated which showed similar appearance.  Patient's daughter does mention that patient does have episodes of cough when he tries to eat and drink at times.  Hence aspiration is a distinct possibility.  Patient however allergic to penicillin.  We will discharge him on Omnicef and doxycycline.   He has been weaned off oxygen with saturations in the mid to late 90s on room air.  Elevated troponin/coronary artery disease/moderate to severe aortic stenosis Patient was seen by cardiologist.  Patient was noted to be on Plavix and statin.  LDL 44. Echocardiogram shows normal systolic function.  Grade 1 diastolic dysfunction is present. Echocardiogram raises concern for moderate to severe aortic stenosis.   Cardiology does not plan to do any further work-up.  Symptoms thought to be due to infectious process rather than de novo cardiac etiology.     Significant leukocytosis/concern for [facility] Medical oncology has seen the patient.  They have initiated some work-up but they will arrange outpatient follow-up for further management.   History of COPD Stable.  Not on home oxygen  Diabetes mellitus type 2 with nephropathy HbA1c 6.2.  Continue medical  Chronic diastolic CHF Will  Chronic kidney disease stage IIIb Baseline appears to be around 1.4 or so.  Slightly higher than his baseline.     Normocytic anemia Drop in hemoglobin is noted though no overt bleeding is present.    History of Parkinson's disease Continue with his home medications.  History of nonhemorrhagic stroke with residual left-sided deficits Stable.  History of restless leg syndrome Continue Requip  GERD Continue PPI  History of BPH  Continue Flomax   Stage II  sacral decubitus Pressure Injury 09/13/20 Sacrum Mid Stage 2 -  Partial thickness loss of dermis presenting as a shallow open injury with a red, pink wound bed without slough. 3 small areas on sacrum - approx 1 cm each - dry, healing, red partial thickness. (Active)  09/13/20 1647  Location: Sacrum  Location Orientation: Mid  Staging: Stage 2 -  Partial thickness loss of dermis presenting as a shallow open injury with a red, pink wound bed without slough.  Wound Description (Comments): 3 small areas on sacrum - approx 1 cm each - dry, healing, red partial thickness.  Present on Admission: Yes      Patient's daughter was worried that patient's Parkinson's would not be adequately managed in the hospital.  She was reassured however she still wanted to take him home.  She understands that patient's condition may worsen.  However patient is stable at this time.  He has been started on antibiotics.  Patient's daughter understands that his overall prognosis is poor considering his multiple comorbidities and advanced age.  Patient was seen by palliative care as well.  CODE STATUS changed to DNR prior to discharge.  Plan is to arrange hospice services.  Since patient and his daughter are both keen on going home and he is otherwise stable, he was discharged home.    PERTINENT LABS:  The results of significant diagnostics from this hospitalization (including imaging, microbiology, ancillary and laboratory) are listed below for reference.    Microbiology: Recent Results (from the past 240 hour(s))  Resp Panel by RT-PCR (Flu A&B, Covid) Nasopharyngeal Swab     Status: None   Collection Time: 09/13/20  7:55 AM   Specimen: Nasopharyngeal Swab; Nasopharyngeal(NP) swabs in vial transport medium  Result Value Ref Range Status   SARS Coronavirus 2 by RT PCR NEGATIVE NEGATIVE Final    Comment: (NOTE) SARS-CoV-2 target nucleic acids are NOT DETECTED.  The SARS-CoV-2 RNA is generally detectable in upper  respiratory specimens during the acute phase of infection. The lowest concentration of SARS-CoV-2 viral copies this assay can detect is 138 copies/mL. A negative result does not preclude SARS-Cov-2 infection and should not be used as the sole basis for treatment or other patient management decisions. A negative result may occur with  improper specimen collection/handling, submission of specimen other than nasopharyngeal swab, presence of viral mutation(s) within the areas targeted by this assay, and inadequate number of viral copies(<138 copies/mL). A negative result must be combined with clinical observations, patient history, and epidemiological information. The expected result is Negative.  Fact Sheet for Patients:  EntrepreneurPulse.com.au  Fact Sheet for Healthcare Providers:  IncredibleEmployment.be  This test is no t yet approved or cleared by the [place] FDA and  has been authorized for detection and/or diagnosis of SARS-CoV-2 by FDA under an Emergency Use Authorization (EUA). This EUA will remain  in effect (meaning this test can be used) for the duration of the COVID-19 declaration under Section 564(b)(1) of the Act, 21 U.S.C.section 360bbb-3(b)(1), unless the authorization is terminated  or revoked sooner.       Influenza A by PCR NEGATIVE NEGATIVE Final   Influenza B by PCR NEGATIVE NEGATIVE Final    Comment: (NOTE) The Xpert Xpress SARS-CoV-2/FLU/RSV plus assay is intended as an aid in the diagnosis of influenza from Nasopharyngeal swab specimens and should not be used as a sole basis for treatment. Nasal washings and aspirates are unacceptable for Xpert Xpress SARS-CoV-2/FLU/RSV testing.  Fact  Sheet for Patients: EntrepreneurPulse.com.au  Fact Sheet for Healthcare Providers: IncredibleEmployment.be  This test is not yet approved or cleared by the [place] FDA and has been  authorized for detection and/or diagnosis of SARS-CoV-2 by FDA under an Emergency Use Authorization (EUA). This EUA will remain in effect (meaning this test can be used) for the duration of the COVID-19 declaration under Section 564(b)(1) of the Act, 21 U.S.C. section 360bbb-3(b)(1), unless the authorization is terminated or revoked.  Performed at [facility], [address] [place], [place] [address]      Labs:  [place]    09/13/20 0941  LDH 194*    Lab Results  Component Value Date   [place] NEGATIVE 09/13/2020      Basic Metabolic Panel: Recent Labs  Lab 09/13/20 0755 09/14/20 0307  NA 137 137  K 3.5 4.4  CL 104 104  CO2 24 23  GLUCOSE 118* 98  BUN 26* 28*  CREATININE 1.58* 1.63*  CALCIUM 8.4* 8.6*  MG  --  1.4*  PHOS  --  3.2   Liver Function Tests: Recent Labs  Lab 09/13/20 0755  AST 84*  ALT 13  ALKPHOS 109  BILITOT 1.0  PROT 6.6  ALBUMIN 3.6    CBC: Recent Labs  Lab 09/13/20 0755 09/13/20 1924  WBC 82.8* 106.2*  NEUTROABS 14.9* 26.6*  HGB 10.6* 9.3*  HCT 32.9* 28.1*  MCV 97.3 94.0  PLT 153 143*    BNP: BNP (last 3 results) Recent Labs    09/13/20 0755  BNP 165.0*     CBG: Recent Labs  Lab 09/13/20 1703 09/13/20 2137 09/14/20 0748 09/14/20 1120 09/14/20 1526  GLUCAP 124* 117* 133* 167* 118*     IMAGING STUDIES DG Chest 2 View  Result Date: 09/13/2020 CLINICAL DATA:  Dyspnea EXAM: CHEST - 2 VIEW COMPARISON:  None FINDINGS: Borderline enlarged cardiac silhouette. Prior median sternotomy. Aortic arch calcifications. There are bilateral interstitial and airspace opacities, right greater than left. No large pleural effusion. No visible pneumothorax. Bilateral shoulder degenerative changes. No acute osseous abnormality. IMPRESSION: Diffuse interstitial and right greater than left airspace opacities, could represent multifocal pneumonia or asymmetric pulmonary edema. Electronically Signed   By: Maurine Simmering M.D.   On: 09/13/2020 09:00   CT HEAD WO CONTRAST (5MM)  Result Date: 09/13/2020 CLINICAL DATA:  Recent falls.  Nausea EXAM: CT HEAD WITHOUT CONTRAST TECHNIQUE: Contiguous axial images were obtained from the base of the skull through the vertex without intravenous contrast. COMPARISON:  02/21/2017 FINDINGS: Brain: Small bilateral cerebellar infarcts have occurred since prior. Chronic small vessel ischemic gliosis in the supratentorial white matter. No acute hemorrhage, hydrocephalus, or visible acute infarct. Vascular: No hyperdense vessel or unexpected calcification. Skull: Normal. Negative for fracture or focal lesion. Sinuses/Orbits: No acute finding. IMPRESSION: 1. No acute finding. 2. Small chronic cerebellar infarcts which have occurred since 2019. Electronically Signed   By: Monte Fantasia M.D.   On: 09/13/2020 09:13   DG CHEST PORT 1 VIEW  Result Date: 09/14/2020 CLINICAL DATA:  Nausea and dyspnea. EXAM: PORTABLE CHEST 1 VIEW COMPARISON:  09/13/2020 FINDINGS: Previous median sternotomy and CABG procedure. Normal heart size. No pleural effusion. Similar appearance of bilateral interstitial and airspace opacities within the right lung and left upper lobe. IMPRESSION: No change in aeration to the lungs compared with prior exam. Electronically Signed   By: Kerby Moors M.D.   On: 09/14/2020 16:14   ECHOCARDIOGRAM COMPLETE  Result Date: 09/13/2020  ECHOCARDIOGRAM REPORT   Patient Name:   LYAM PROVENCIO Date of Exam: 09/13/2020 Medical Rec #:  [id number]      Height:       66.0 in Accession #:    [id number]     Weight:       129.0 lb Date of Birth:  April 07, 1922       BSA:          1.660 m Patient Age:    41 years       BP:           101/62 mmHg Patient Gender: M              HR:           75 bpm. Exam Location:  [facility] Procedure: 2D Echo, Cardiac Doppler and Color Doppler Indications:    Elevated Troponin  History:        Patient has prior history of Echocardiogram examinations, most                  recent 02/22/2017. Stroke, Signs/Symptoms:Shortness of Breath;                 Risk Factors:Hypertension and Dyslipidemia.  Sonographer:    Wenda Low Referring Phys: [place]  1. Left ventricular ejection fraction, by estimation, is 70 to 75%. The left ventricle has hyperdynamic function. The left ventricle has no regional wall motion abnormalities. There is moderate left ventricular hypertrophy. Left ventricular diastolic parameters are consistent with Grade I diastolic dysfunction (impaired relaxation).  2. Right ventricular systolic function is normal. The right ventricular size is normal. There is normal pulmonary artery systolic pressure. The estimated right ventricular systolic pressure is 25.8 mmHg.  3. Left atrial size was upper normal.  4. The mitral valve is degenerative. Mild mitral valve regurgitation. No evidence of mitral stenosis. The mean mitral valve gradient is 2.0 mmHg.  5. The aortic valve has an indeterminant number of cusps. There is moderate calcification of the aortic valve. Aortic valve regurgitation is trivial. Moderate to severe, paradoxically low gradient aortic valve stenosis. Aortic valve mean gradient measures 21.0 mmHg. Aortic valve Vmax measures 2.94 m/s. Dimentionless index 0.29.  6. The inferior vena cava is normal in size with greater than 50% respiratory variability, suggesting right atrial pressure of 3 mmHg. Comparison(s): Prior images unable to be directly viewed, comparison made by report only. FINDINGS  Left Ventricle: Left ventricular ejection fraction, by estimation, is 70 to 75%. The left ventricle has hyperdynamic function. The left ventricle has no regional wall motion abnormalities. The left ventricular internal cavity size was normal in size. There is moderate left ventricular hypertrophy. Left ventricular diastolic parameters are consistent with Grade I diastolic dysfunction (impaired relaxation). Right Ventricle: The right  ventricular size is normal. No increase in right ventricular wall thickness. Right ventricular systolic function is normal. There is normal pulmonary artery systolic pressure. The tricuspid regurgitant velocity is 1.73 m/s, and  with an assumed right atrial pressure of 3 mmHg, the estimated right ventricular systolic pressure is 52.7 mmHg. Left Atrium: Left atrial size was upper normal. Right Atrium: Right atrial size was normal in size. Pericardium: There is no evidence of pericardial effusion. Mitral Valve: The mitral valve is degenerative in appearance. There is mild thickening of the mitral valve leaflet(s). There is mild calcification of the mitral valve leaflet(s). Mild mitral annular calcification. Mild mitral valve regurgitation. No evidence of mitral valve stenosis. MV peak gradient, 5.1 mmHg.  The mean mitral valve gradient is 2.0 mmHg. Tricuspid Valve: The tricuspid valve is grossly normal. Tricuspid valve regurgitation is trivial. Aortic Valve: The aortic valve has an indeterminant number of cusps. There is moderate calcification of the aortic valve. There is mild aortic valve annular calcification. Aortic valve regurgitation is trivial. Moderate to severe aortic stenosis is present. Aortic valve mean gradient measures 21.0 mmHg. Aortic valve peak gradient measures 34.5 mmHg. Aortic valve area, by VTI measures 0.90 cm. Pulmonic Valve: The pulmonic valve was grossly normal. Pulmonic valve regurgitation is trivial. Aorta: The aortic root is normal in size and structure. Venous: The inferior vena cava is normal in size with greater than 50% respiratory variability, suggesting right atrial pressure of 3 mmHg. IAS/Shunts: The interatrial septum appears to be lipomatous. No atrial level shunt detected by color flow Doppler.  LEFT VENTRICLE PLAX 2D LVIDd:         4.55 cm  Diastology LVIDs:         2.63 cm  LV e' medial:    6.13 cm/s LV PW:         1.35 cm  LV E/e' medial:  11.6 LV IVS:        1.37 cm  LV e'  lateral:   9.58 cm/s LVOT diam:     2.00 cm  LV E/e' lateral: 7.4 LV SV:         53 LV SV Index:   32 LVOT Area:     3.14 cm  RIGHT VENTRICLE RV Basal diam:  4.26 cm RV Mid diam:    3.95 cm RV S prime:     6.42 cm/s TAPSE (M-mode): 1.6 cm LEFT ATRIUM             Index       RIGHT ATRIUM           Index LA diam:        4.40 cm 2.65 cm/m  RA Area:     13.80 cm LA Vol (A2C):   58.1 ml 35.00 ml/m RA Volume:   29.40 ml  17.71 ml/m LA Vol (A4C):   45.9 ml 27.65 ml/m LA Biplane Vol: 52.3 ml 31.51 ml/m  AORTIC VALVE AV Area (Vmax):    0.89 cm AV Area (Vmean):   0.93 cm AV Area (VTI):     0.90 cm AV Vmax:           293.50 cm/s AV Vmean:          193.000 cm/s AV VTI:            0.587 m AV Peak Grad:      34.5 mmHg AV Mean Grad:      21.0 mmHg LVOT Vmax:         83.30 cm/s LVOT Vmean:        57.400 cm/s LVOT VTI:          0.169 m LVOT/AV VTI ratio: 0.29  AORTA Ao Root diam: 3.00 cm Ao Asc diam:  3.10 cm MITRAL VALVE               TRICUSPID VALVE MV Area (PHT): 3.45 cm    TR Peak grad:   12.0 mmHg MV Area VTI:   1.68 cm    TR Vmax:        173.00 cm/s MV Peak grad:  5.1 mmHg MV Mean grad:  2.0 mmHg    SHUNTS MV Vmax:       1.13 m/s  Systemic VTI:  0.17 m MV Vmean:      55.4 cm/s   Systemic Diam: 2.00 cm MV Decel Time: 220 msec MV E velocity: 71.10 cm/s MV A velocity: 60.27 cm/s MV E/A ratio:  1.18 Rozann Lesches MD Electronically signed by Rozann Lesches MD Signature Date/Time: 09/13/2020/1:16:29 PM    Final     DISCHARGE EXAMINATION: Vitals:   09/14/20 0017 09/14/20 0315 09/14/20 0700 09/14/20 1100  BP: (!) 91/50 (!) 101/59 135/68 (!) 97/54  Pulse: 77 76 78 84  Resp: 16 15 16 16   Temp: 97.8 F (36.6 C) 97.8 F (36.6 C) 98.2 F (36.8 C) 98.1 F (36.7 C)  TempSrc: Oral Oral Oral Oral  SpO2: 95% 99% 100% 96%  Weight:  56.7 kg    Height:       See progress note from earlier today  DISPOSITION: Home with daughter  Discharge Instructions     Call MD for:  difficulty breathing, headache or visual  disturbances   Complete by: As directed    Call MD for:  extreme fatigue   Complete by: As directed    Call MD for:  persistant dizziness or light-headedness   Complete by: As directed    Call MD for:  persistant nausea and vomiting   Complete by: As directed    Call MD for:  severe uncontrolled pain   Complete by: As directed    Call MD for:  temperature >100.4   Complete by: As directed    Diet general   Complete by: As directed    Discharge instructions   Complete by: As directed    Please take your medications as prescribed.  Follow-up with your PCP or your neurologist and have them refer you to speech therapist to assess your swallowing ability.  Please seek attention immediately if your symptoms were to worsen or if you develop high fevers or feel dizzy or lightheaded.  You were cared for by a hospitalist during your hospital stay. If you have any questions about your discharge medications or the care you received while you were in the hospital after you are discharged, you can call the unit and asked to speak with the hospitalist on call if the hospitalist that took care of you is not available. Once you are discharged, your primary care physician will handle any further medical issues. Please note that NO REFILLS for any discharge medications will be authorized once you are discharged, as it is imperative that you return to your primary care physician (or establish a relationship with a primary care physician if you do not have one) for your aftercare needs so that they can reassess your need for medications and monitor your lab values. If you do not have a primary care physician, you can call [phone] for a physician referral.   Increase activity slowly   Complete by: As directed    No wound care   Complete by: As directed          Allergies as of 09/14/2020       Reactions   Amoxicillin Nausea And Vomiting   With high dosing   Niacin And Related Other (See Comments)    Burning         Medication List     STOP taking these medications    polyethylene glycol powder 17 GM/SCOOP powder Commonly known as: GLYCOLAX/MIRALAX       TAKE these medications    atorvastatin 80 MG tablet Commonly known as: LIPITOR Take  80 mg by mouth daily.   carbidopa-levodopa 25-100 MG tablet Commonly known as: SINEMET IR Take 1 tablet by mouth 4 (four) times daily. Pt takes carbidopa-levodopa extended release and other at same time.   Carbidopa-Levodopa ER 25-100 MG tablet controlled release Commonly known as: SINEMET CR Take 1 tablet by mouth 4 (four) times daily.   cefdinir 300 MG capsule Commonly known as: OMNICEF Take 1 capsule (300 mg total) by mouth daily at 6 PM for 6 days.   cetirizine 10 MG tablet Commonly known as: ZYRTEC Take 10 mg by mouth daily.   clopidogrel 75 MG tablet Commonly known as: PLAVIX Take 1 tablet (75 mg total) by mouth daily.   doxycycline 100 MG tablet Commonly known as: VIBRA-TABS Take 1 tablet (100 mg total) by mouth every 12 (twelve) hours for 7 days.   dutasteride 0.5 MG capsule Commonly known as: AVODART Take 0.5 mg by mouth daily.   finasteride 5 MG tablet Commonly known as: PROSCAR Take 5 mg by mouth daily.   fluticasone 0.05 % cream Commonly known as: CUTIVATE Apply topically 2 (two) times daily as needed.   fluticasone 50 MCG/ACT nasal spray Commonly known as: FLONASE Place 1 spray into both nostrils daily.   metFORMIN 500 MG tablet Commonly known as: GLUCOPHAGE Take 500 mg by mouth daily.   mirtazapine 7.5 MG tablet Commonly known as: REMERON Take 7.5 mg by mouth every evening. What changed: Another medication with the same name was removed. Continue taking this medication, and follow the directions you see here.   multivitamin tablet Take 1 tablet by mouth daily.   nabumetone 500 MG tablet Commonly known as: RELAFEN Take 500 mg by mouth 2 (two) times daily.   omeprazole 40 MG  capsule Commonly known as: PRILOSEC Take 40 mg by mouth daily.   rOPINIRole 0.25 MG tablet Commonly known as: Requip Take 1-2 tablets (0.25-0.5 mg total) by mouth at bedtime.   tamsulosin 0.4 MG Caps capsule Commonly known as: FLOMAX Take 0.4 mg by mouth daily.   traMADol 50 MG tablet Commonly known as: ULTRAM Take 50 mg by mouth every 6 (six) hours as needed.          Follow-up Information     Vyas, Dhruv B, MD. Schedule an appointment as soon as possible for a visit in 1 week(s).   Specialty: Internal Medicine Contact information: [place] [address] [phone]                 TOTAL DISCHARGE TIME: 13 minutes  [facility] Pager on [email or web address]  09/15/2020, 1:32 PM

## 2020-09-14 NOTE — Consult Note (Addendum)
Cardiology Consultation:   Patient ID: Victor Bell MRN: [id number]; DOB: 1922-10-10  Admit date: 09/13/2020 Date of Consult: 09/14/2020  PCP:  Glenda Chroman, MD   [facility] Providers Cardiologist:  None        Patient Profile:   Victor Bell is a 87 y.o. male with a hx of CAD with history of CABG 1997, type 2 diabetes, CKD stage IIIb, COPD, chronic diastolic heart failure, hypertension, hyperlipidemia, GERD, ischemic CVA with left-sided residual deficit, Parkinson's disease, BPH,  who is being seen 09/14/2020 for the evaluation of elevated troponin at the request of Dr. [place] Pink.   History of Present Illness:   Mr. Komar probably had history of CAD with CABG in 1997.  Patient is very hard of hearing and battery of hearing aid is that currently.  His daughter and son-in-law are at bedside to assist history.  His cardiologist had retired and he has been following up with his PCP. Family reports that patient lives independently, but daughter lives nearby. He went to ER yesterday on 09/13/2020 complaining of fatigue, vomiting, shortness of breath that progressively worsened.   He was found hypoxic with pulse ox 87 to 89% on room air at ED.  He has suffered multiple ischemic strokes/TIA in the past.  Daughter noted patient has been choking when he ingest food or liquids recently.  Patient has been feeling overall fatigued and weak, experienced intermittent dizziness, when his blood pressure is low at 68E systolically.  Daughter states these symptoms has been going on for at least several months, and he is not taking any antihypertensives at baseline.  He has some weakness and gait disturbance from previous stroke as well as Parkinson's disease.  He states he almost fell several times at home.  He reports significant weight loss, intermittent shortness of breath with exertion. He denied any history of syncope, chest pain, leg edema.   Diagnostic work-up at admission CKD stage III with  creatinine 1.58 and GFR 48, BNP elevated 165, high sensitive troponin 418>387 >229 >289.  CBC revealed marked leukocytosis with WBC 82,800 and anemia with hemoglobin 10.6.  Flu and COVID-19 negative.  Hemoglobin A1c 6.2%.  TSH WNL. EKG reveals sinus rhythm with ventricular rate of 93, old RBBB, no acute change comparing to EKG from 2019.  CT had revealed no acute intracranial abnormality.  Small chronic cerebellar infarct since 2019.  Chest x-ray revealed diffuse interstitial and right greater than left airspace opacity concerning for multifocal pneumonia versus asymmetric pulmonary edema.  Patient had required 2 L nasal cannula oxygen support, blood pressure low normal, otherwise hemodynamically stable.  He is subsequently admitted to hospital medicine service, medical oncology is consulted, plan for additional chronic leukemia work-up, cardiology is consulted for further input on elevated troponin.    Past Medical History:  Diagnosis Date   Arthritis    Asthma    CAD (coronary artery disease)    Cancer ([place])    skin    Carotid artery occlusion    CHF (congestive heart failure) (HCC)    Colon polyp    COPD (chronic obstructive pulmonary disease) ([place])    Diabetes mellitus    GERD (gastroesophageal reflux disease)    Heart disease    Hyperlipidemia    Hypertension    Myocardial infarction ([place])    1998    Past Surgical History:  Procedure Laterality Date   CATARACT EXTRACTION     CHOLECYSTECTOMY     Gall bladder   CORONARY ARTERY BYPASS GRAFT  10/03/96   x7   TONSILLECTOMY       Home Medications:  Prior to Admission medications   Medication Sig Start Date End Date Taking? Authorizing Provider  atorvastatin (LIPITOR) 80 MG tablet Take 80 mg by mouth daily.     Yes [provider]  carbidopa-levodopa (SINEMET IR) 25-100 MG tablet Take 1 tablet by mouth 4 (four) times daily. Pt takes carbidopa-levodopa extended release and other at same time. 09/07/20  Yes Lomax, Amy, NP   Carbidopa-Levodopa ER (SINEMET CR) 25-100 MG tablet controlled release Take 1 tablet by mouth 4 (four) times daily. 09/07/20  Yes Lomax, Amy, NP  cetirizine (ZYRTEC) 10 MG tablet Take 10 mg by mouth daily.   Yes [provider]  clopidogrel (PLAVIX) 75 MG tablet Take 1 tablet (75 mg total) by mouth daily. 02/24/17  Yes Velvet Bathe, MD  dutasteride (AVODART) 0.5 MG capsule Take 0.5 mg by mouth daily.     Yes [provider]  finasteride (PROSCAR) 5 MG tablet Take 5 mg by mouth daily.  02/16/15  Yes [provider]  fluticasone (CUTIVATE) 0.05 % cream Apply topically 2 (two) times daily as needed. 06/12/20  Yes [provider]  fluticasone (FLONASE) 50 MCG/ACT nasal spray Place 1 spray into both nostrils daily. 10/16/18  Yes [provider]  metFORMIN (GLUCOPHAGE) 500 MG tablet Take 500 mg by mouth daily.    Yes [provider]  mirtazapine (REMERON) 7.5 MG tablet Take 7.5 mg by mouth every evening. 08/14/17  Yes [provider]  Multiple Vitamin (MULTIVITAMIN) tablet Take 1 tablet by mouth daily.   Yes [provider]  nabumetone (RELAFEN) 500 MG tablet Take 500 mg by mouth 2 (two) times daily.     Yes [provider]  omeprazole (PRILOSEC) 40 MG capsule Take 40 mg by mouth daily.  01/28/15  Yes [provider]  rOPINIRole (REQUIP) 0.25 MG tablet Take 1-2 tablets (0.25-0.5 mg total) by mouth at bedtime. 08/03/20  Yes Melvenia Beam, MD  Tamsulosin HCl (FLOMAX) 0.4 MG CAPS Take 0.4 mg by mouth daily.     Yes [provider]  traMADol (ULTRAM) 50 MG tablet Take 50 mg by mouth every 6 (six) hours as needed. 08/01/17  Yes [provider]  mirtazapine (REMERON) 15 MG tablet Take 15 mg by mouth at bedtime. Patient not taking: No sig reported 07/30/20   [provider]  polyethylene glycol powder (GLYCOLAX/MIRALAX) powder Take 1 Container by mouth daily.  Patient not taking: No sig reported 01/28/15    [provider]    Inpatient Medications: Scheduled Meds:  atorvastatin  80 mg Oral q1800   carbidopa-levodopa  1 tablet Oral QID   Carbidopa-Levodopa ER  1 tablet Oral QID   clopidogrel  75 mg Oral Daily   fluticasone  1 spray Each Nare Daily   heparin injection (subcutaneous)  5,000 Units Subcutaneous Q8H   insulin aspart  0-5 Units Subcutaneous QHS   insulin aspart  0-9 Units Subcutaneous TID WC   mirtazapine  7.5 mg Oral QPM   pantoprazole  40 mg Oral Daily   rOPINIRole  0.25-0.5 mg Oral QHS   sodium chloride flush  3 mL Intravenous Q12H   tamsulosin  0.4 mg Oral Daily   Continuous Infusions:  sodium chloride     magnesium sulfate bolus IVPB     PRN Meds: sodium chloride, acetaminophen **OR** acetaminophen, bisacodyl, ipratropium-albuterol, ondansetron **OR** ondansetron (ZOFRAN) IV, sodium chloride flush  Allergies:  Allergies  Allergen Reactions   Amoxicillin Nausea And Vomiting    With high dosing   Niacin And Related Other (See Comments)    Burning     Social History:   Social History   Socioeconomic History   Marital status: Married    Spouse name: Not on file   Number of children: 1   Years of education: 11   Highest education level: Not on file  Occupational History   Occupation: Retired  Tobacco Use   Smoking status: Never   Smokeless tobacco: Never  Scientific laboratory technician Use: Never used  Substance and Sexual Activity   Alcohol use: No    Alcohol/week: 0.0 standard drinks   Drug use: No   Sexual activity: Not Currently  Other Topics Concern   Not on file  Social History Narrative   Drinks coffee in the morning    Social Determinants of Health   Financial Resource Strain: Not on file  Food Insecurity: Not on file  Transportation Needs: Not on file  Physical Activity: Not on file  Stress: Not on file  Social Connections: Not on file  Intimate Partner Violence: Not on file    Family History:    Family History  Problem  Relation Age of Onset   Heart disease Mother    Aneurysm Sister      ROS:  Constitutional: See HPI Eyes: Denied vision change or loss Ears/Nose/Mouth/Throat: Choking after eating intermittently Cardiovascular: denied chest pain/pressure Respiratory: Shortness of breath with exertion Gastrointestinal: See HPI Genital/Urinary: Denied dysuria, hematuria, urinary frequency/urgency Musculoskeletal: Parkinson's disease, global weakness Skin: Denied rash, wound Neuro: Intermittent dizziness Psych: Denied history of depression/anxiety  Endocrine: history of diabetes    Physical Exam/Data:   Vitals:   09/14/20 0017 09/14/20 0315 09/14/20 0700 09/14/20 1100  BP: (!) 91/50 (!) 101/59 135/68 (!) 97/54  Pulse: 77 76 78 84  Resp: 16 15 16 16   Temp: 97.8 F (36.6 C) 97.8 F (36.6 C) 98.2 F (36.8 C) 98.1 F (36.7 C)  TempSrc: Oral Oral Oral Oral  SpO2: 95% 99% 100% 96%  Weight:  56.7 kg    Height:        Intake/Output Summary (Last 24 hours) at 09/14/2020 1159 Last data filed at 09/14/2020 0810 Gross per 24 hour  Intake 123 ml  Output --  Net 123 ml   Last 3 Weights 09/14/2020 09/13/2020 09/13/2020  Weight (lbs) 124 lb 14.4 oz 125 lb 14.1 oz 129 lb  Weight (kg) 56.654 kg 57.1 kg 58.514 kg     Body mass index is 20.16 kg/m.   Vitals:  Vitals:   09/14/20 0700 09/14/20 1100  BP: 135/68 (!) 97/54  Pulse: 78 84  Resp: 16 16  Temp: 98.2 F (36.8 C) 98.1 F (36.7 C)  SpO2: 100% 96%   General Appearance: In no apparent distress, laying in bed, frail elderly, HOH HEENT: Normocephalic, atraumatic. EOMs intact.  Neck: Supple, trachea midline, no JVDs Cardiovascular: Regular rate and rhythm, normal A1-P3, systolic murmur grade III RUSB respiratory: Resting breathing unlabored, lungs sounds clear to auscultation bilaterally, no use of accessory muscles. On room air.  No wheezes, rales or rhonchi.   Gastrointestinal: Bowel sounds positive, abdomen soft, non-tender, non-distended.   Extremities: Able to move all extremities in bed without difficulty, no edema/cyanosis/clubbing Musculoskeletal: Normal muscle bulk and tone, global weakness  skin: Intact, warm, dry. Scattered petechiae noted in arms Neurologic: Alert, oriented to person, place and time. Fluent speech, HOH, no  cognitive deficit, no gross focal neuro deficit Psychiatric: Normal affect. Mood is appropriate.    EKG:  The EKG was personally reviewed and demonstrates:  EKG reveals sinus rhythm with ventricular rate of 93, old RBBB, no acute change comparing to EKG from 2019  Telemetry:  Telemetry was personally reviewed and demonstrates:  SR with occasional PVCs   Relevant CV Studies:  Echo from 09/13/20:   1. Left ventricular ejection fraction, by estimation, is 70 to 75%. The  left ventricle has hyperdynamic function. The left ventricle has no  regional wall motion abnormalities. There is moderate left ventricular  hypertrophy. Left ventricular diastolic  parameters are consistent with Grade I diastolic dysfunction (impaired  relaxation).   2. Right ventricular systolic function is normal. The right ventricular  size is normal. There is normal pulmonary artery systolic pressure. The  estimated right ventricular systolic pressure is 40.8 mmHg.   3. Left atrial size was upper normal.   4. The mitral valve is degenerative. Mild mitral valve regurgitation. No  evidence of mitral stenosis. The mean mitral valve gradient is 2.0 mmHg.   5. The aortic valve has an indeterminant number of cusps. There is  moderate calcification of the aortic valve. Aortic valve regurgitation is  trivial. Moderate to severe, paradoxically low gradient aortic valve  stenosis. Aortic valve mean gradient  measures 21.0 mmHg. Aortic valve Vmax measures 2.94 m/s. Dimentionless  index 0.29.   6. The inferior vena cava is normal in size with greater than 50%  respiratory variability, suggesting right atrial pressure of 3 mmHg.    Comparison(s): Prior images unable to be directly viewed, comparison made  by report only.    Laboratory Data:  High Sensitivity Troponin:   Recent Labs  Lab 09/13/20 0755 09/13/20 0941 09/13/20 1924 09/13/20 2141  TROPONINIHS 418* 387* 229* 289*     Chemistry Recent Labs  Lab 09/13/20 0755 09/14/20 0307  NA 137 137  K 3.5 4.4  CL 104 104  CO2 24 23  GLUCOSE 118* 98  BUN 26* 28*  CREATININE 1.58* 1.63*  CALCIUM 8.4* 8.6*  GFRNONAA 40* 38*  ANIONGAP 9 10    Recent Labs  Lab 09/13/20 0755  PROT 6.6  ALBUMIN 3.6  AST 84*  ALT 13  ALKPHOS 109  BILITOT 1.0   Hematology Recent Labs  Lab 09/13/20 0755 09/13/20 1924  WBC 82.8* 106.2*  RBC 3.38* 2.99*  HGB 10.6* 9.3*  HCT 32.9* 28.1*  MCV 97.3 94.0  MCH 31.4 31.1  MCHC 32.2 33.1  RDW 15.4 15.4  PLT 153 143*   BNP Recent Labs  Lab 09/13/20 0755  BNP 165.0*    DDimer No results for input(s): DDIMER in the last 168 hours.   Radiology/Studies:  DG Chest 2 View  Result Date: 09/13/2020 CLINICAL DATA:  Dyspnea EXAM: CHEST - 2 VIEW COMPARISON:  None FINDINGS: Borderline enlarged cardiac silhouette. Prior median sternotomy. Aortic arch calcifications. There are bilateral interstitial and airspace opacities, right greater than left. No large pleural effusion. No visible pneumothorax. Bilateral shoulder degenerative changes. No acute osseous abnormality. IMPRESSION: Diffuse interstitial and right greater than left airspace opacities, could represent multifocal pneumonia or asymmetric pulmonary edema. Electronically Signed   By: Maurine Simmering M.D.   On: 09/13/2020 09:00   CT HEAD WO CONTRAST (5MM)  Result Date: 09/13/2020 CLINICAL DATA:  Recent falls.  Nausea EXAM: CT HEAD WITHOUT CONTRAST TECHNIQUE: Contiguous axial images were obtained from the base of the skull through the vertex without intravenous  contrast. COMPARISON:  02/21/2017 FINDINGS: Brain: Small bilateral cerebellar infarcts have occurred since  prior. Chronic small vessel ischemic gliosis in the supratentorial white matter. No acute hemorrhage, hydrocephalus, or visible acute infarct. Vascular: No hyperdense vessel or unexpected calcification. Skull: Normal. Negative for fracture or focal lesion. Sinuses/Orbits: No acute finding. IMPRESSION: 1. No acute finding. 2. Small chronic cerebellar infarcts which have occurred since 2019. Electronically Signed   By: Monte Fantasia M.D.   On: 09/13/2020 09:13   ECHOCARDIOGRAM COMPLETE  Result Date: 09/13/2020    ECHOCARDIOGRAM REPORT   Patient Name:   Victor Bell Date of Exam: 09/13/2020 Medical Rec #:  [id number]      Height:       66.0 in Accession #:    [id number]     Weight:       129.0 lb Date of Birth:  January 17, 1923       BSA:          1.660 m Patient Age:    43 years       BP:           101/62 mmHg Patient Gender: M              HR:           75 bpm. Exam Location:  [facility] Procedure: 2D Echo, Cardiac Doppler and Color Doppler Indications:    Elevated Troponin  History:        Patient has prior history of Echocardiogram examinations, most                 recent 02/22/2017. Stroke, Signs/Symptoms:Shortness of Breath;                 Risk Factors:Hypertension and Dyslipidemia.  Sonographer:    Wenda Low Referring Phys: [place]  1. Left ventricular ejection fraction, by estimation, is 70 to 75%. The left ventricle has hyperdynamic function. The left ventricle has no regional wall motion abnormalities. There is moderate left ventricular hypertrophy. Left ventricular diastolic parameters are consistent with Grade I diastolic dysfunction (impaired relaxation).  2. Right ventricular systolic function is normal. The right ventricular size is normal. There is normal pulmonary artery systolic pressure. The estimated right ventricular systolic pressure is 86.7 mmHg.  3. Left atrial size was upper normal.  4. The mitral valve is degenerative. Mild mitral valve regurgitation. No evidence  of mitral stenosis. The mean mitral valve gradient is 2.0 mmHg.  5. The aortic valve has an indeterminant number of cusps. There is moderate calcification of the aortic valve. Aortic valve regurgitation is trivial. Moderate to severe, paradoxically low gradient aortic valve stenosis. Aortic valve mean gradient measures 21.0 mmHg. Aortic valve Vmax measures 2.94 m/s. Dimentionless index 0.29.  6. The inferior vena cava is normal in size with greater than 50% respiratory variability, suggesting right atrial pressure of 3 mmHg. Comparison(s): Prior images unable to be directly viewed, comparison made by report only. FINDINGS  Left Ventricle: Left ventricular ejection fraction, by estimation, is 70 to 75%. The left ventricle has hyperdynamic function. The left ventricle has no regional wall motion abnormalities. The left ventricular internal cavity size was normal in size. There is moderate left ventricular hypertrophy. Left ventricular diastolic parameters are consistent with Grade I diastolic dysfunction (impaired relaxation). Right Ventricle: The right ventricular size is normal. No increase in right ventricular wall thickness. Right ventricular systolic function is normal. There is normal pulmonary artery systolic pressure. The tricuspid regurgitant velocity is 1.73 m/s,  and  with an assumed right atrial pressure of 3 mmHg, the estimated right ventricular systolic pressure is 75.1 mmHg. Left Atrium: Left atrial size was upper normal. Right Atrium: Right atrial size was normal in size. Pericardium: There is no evidence of pericardial effusion. Mitral Valve: The mitral valve is degenerative in appearance. There is mild thickening of the mitral valve leaflet(s). There is mild calcification of the mitral valve leaflet(s). Mild mitral annular calcification. Mild mitral valve regurgitation. No evidence of mitral valve stenosis. MV peak gradient, 5.1 mmHg. The mean mitral valve gradient is 2.0 mmHg. Tricuspid Valve: The  tricuspid valve is grossly normal. Tricuspid valve regurgitation is trivial. Aortic Valve: The aortic valve has an indeterminant number of cusps. There is moderate calcification of the aortic valve. There is mild aortic valve annular calcification. Aortic valve regurgitation is trivial. Moderate to severe aortic stenosis is present. Aortic valve mean gradient measures 21.0 mmHg. Aortic valve peak gradient measures 34.5 mmHg. Aortic valve area, by VTI measures 0.90 cm. Pulmonic Valve: The pulmonic valve was grossly normal. Pulmonic valve regurgitation is trivial. Aorta: The aortic root is normal in size and structure. Venous: The inferior vena cava is normal in size with greater than 50% respiratory variability, suggesting right atrial pressure of 3 mmHg. IAS/Shunts: The interatrial septum appears to be lipomatous. No atrial level shunt detected by color flow Doppler.  LEFT VENTRICLE PLAX 2D LVIDd:         4.55 cm  Diastology LVIDs:         2.63 cm  LV e' medial:    6.13 cm/s LV PW:         1.35 cm  LV E/e' medial:  11.6 LV IVS:        1.37 cm  LV e' lateral:   9.58 cm/s LVOT diam:     2.00 cm  LV E/e' lateral: 7.4 LV SV:         53 LV SV Index:   32 LVOT Area:     3.14 cm  RIGHT VENTRICLE RV Basal diam:  4.26 cm RV Mid diam:    3.95 cm RV S prime:     6.42 cm/s TAPSE (M-mode): 1.6 cm LEFT ATRIUM             Index       RIGHT ATRIUM           Index LA diam:        4.40 cm 2.65 cm/m  RA Area:     13.80 cm LA Vol (A2C):   58.1 ml 35.00 ml/m RA Volume:   29.40 ml  17.71 ml/m LA Vol (A4C):   45.9 ml 27.65 ml/m LA Biplane Vol: 52.3 ml 31.51 ml/m  AORTIC VALVE AV Area (Vmax):    0.89 cm AV Area (Vmean):   0.93 cm AV Area (VTI):     0.90 cm AV Vmax:           293.50 cm/s AV Vmean:          193.000 cm/s AV VTI:            0.587 m AV Peak Grad:      34.5 mmHg AV Mean Grad:      21.0 mmHg LVOT Vmax:         83.30 cm/s LVOT Vmean:        57.400 cm/s LVOT VTI:          0.169 m LVOT/AV VTI ratio: 0.29  AORTA Ao  Root  diam: 3.00 cm Ao Asc diam:  3.10 cm MITRAL VALVE               TRICUSPID VALVE MV Area (PHT): 3.45 cm    TR Peak grad:   12.0 mmHg MV Area VTI:   1.68 cm    TR Vmax:        173.00 cm/s MV Peak grad:  5.1 mmHg MV Mean grad:  2.0 mmHg    SHUNTS MV Vmax:       1.13 m/s    Systemic VTI:  0.17 m MV Vmean:      55.4 cm/s   Systemic Diam: 2.00 cm MV Decel Time: 220 msec MV E velocity: 71.10 cm/s MV A velocity: 60.27 cm/s MV E/A ratio:  1.18 Rozann Lesches MD Electronically signed by Rozann Lesches MD Signature Date/Time: 09/13/2020/1:16:29 PM    Final      Assessment and Plan:   Elevated troponin CAD with hx of remote CABG -Presented with SOB, nausea, vomiting, fatigue  - HS trop 418 >387 >229 > 289 - EKG without acute ischemic change - Echo on 8/21 revealed EF 70 to 75%, LV hyperdynamic function, no RWMA, moderate LVH, grade 1 DD, mild MR, trivial AR, moderate to severe paradoxically low gradient aortic valve stenosis with aortic valve mean gradient 21 mmHg aortic valve Vmax measures 2.43m/s - LDL 44 and A1C 6.2% on 09/13/20  - Discussed with patient and family regarding further ischemic work-up with stress test or cardiac catheterization versus conservative management given newly suspected CLL, CKD stage III, advanced age, complex comorbidity; patient and family felt conservative medical management is what they would want  - continue medical therapy with Plavix, statin; will not add beta-blocker due to low blood pressure; never had chest pain therefore no need for anti-anginal   Chronic diastolic heart failure Aortic stenosis  - Echo as above  - BNP 165 - CXR not impressive for CHF , concern for pneumonia, see below - clinically euvolemic  - not on loop diuretics at home , may DC PRN lasix when increased SOB, weight gain >5 pounds in a week, peripheral edema  Acute hypoxic respiratory failure Aspiration Pneumonia  - Presented with hypoxia, shortness of breath, fatigue, weakness, dizziness  -  Family reports recent onset of choking events after ingestion food and liquid - CXR with concern of pneumonia in my opinion  - will check procalcitonin,  defer further management to IM  Marked leukocytosis with lymphocytosis concerning for chronic leukemia - WBC 82800 >106200, with lymphocytosis, concerning for CLL - reasonable to rule out acute infection such as pneumonia as etiology for acute hypoxia  - will send procalcitonin,  defer further management to oncology   Type II diabetes CKD IIIb COPD Parkinson's disease Anemia History of ischemic CVA GERD - managed per IM     Risk Assessment/Risk Scores:     For questions or updates, please contact [facility] Please consult [email or web address] for contact info under    Signed, Margie Billet, NP  09/14/2020 11:59 AM  Patient seen, examined. Available data reviewed. Agree with findings, assessment, and plan as outlined by Margie Billet, NP.  The patient is independently interviewed and examined.  This elderly gentleman with history of remote CABG presents with a multitude of symptoms including nausea, vomiting, weakness, fatigue, and shortness of breath.  He has multiple lab abnormalities of significance including elevated creatinine of 1.6 on admission, marked leukocytosis initially in the range of 82,000 now greater than 100,000,  and mild troponin elevation with a high sensitivity troponin of approximately 300 with a flat trend.  On exam, this is an elderly gentleman in no distress.  HEENT is normal, neck with normal JVP, bilateral carotid bruits, lungs are clear bilaterally, heart is regular rate and rhythm with a 3/6 harsh late peaking crescendo decrescendo murmur and diminished A2, abdomen is soft and nontender, extremities have no edema.  The patient's echo is personally reviewed and he has moderate calcific aortic stenosis with moderate leaflet calcification and severe restriction.  Transvalvular gradients are clearly in the moderate range,  but there is a question of moderately severe paradoxical low-flow low gradient aortic stenosis.  He does not appear to have had a significant acute coronary syndrome event.  I think his minor degree of troponin elevation is consistent with with demand ischemia in the setting of his comorbid conditions.  At his advanced age with multiple medical problems, he is not a candidate for invasive cardiac evaluation.  Medical therapy is indicated.  He is treated with clopidogrel and atorvastatin.  He is not a candidate for beta-blocker or ACE inhibitor as his blood pressure is too low.  The patient and family are in agreement with a conservative approach to his care and they would clearly favor this type of approach.  Sherren Mocha, M.D. 09/14/2020 2:28 PM

## 2020-09-14 NOTE — Progress Notes (Signed)
IP PROGRESS NOTE  Subjective:   Victor Bell complains of "restless legs ".  This is a chronic issue.  No other complaint today.  Objective: Vital signs in last 24 hours: Blood pressure 135/68, pulse 78, temperature 98.2 F (36.8 C), temperature source Oral, resp. rate 16, height 5\' 6"  (1.676 m), weight 124 lb 14.4 oz (56.7 kg), SpO2 100 %.  Intake/Output from previous day: 08/21 0701 - 08/22 0700 In: 120 [P.O.:120] Out: -   Physical Exam: Lungs: Clear bilaterally Cardiac: Regular rate and rhythm, 2/6 systolic murmur Abdomen: No hepatosplenomegaly Extremities: No leg edema    Lab Results: Recent Labs    09/13/20 0755 09/13/20 1924  WBC 82.8* 106.2*  HGB 10.6* 9.3*  HCT 32.9* 28.1*  PLT 153 143*   Peripheral blood smear 09/13/2020: Marked leukocytosis.  The majority of the white cells are mature appearing lymphoid cells with a high nuclear to cytoplasmic ratio, occasional nucleoli.  Some of the cells have cytoplasmic villous or "hairy "projections.  Scattered neutrophils and band forms.  Rare myelocyte.  The platelets appear mildly decreased in number.  The platelets are small.  There are ovalocytes, teardrops, and acanthocytes.  The polychromasia is not increased. BMET Recent Labs    09/13/20 0755 09/14/20 0307  NA 137 137  K 3.5 4.4  CL 104 104  CO2 24 23  GLUCOSE 118* 98  BUN 26* 28*  CREATININE 1.58* 1.63*  CALCIUM 8.4* 8.6*    No results found for: CEA1, CEA, [id number], CA125  Studies/Results: DG Chest 2 View  Result Date: 09/13/2020 CLINICAL DATA:  Dyspnea EXAM: CHEST - 2 VIEW COMPARISON:  None FINDINGS: Borderline enlarged cardiac silhouette. Prior median sternotomy. Aortic arch calcifications. There are bilateral interstitial and airspace opacities, right greater than left. No large pleural effusion. No visible pneumothorax. Bilateral shoulder degenerative changes. No acute osseous abnormality. IMPRESSION: Diffuse interstitial and right greater than left  airspace opacities, could represent multifocal pneumonia or asymmetric pulmonary edema. Electronically Signed   By: Maurine Simmering M.D.   On: 09/13/2020 09:00   CT HEAD WO CONTRAST (5MM)  Result Date: 09/13/2020 CLINICAL DATA:  Recent falls.  Nausea EXAM: CT HEAD WITHOUT CONTRAST TECHNIQUE: Contiguous axial images were obtained from the base of the skull through the vertex without intravenous contrast. COMPARISON:  02/21/2017 FINDINGS: Brain: Small bilateral cerebellar infarcts have occurred since prior. Chronic small vessel ischemic gliosis in the supratentorial white matter. No acute hemorrhage, hydrocephalus, or visible acute infarct. Vascular: No hyperdense vessel or unexpected calcification. Skull: Normal. Negative for fracture or focal lesion. Sinuses/Orbits: No acute finding. IMPRESSION: 1. No acute finding. 2. Small chronic cerebellar infarcts which have occurred since 2019. Electronically Signed   By: Monte Fantasia M.D.   On: 09/13/2020 09:13   ECHOCARDIOGRAM COMPLETE  Result Date: 09/13/2020    ECHOCARDIOGRAM REPORT   Patient Name:   Victor Bell Date of Exam: 09/13/2020 Medical Rec #:  [id number]      Height:       66.0 in Accession #:    [id number]     Weight:       129.0 lb Date of Birth:  01-16-1923       BSA:          1.660 m Patient Age:    87 years       BP:           101/62 mmHg Patient Gender: M              HR:  75 bpm. Exam Location:  [facility] Procedure: 2D Echo, Cardiac Doppler and Color Doppler Indications:    Elevated Troponin  History:        Patient has prior history of Echocardiogram examinations, most                 recent 02/22/2017. Stroke, Signs/Symptoms:Shortness of Breath;                 Risk Factors:Hypertension and Dyslipidemia.  Sonographer:    Wenda Low Referring Phys: [place]  1. Left ventricular ejection fraction, by estimation, is 70 to 75%. The left ventricle has hyperdynamic function. The left ventricle has no regional wall  motion abnormalities. There is moderate left ventricular hypertrophy. Left ventricular diastolic parameters are consistent with Grade I diastolic dysfunction (impaired relaxation).  2. Right ventricular systolic function is normal. The right ventricular size is normal. There is normal pulmonary artery systolic pressure. The estimated right ventricular systolic pressure is 01.0 mmHg.  3. Left atrial size was upper normal.  4. The mitral valve is degenerative. Mild mitral valve regurgitation. No evidence of mitral stenosis. The mean mitral valve gradient is 2.0 mmHg.  5. The aortic valve has an indeterminant number of cusps. There is moderate calcification of the aortic valve. Aortic valve regurgitation is trivial. Moderate to severe, paradoxically low gradient aortic valve stenosis. Aortic valve mean gradient measures 21.0 mmHg. Aortic valve Vmax measures 2.94 m/s. Dimentionless index 0.29.  6. The inferior vena cava is normal in size with greater than 50% respiratory variability, suggesting right atrial pressure of 3 mmHg. Comparison(s): Prior images unable to be directly viewed, comparison made by report only. FINDINGS  Left Ventricle: Left ventricular ejection fraction, by estimation, is 70 to 75%. The left ventricle has hyperdynamic function. The left ventricle has no regional wall motion abnormalities. The left ventricular internal cavity size was normal in size. There is moderate left ventricular hypertrophy. Left ventricular diastolic parameters are consistent with Grade I diastolic dysfunction (impaired relaxation). Right Ventricle: The right ventricular size is normal. No increase in right ventricular wall thickness. Right ventricular systolic function is normal. There is normal pulmonary artery systolic pressure. The tricuspid regurgitant velocity is 1.73 m/s, and  with an assumed right atrial pressure of 3 mmHg, the estimated right ventricular systolic pressure is 27.2 mmHg. Left Atrium: Left atrial size  was upper normal. Right Atrium: Right atrial size was normal in size. Pericardium: There is no evidence of pericardial effusion. Mitral Valve: The mitral valve is degenerative in appearance. There is mild thickening of the mitral valve leaflet(s). There is mild calcification of the mitral valve leaflet(s). Mild mitral annular calcification. Mild mitral valve regurgitation. No evidence of mitral valve stenosis. MV peak gradient, 5.1 mmHg. The mean mitral valve gradient is 2.0 mmHg. Tricuspid Valve: The tricuspid valve is grossly normal. Tricuspid valve regurgitation is trivial. Aortic Valve: The aortic valve has an indeterminant number of cusps. There is moderate calcification of the aortic valve. There is mild aortic valve annular calcification. Aortic valve regurgitation is trivial. Moderate to severe aortic stenosis is present. Aortic valve mean gradient measures 21.0 mmHg. Aortic valve peak gradient measures 34.5 mmHg. Aortic valve area, by VTI measures 0.90 cm. Pulmonic Valve: The pulmonic valve was grossly normal. Pulmonic valve regurgitation is trivial. Aorta: The aortic root is normal in size and structure. Venous: The inferior vena cava is normal in size with greater than 50% respiratory variability, suggesting right atrial pressure of 3 mmHg. IAS/Shunts: The  interatrial septum appears to be lipomatous. No atrial level shunt detected by color flow Doppler.  LEFT VENTRICLE PLAX 2D LVIDd:         4.55 cm  Diastology LVIDs:         2.63 cm  LV e' medial:    6.13 cm/s LV PW:         1.35 cm  LV E/e' medial:  11.6 LV IVS:        1.37 cm  LV e' lateral:   9.58 cm/s LVOT diam:     2.00 cm  LV E/e' lateral: 7.4 LV SV:         53 LV SV Index:   32 LVOT Area:     3.14 cm  RIGHT VENTRICLE RV Basal diam:  4.26 cm RV Mid diam:    3.95 cm RV S prime:     6.42 cm/s TAPSE (M-mode): 1.6 cm LEFT ATRIUM             Index       RIGHT ATRIUM           Index LA diam:        4.40 cm 2.65 cm/m  RA Area:     13.80 cm LA Vol  (A2C):   58.1 ml 35.00 ml/m RA Volume:   29.40 ml  17.71 ml/m LA Vol (A4C):   45.9 ml 27.65 ml/m LA Biplane Vol: 52.3 ml 31.51 ml/m  AORTIC VALVE AV Area (Vmax):    0.89 cm AV Area (Vmean):   0.93 cm AV Area (VTI):     0.90 cm AV Vmax:           293.50 cm/s AV Vmean:          193.000 cm/s AV VTI:            0.587 m AV Peak Grad:      34.5 mmHg AV Mean Grad:      21.0 mmHg LVOT Vmax:         83.30 cm/s LVOT Vmean:        57.400 cm/s LVOT VTI:          0.169 m LVOT/AV VTI ratio: 0.29  AORTA Ao Root diam: 3.00 cm Ao Asc diam:  3.10 cm MITRAL VALVE               TRICUSPID VALVE MV Area (PHT): 3.45 cm    TR Peak grad:   12.0 mmHg MV Area VTI:   1.68 cm    TR Vmax:        173.00 cm/s MV Peak grad:  5.1 mmHg MV Mean grad:  2.0 mmHg    SHUNTS MV Vmax:       1.13 m/s    Systemic VTI:  0.17 m MV Vmean:      55.4 cm/s   Systemic Diam: 2.00 cm MV Decel Time: 220 msec MV E velocity: 71.10 cm/s MV A velocity: 60.27 cm/s MV E/A ratio:  1.18 Rozann Lesches MD Electronically signed by Rozann Lesches MD Signature Date/Time: 09/13/2020/1:16:29 PM    Final     Medications: I have reviewed the patient's current medications.  Assessment/Plan: Leukocytosis with lymphocyte predominance-review of 09/13/2020 peripheral blood smear consistent with chronic leukemia Anemia 3.   Elevated troponin 4.   Nausea 5.   Hypoxia with bilateral airspace opacities 6.   History of CAD 7.   Diabetes 8.  Parkinson's 9.  BPH 10.  History of a CVA 11.  History of gastroesophageal reflux disease 12.  Chronic renal failure   Mr. Holcomb has marked leukocytosis.  The peripheral blood smear is consistent with a leukemic process.  The most likely diagnoses are CLL or hairy cell leukemia.  We will submit peripheral blood for flow cytometry in order to obtain a diagnosis.  He appears asymptomatic from the leukemia at present.  We can consider systemic therapy options if he develops progressive or symptomatic  anemia.  Recommendations: Peripheral blood for flow cytometry, chronic leukemia panel Evaluation of elevated troponin/CAD per cardiology I will follow-up on the flow cytometry and arrange for outpatient follow-up in the hematology clinic   LOS: 1 day   Betsy Coder, MD   09/14/2020, 8:55 AM

## 2020-09-15 ENCOUNTER — Other Ambulatory Visit: Payer: Self-pay | Admitting: Oncology

## 2020-09-15 ENCOUNTER — Telehealth: Payer: Self-pay

## 2020-09-15 DIAGNOSIS — D72820 Lymphocytosis (symptomatic): Secondary | ICD-10-CM

## 2020-09-15 LAB — PATHOLOGIST SMEAR REVIEW

## 2020-09-15 NOTE — Telephone Encounter (Signed)
Palliative Medicine RN Note: Rec'd request from PMT NP Gregary Signs to follow up with [facility]; she left a message with Cassandra there but has not heard back.  I called [facility] but also had to leave a message for the referral dept. They will call me back to ensure they have what they need.  Marjie Skiff Jentzen Minasyan, RN, BSN, [facility] Palliative Medicine Team 09/15/2020 3:05 PM Office [phone]

## 2020-09-16 ENCOUNTER — Telehealth: Payer: Self-pay

## 2020-09-16 LAB — SURGICAL PATHOLOGY

## 2020-09-16 NOTE — Telephone Encounter (Signed)
ADDENDUM:  Rec'd call from [facility]. They had not rec'd referral but will start working it up now.   Marjie Skiff Zen Felling, RN, BSN, [facility] Palliative Medicine Team 09/16/2020 2:16 PM Office [phone]

## 2020-09-16 NOTE — Telephone Encounter (Signed)
Palliative Medicine RN Note: I did not hear back yesterday from [facility] to confirm they rec'd the referral. I called them again today and left another message for Cassandra.  Marjie Skiff Nahia Nissan, RN, BSN, [facility] Palliative Medicine Team 09/16/2020 8:28 AM Office [phone]

## 2020-09-17 DIAGNOSIS — C9590 Leukemia, unspecified not having achieved remission: Secondary | ICD-10-CM | POA: Diagnosis not present

## 2020-09-17 DIAGNOSIS — I10 Essential (primary) hypertension: Secondary | ICD-10-CM | POA: Diagnosis not present

## 2020-09-17 DIAGNOSIS — G20 Parkinson's disease: Secondary | ICD-10-CM | POA: Diagnosis not present

## 2020-09-17 DIAGNOSIS — Z299 Encounter for prophylactic measures, unspecified: Secondary | ICD-10-CM | POA: Diagnosis not present

## 2020-09-17 DIAGNOSIS — M461 Sacroiliitis, not elsewhere classified: Secondary | ICD-10-CM | POA: Diagnosis not present

## 2020-09-17 DIAGNOSIS — E1165 Type 2 diabetes mellitus with hyperglycemia: Secondary | ICD-10-CM | POA: Diagnosis not present

## 2020-09-18 DIAGNOSIS — C9590 Leukemia, unspecified not having achieved remission: Secondary | ICD-10-CM | POA: Diagnosis not present

## 2020-09-18 DIAGNOSIS — I509 Heart failure, unspecified: Secondary | ICD-10-CM | POA: Diagnosis not present

## 2020-09-18 DIAGNOSIS — G20 Parkinson's disease: Secondary | ICD-10-CM | POA: Diagnosis not present

## 2020-09-18 DIAGNOSIS — R1310 Dysphagia, unspecified: Secondary | ICD-10-CM | POA: Diagnosis not present

## 2020-09-18 DIAGNOSIS — E785 Hyperlipidemia, unspecified: Secondary | ICD-10-CM | POA: Diagnosis not present

## 2020-09-18 DIAGNOSIS — I519 Heart disease, unspecified: Secondary | ICD-10-CM | POA: Diagnosis not present

## 2020-09-18 DIAGNOSIS — G2581 Restless legs syndrome: Secondary | ICD-10-CM | POA: Diagnosis not present

## 2020-09-18 DIAGNOSIS — I10 Essential (primary) hypertension: Secondary | ICD-10-CM | POA: Diagnosis not present

## 2020-09-18 DIAGNOSIS — N183 Chronic kidney disease, stage 3 unspecified: Secondary | ICD-10-CM | POA: Diagnosis not present

## 2020-09-18 DIAGNOSIS — E118 Type 2 diabetes mellitus with unspecified complications: Secondary | ICD-10-CM | POA: Diagnosis not present

## 2020-09-18 DIAGNOSIS — I639 Cerebral infarction, unspecified: Secondary | ICD-10-CM | POA: Diagnosis not present

## 2020-09-18 DIAGNOSIS — J449 Chronic obstructive pulmonary disease, unspecified: Secondary | ICD-10-CM | POA: Diagnosis not present

## 2020-09-18 DIAGNOSIS — K219 Gastro-esophageal reflux disease without esophagitis: Secondary | ICD-10-CM | POA: Diagnosis not present

## 2020-09-22 ENCOUNTER — Encounter: Payer: Self-pay | Admitting: Family Medicine

## 2020-09-24 DIAGNOSIS — I639 Cerebral infarction, unspecified: Secondary | ICD-10-CM | POA: Diagnosis not present

## 2020-09-24 DIAGNOSIS — C9590 Leukemia, unspecified not having achieved remission: Secondary | ICD-10-CM | POA: Diagnosis not present

## 2020-09-24 DIAGNOSIS — K219 Gastro-esophageal reflux disease without esophagitis: Secondary | ICD-10-CM | POA: Diagnosis not present

## 2020-09-24 DIAGNOSIS — I519 Heart disease, unspecified: Secondary | ICD-10-CM | POA: Diagnosis not present

## 2020-09-24 DIAGNOSIS — E785 Hyperlipidemia, unspecified: Secondary | ICD-10-CM | POA: Diagnosis not present

## 2020-09-24 DIAGNOSIS — J449 Chronic obstructive pulmonary disease, unspecified: Secondary | ICD-10-CM | POA: Diagnosis not present

## 2020-09-24 DIAGNOSIS — G20 Parkinson's disease: Secondary | ICD-10-CM | POA: Diagnosis not present

## 2020-09-24 DIAGNOSIS — E118 Type 2 diabetes mellitus with unspecified complications: Secondary | ICD-10-CM | POA: Diagnosis not present

## 2020-09-24 DIAGNOSIS — I509 Heart failure, unspecified: Secondary | ICD-10-CM | POA: Diagnosis not present

## 2020-09-24 DIAGNOSIS — I10 Essential (primary) hypertension: Secondary | ICD-10-CM | POA: Diagnosis not present

## 2020-09-24 DIAGNOSIS — G2581 Restless legs syndrome: Secondary | ICD-10-CM | POA: Diagnosis not present

## 2020-09-24 DIAGNOSIS — N183 Chronic kidney disease, stage 3 unspecified: Secondary | ICD-10-CM | POA: Diagnosis not present

## 2020-09-24 DIAGNOSIS — R1310 Dysphagia, unspecified: Secondary | ICD-10-CM | POA: Diagnosis not present

## 2020-09-25 DIAGNOSIS — I509 Heart failure, unspecified: Secondary | ICD-10-CM | POA: Diagnosis not present

## 2020-09-25 DIAGNOSIS — C9590 Leukemia, unspecified not having achieved remission: Secondary | ICD-10-CM | POA: Diagnosis not present

## 2020-09-25 DIAGNOSIS — G20 Parkinson's disease: Secondary | ICD-10-CM | POA: Diagnosis not present

## 2020-09-25 DIAGNOSIS — I519 Heart disease, unspecified: Secondary | ICD-10-CM | POA: Diagnosis not present

## 2020-09-25 DIAGNOSIS — E118 Type 2 diabetes mellitus with unspecified complications: Secondary | ICD-10-CM | POA: Diagnosis not present

## 2020-09-25 DIAGNOSIS — J449 Chronic obstructive pulmonary disease, unspecified: Secondary | ICD-10-CM | POA: Diagnosis not present

## 2020-09-29 DIAGNOSIS — E118 Type 2 diabetes mellitus with unspecified complications: Secondary | ICD-10-CM | POA: Diagnosis not present

## 2020-09-29 DIAGNOSIS — I509 Heart failure, unspecified: Secondary | ICD-10-CM | POA: Diagnosis not present

## 2020-09-29 DIAGNOSIS — G20 Parkinson's disease: Secondary | ICD-10-CM | POA: Diagnosis not present

## 2020-09-29 DIAGNOSIS — C9590 Leukemia, unspecified not having achieved remission: Secondary | ICD-10-CM | POA: Diagnosis not present

## 2020-09-29 DIAGNOSIS — J449 Chronic obstructive pulmonary disease, unspecified: Secondary | ICD-10-CM | POA: Diagnosis not present

## 2020-09-29 DIAGNOSIS — I519 Heart disease, unspecified: Secondary | ICD-10-CM | POA: Diagnosis not present

## 2020-10-02 ENCOUNTER — Ambulatory Visit (HOSPITAL_COMMUNITY): Payer: Medicare Other | Admitting: Hematology and Oncology

## 2020-10-03 DIAGNOSIS — R0689 Other abnormalities of breathing: Secondary | ICD-10-CM | POA: Diagnosis not present

## 2020-10-03 DIAGNOSIS — I509 Heart failure, unspecified: Secondary | ICD-10-CM | POA: Diagnosis not present

## 2020-10-03 DIAGNOSIS — G20 Parkinson's disease: Secondary | ICD-10-CM | POA: Diagnosis not present

## 2020-10-03 DIAGNOSIS — J449 Chronic obstructive pulmonary disease, unspecified: Secondary | ICD-10-CM | POA: Diagnosis not present

## 2020-10-03 DIAGNOSIS — R110 Nausea: Secondary | ICD-10-CM | POA: Diagnosis not present

## 2020-10-03 DIAGNOSIS — E118 Type 2 diabetes mellitus with unspecified complications: Secondary | ICD-10-CM | POA: Diagnosis not present

## 2020-10-03 DIAGNOSIS — I10 Essential (primary) hypertension: Secondary | ICD-10-CM | POA: Diagnosis not present

## 2020-10-03 DIAGNOSIS — C9590 Leukemia, unspecified not having achieved remission: Secondary | ICD-10-CM | POA: Diagnosis not present

## 2020-10-03 DIAGNOSIS — I519 Heart disease, unspecified: Secondary | ICD-10-CM | POA: Diagnosis not present

## 2020-10-08 DIAGNOSIS — G20 Parkinson's disease: Secondary | ICD-10-CM | POA: Diagnosis not present

## 2020-10-08 DIAGNOSIS — J449 Chronic obstructive pulmonary disease, unspecified: Secondary | ICD-10-CM | POA: Diagnosis not present

## 2020-10-08 DIAGNOSIS — I509 Heart failure, unspecified: Secondary | ICD-10-CM | POA: Diagnosis not present

## 2020-10-08 DIAGNOSIS — E118 Type 2 diabetes mellitus with unspecified complications: Secondary | ICD-10-CM | POA: Diagnosis not present

## 2020-10-08 DIAGNOSIS — I519 Heart disease, unspecified: Secondary | ICD-10-CM | POA: Diagnosis not present

## 2020-10-08 DIAGNOSIS — C9590 Leukemia, unspecified not having achieved remission: Secondary | ICD-10-CM | POA: Diagnosis not present

## 2020-10-15 ENCOUNTER — Other Ambulatory Visit: Payer: Self-pay | Admitting: *Deleted

## 2020-10-15 DIAGNOSIS — G20 Parkinson's disease: Secondary | ICD-10-CM | POA: Diagnosis not present

## 2020-10-15 DIAGNOSIS — J449 Chronic obstructive pulmonary disease, unspecified: Secondary | ICD-10-CM | POA: Diagnosis not present

## 2020-10-15 DIAGNOSIS — C9590 Leukemia, unspecified not having achieved remission: Secondary | ICD-10-CM | POA: Diagnosis not present

## 2020-10-15 DIAGNOSIS — I519 Heart disease, unspecified: Secondary | ICD-10-CM | POA: Diagnosis not present

## 2020-10-15 DIAGNOSIS — I509 Heart failure, unspecified: Secondary | ICD-10-CM | POA: Diagnosis not present

## 2020-10-15 DIAGNOSIS — E118 Type 2 diabetes mellitus with unspecified complications: Secondary | ICD-10-CM | POA: Diagnosis not present

## 2020-10-15 MED ORDER — CARBIDOPA-LEVODOPA 25-100 MG PO TABS: 1.0000 | ORAL_TABLET | Freq: Four times a day (QID) | ORAL | 0 refills | Status: DC

## 2020-10-15 MED ORDER — CARBIDOPA-LEVODOPA ER 25-100 MG PO TBCR: 1.0000 | EXTENDED_RELEASE_TABLET | Freq: Four times a day (QID) | ORAL | 0 refills | Status: DC

## 2020-10-17 DIAGNOSIS — I519 Heart disease, unspecified: Secondary | ICD-10-CM | POA: Diagnosis not present

## 2020-10-17 DIAGNOSIS — G20 Parkinson's disease: Secondary | ICD-10-CM | POA: Diagnosis not present

## 2020-10-17 DIAGNOSIS — C9590 Leukemia, unspecified not having achieved remission: Secondary | ICD-10-CM | POA: Diagnosis not present

## 2020-10-17 DIAGNOSIS — J449 Chronic obstructive pulmonary disease, unspecified: Secondary | ICD-10-CM | POA: Diagnosis not present

## 2020-10-17 DIAGNOSIS — I509 Heart failure, unspecified: Secondary | ICD-10-CM | POA: Diagnosis not present

## 2020-10-17 DIAGNOSIS — E118 Type 2 diabetes mellitus with unspecified complications: Secondary | ICD-10-CM | POA: Diagnosis not present

## 2020-10-22 DIAGNOSIS — I519 Heart disease, unspecified: Secondary | ICD-10-CM | POA: Diagnosis not present

## 2020-10-22 DIAGNOSIS — J449 Chronic obstructive pulmonary disease, unspecified: Secondary | ICD-10-CM | POA: Diagnosis not present

## 2020-10-22 DIAGNOSIS — E118 Type 2 diabetes mellitus with unspecified complications: Secondary | ICD-10-CM | POA: Diagnosis not present

## 2020-10-22 DIAGNOSIS — I509 Heart failure, unspecified: Secondary | ICD-10-CM | POA: Diagnosis not present

## 2020-10-22 DIAGNOSIS — G20 Parkinson's disease: Secondary | ICD-10-CM | POA: Diagnosis not present

## 2020-10-22 DIAGNOSIS — C9590 Leukemia, unspecified not having achieved remission: Secondary | ICD-10-CM | POA: Diagnosis not present

## 2020-10-24 DIAGNOSIS — I639 Cerebral infarction, unspecified: Secondary | ICD-10-CM | POA: Diagnosis not present

## 2020-10-24 DIAGNOSIS — G20 Parkinson's disease: Secondary | ICD-10-CM | POA: Diagnosis not present

## 2020-10-24 DIAGNOSIS — R1310 Dysphagia, unspecified: Secondary | ICD-10-CM | POA: Diagnosis not present

## 2020-10-24 DIAGNOSIS — E785 Hyperlipidemia, unspecified: Secondary | ICD-10-CM | POA: Diagnosis not present

## 2020-10-24 DIAGNOSIS — G2581 Restless legs syndrome: Secondary | ICD-10-CM | POA: Diagnosis not present

## 2020-10-24 DIAGNOSIS — C9590 Leukemia, unspecified not having achieved remission: Secondary | ICD-10-CM | POA: Diagnosis not present

## 2020-10-24 DIAGNOSIS — K219 Gastro-esophageal reflux disease without esophagitis: Secondary | ICD-10-CM | POA: Diagnosis not present

## 2020-10-24 DIAGNOSIS — N183 Chronic kidney disease, stage 3 unspecified: Secondary | ICD-10-CM | POA: Diagnosis not present

## 2020-10-24 DIAGNOSIS — E118 Type 2 diabetes mellitus with unspecified complications: Secondary | ICD-10-CM | POA: Diagnosis not present

## 2020-10-24 DIAGNOSIS — I10 Essential (primary) hypertension: Secondary | ICD-10-CM | POA: Diagnosis not present

## 2020-10-24 DIAGNOSIS — J449 Chronic obstructive pulmonary disease, unspecified: Secondary | ICD-10-CM | POA: Diagnosis not present

## 2020-10-24 DIAGNOSIS — I509 Heart failure, unspecified: Secondary | ICD-10-CM | POA: Diagnosis not present

## 2020-10-24 DIAGNOSIS — I519 Heart disease, unspecified: Secondary | ICD-10-CM | POA: Diagnosis not present

## 2020-10-29 ENCOUNTER — Telehealth (INDEPENDENT_AMBULATORY_CARE_PROVIDER_SITE_OTHER): Admitting: Neurology

## 2020-10-29 DIAGNOSIS — R5381 Other malaise: Secondary | ICD-10-CM | POA: Diagnosis not present

## 2020-10-29 DIAGNOSIS — G20 Parkinson's disease: Secondary | ICD-10-CM

## 2020-10-29 DIAGNOSIS — C9590 Leukemia, unspecified not having achieved remission: Secondary | ICD-10-CM | POA: Diagnosis not present

## 2020-10-29 DIAGNOSIS — I519 Heart disease, unspecified: Secondary | ICD-10-CM | POA: Diagnosis not present

## 2020-10-29 DIAGNOSIS — J449 Chronic obstructive pulmonary disease, unspecified: Secondary | ICD-10-CM | POA: Diagnosis not present

## 2020-10-29 DIAGNOSIS — E118 Type 2 diabetes mellitus with unspecified complications: Secondary | ICD-10-CM | POA: Diagnosis not present

## 2020-10-29 DIAGNOSIS — I509 Heart failure, unspecified: Secondary | ICD-10-CM | POA: Diagnosis not present

## 2020-10-29 MED ORDER — CARBIDOPA-LEVODOPA 25-100 MG PO TABS: 1.0000 | ORAL_TABLET | Freq: Four times a day (QID) | ORAL | 1 refills | Status: AC

## 2020-10-29 MED ORDER — CARBIDOPA-LEVODOPA ER 25-100 MG PO TBCR: 1.0000 | EXTENDED_RELEASE_TABLET | Freq: Four times a day (QID) | ORAL | 1 refills | Status: AC

## 2020-10-29 MED ORDER — ROPINIROLE HCL 0.25 MG PO TABS: 0.2500 mg | ORAL_TABLET | Freq: Every day | ORAL | 1 refills | Status: AC

## 2020-10-29 NOTE — Progress Notes (Signed)
Interim history:  Victor Bell is a 87 year old left-handed gentleman with an underlying complex medical history of diabetes, hyperlipidemia, prostate hypertrophy, hypertension, stroke, leukemia, aortic stenosis, and chronic lung disease who presents for a my chart Video visit for follow up consultation of his right-sided parkinsonism. The patient is accompanied by his daughter, Victor Bell, again today. They are located in her home, where he now stays, I am located in my office at [facility].   I last saw him on 08/22/2017, at which time he reported feeling stable. He had a recent fall and had scraped his left upper and lower extremities.  He had suspected TIA in the interim in April 2019 and went to [facility], had a teleneurology consult, was kept overnight.   He missed an appointment with me in January 2020.   He had an appointment with Debbora Presto, NP on 10/25/2018, at which time he complained of paresthesias in the legs.  He was advised to continue with Sinemet ER and Sinemet IR.  He was started on low-dose ropinirole for restless leg symptoms.  He had a follow-up appointment with Debbora Presto, NP on 10/30/2019, at which time he felt quite stable.   He was advised to continue with his medications.  His daughter emailed in the interim and his ropinirole was increased for breakthrough restless leg symptoms.  Today, 10/29/2020: He reports very little of his own history.  His history is heavily supplemented by his daughter.  He reports having some rough days, he feels dizzy, he is not really able to describe specifically how he feels, daughter reports that he has had instances of worsening right-sided weakness.  She wonders if he has had interim TIAs.  He feels sleepy during the day.  He has been in hospice.  They have a hospice nurse come to them.  He has been taken off of several of his medications per primary care.  He does continue to take tamsulosin and finasteride.  He is also on Sinemet CR and Sinemet IR  1 pill 4 times a day.  If he goes more than 4 hours without his Sinemet, he gets a lot worse with his mobility per daughter.  Ropinirole helps at night for his restless leg symptoms, he takes 0.25 mg or up to 0.5 mg each night.  He has been diagnosed with leukemia with original diagnosis of CLL but now daughter reports that it has been changed to acute leukemia.  He is on morphine as needed, oxygen as needed, and lorazepam as needed. Of note, he was recently hospitalized in August.  He presented with shortness of breath nausea and vomiting.  He was diagnosed with acute pneumonia, he had elevated troponin levels and was seen by cardiology.  I reviewed the hospital records.  The patient's allergies, current medications, family history, past medical history, past social history, past surgical history and problem list were reviewed and updated as appropriate.    Previously (copied from previous notes for reference):   I saw him on 04/19/2017, at which time he reported recuperating slowly from his recent stroke. He was in the hospital for a acute left sided ischemic stroke deemed secondary to left carotid artery stenosis. He had developed side effects on gabapentin which was restarted by his neurosurgeon but stopped again. He had developed right shoulder pain and received an injection in it, had to take Vicodin half a pill every 4 hours.     09/19/2016, at which time he reported 3 recent falls, also lightheadedness. He  was also overdoing things including painting the outside of the house and redoing the driveway. He was not always titrating well enough. He had one episode of transient one-sided weakness. We talked about gait safety and fall risk quite a bit.    I saw him on 03/22/2016, at which time he reported feeling fairly stable. He did have one fall a few months back, [place] the beach. Thankfully, he did not hurt himself, fell in the bathroom and pulled the shower curtain down. He was having some back  pain, was avoiding tramadol. He had some trouble falling asleep with a long-standing history of difficulty with sleep maintenance. He had tried sleeping pills in the past. He was on Sinemet immediate release and CR 1 pill 3 times a day. I suggested we continue with his regimen. His daughter emailed in the interim in May 2018 requesting whether we could increase his Sinemet IR and CR to 1 pill 4 times a day each. I advised her that we could try this.   I saw him on 11/17/2015, at which time he reported doing okay, he had some back pain, for which he had epidural steroid injections. He reported leg pains and history of restless leg syndrome. He had flareup of allergy symptoms, had gone through allergy shots decades ago and was on over-the-counter allergy medication. I asked him to continue with Sinemet and Sinemet CR. He was advised to avoid any decongestants over-the-counter. I suggested we start him on a trial of low-dose gabapentin for his restless leg syndrome. He was advised to avoid any benzodiazepines. His daughter emailed in November 2017 with concerns that he still had restless leg symptoms. I suggested we gradually increased his gabapentin to 200 mg, then 300 mg daily.   Of note, they had to cancel an appointment for 10/06/2015. I saw him on 06/01/2015, at which time he reported not feeling much in the way of difference on the long acting vs. the IR C/L. He would not always remember to take the 3 pills a day, sometimes in midday he would take the IR. He had an increase in the metformin. He lost about 10 lb in 3 months. He felt that his tremor was getting worse. He had occasional lightheadedness upon standing quickly. He cannot exercise very much because of lumbar spinal stenosis and lower back pain, sometimes radiating to the left. He had no recent falls. I suggested he take 1 pill of immediate release Sinemet along with one CR 3 times a day for a total of 6 pills a day. I first met him on 03/03/2015 at  the request of his primary care physician, at which time he reported a prior diagnosis of Parkinson's disease and he is to follow with a neurologist moved away. He was on Sinemet. He was not always remembering his medication. We switched his Sinemet to Sinemet CR 25-100 milligrams strength one tablet 3 times a day about 4 hourly dosing.   03/03/2015: He was previously diagnosed with Parkinson's disease. He has seen Dr. Brandon Melnick. Symptoms date back to 6 years ago with mild progression noted. He started noticing a right hand tremor at rest about 6 years ago. Symptoms have progressed very mildly over the course of time. He has been on C/L for about 4 years.    He has been on Sinemet. He is currently on 25-100 milligrams strength one pill 3 to 4 times a day. He has mild constipation which is manageable. He does not like to drink water  and drinks perhaps one or 2 cups a day. He likes to drink coffee in the morning. He lives with his 5 year old wife, they have 2 grown children, both in the area. Daughter checks on them every day. He has a son who lives about 10 miles away. He worked for [employer]. Thankfully he has not fallen recently. He has a cane and a walker but does not use his walker very much. Sometimes he uses a cane inside the house. He feels that the Sinemet has been helpful. He does not always remember the midday dose and usually averages 2-3 pills a day. It is written for 4 times a day but he does not typically take it 4 times a day. He feels that it lasts about 3 maybe 4 hours in between. He does not report any significant side effects. In particular, he denies any nausea, headache, hallucinations, he has had some blood pressure decrease with time. He has problems sleeping at night at times and is somewhat sleepy during the day and dozes off involuntarily. He has been driving but has limited his driving to daytime driving and local roads only.   He has not noticed any involuntary movements such as  we would call dyskinesias. His neurologist moved away. I reviewed your office note from 01/22/2015, which you kindly included.  His Past Medical History Is Significant For: Past Medical History:  Diagnosis Date   Arthritis    Asthma    CAD (coronary artery disease)    Cancer ([place])    skin    Carotid artery occlusion    CHF (congestive heart failure) (HCC)    Colon polyp    COPD (chronic obstructive pulmonary disease) (HCC)    Diabetes mellitus    GERD (gastroesophageal reflux disease)    Heart disease    Hyperlipidemia    Hypertension    Myocardial infarction ([place])    1998    His Past Surgical History Is Significant For: Past Surgical History:  Procedure Laterality Date   CATARACT EXTRACTION     CHOLECYSTECTOMY     Gall bladder   CORONARY ARTERY BYPASS GRAFT  10/03/96   x7   TONSILLECTOMY      His Family History Is Significant For: Family History  Problem Relation Age of Onset   Heart disease Mother    Aneurysm Sister     His Social History Is Significant For: Social History   Socioeconomic History   Marital status: Married    Spouse name: Not on file   Number of children: 1   Years of education: 11   Highest education level: Not on file  Occupational History   Occupation: Retired  Tobacco Use   Smoking status: Never   Smokeless tobacco: Never  Scientific laboratory technician Use: Never used  Substance and Sexual Activity   Alcohol use: No    Alcohol/week: 0.0 standard drinks   Drug use: No   Sexual activity: Not Currently  Other Topics Concern   Not on file  Social History Narrative   Drinks coffee in the morning    Social Determinants of Health   Financial Resource Strain: Not on file  Food Insecurity: Not on file  Transportation Needs: Not on file  Physical Activity: Not on file  Stress: Not on file  Social Connections: Not on file    His Allergies Are:  Allergies  Allergen Reactions   Amoxicillin Nausea And Vomiting    With high dosing    Niacin And  Related Other (See Comments)    Burning   :   His Current Medications Are:  Outpatient Encounter Medications as of 10/29/2020  Medication Sig   atorvastatin (LIPITOR) 80 MG tablet Take 80 mg by mouth daily.     carbidopa-levodopa (SINEMET IR) 25-100 MG tablet Take 1 tablet by mouth 4 (four) times daily. Pt takes carbidopa-levodopa extended release and other at same time.   Carbidopa-Levodopa ER (SINEMET CR) 25-100 MG tablet controlled release Take 1 tablet by mouth 4 (four) times daily.   cetirizine (ZYRTEC) 10 MG tablet Take 10 mg by mouth daily.   clopidogrel (PLAVIX) 75 MG tablet Take 1 tablet (75 mg total) by mouth daily.   dutasteride (AVODART) 0.5 MG capsule Take 0.5 mg by mouth daily.     finasteride (PROSCAR) 5 MG tablet Take 5 mg by mouth daily.    fluticasone (CUTIVATE) 0.05 % cream Apply topically 2 (two) times daily as needed.   fluticasone (FLONASE) 50 MCG/ACT nasal spray Place 1 spray into both nostrils daily.   metFORMIN (GLUCOPHAGE) 500 MG tablet Take 500 mg by mouth daily.    mirtazapine (REMERON) 7.5 MG tablet Take 7.5 mg by mouth every evening.   Multiple Vitamin (MULTIVITAMIN) tablet Take 1 tablet by mouth daily.   nabumetone (RELAFEN) 500 MG tablet Take 500 mg by mouth 2 (two) times daily.     omeprazole (PRILOSEC) 40 MG capsule Take 40 mg by mouth daily.    rOPINIRole (REQUIP) 0.25 MG tablet Take 1-2 tablets (0.25-0.5 mg total) by mouth at bedtime.   Tamsulosin HCl (FLOMAX) 0.4 MG CAPS Take 0.4 mg by mouth daily.     traMADol (ULTRAM) 50 MG tablet Take 50 mg by mouth every 6 (six) hours as needed.   No facility-administered encounter medications on file as of 10/29/2020.  :  Review of Systems:  Out of a complete 14 point review of systems, all are reviewed and negative with the exception of these symptoms as listed below:  Virtual Visit via Video Note on 10/29/20:   I connected with Drue Second and Victor Bell on 10/29/20 at  1:15 PM EDT by a video enabled  telemedicine application and verified that I am speaking with the correct person using two identifiers.   I discussed the limitations of evaluation and management by telemedicine and the availability of in person appointments. The patient expressed understanding and agreed to proceed.  History of Present Illness: See above.    Observations/Objective: The patient is situated on the couch.  He is in no apparent distress but unable to speak in longer sentences.  No obvious stridor or wheezing noted.  Face is slightly asymmetric with left-sided facial weakness noted but could also be because he is leaning towards the right onto the back of the couch.  Upper body movements are generally preserved, I did not have him stand or walk for me today.  Speech is mildly dysarthric, mildly hypophonic, mild facial masking noted, hearing appears to be mildly impaired.  Assessment and Plan: In summary, Suhaan Perleberg is a very pleasant 87 yo male with an underlying complex medical history of diabetes, hyperlipidemia, prostate hypertrophy, hypertension, stroke, leukemia, aortic stenosis, and chronic lung disease who presents for a my chart Video visit for follow up consultation of his right-sided parkinsonism. who presents for follow-up consultation of his right-sided predominant Parkinson's disease of about 10 years' duration. He had a hospital visit in April 2019 for suspected TIA, no acute stroke was found at the time. He  had a stroke in the recent past which left him with mild right-sided weakness. He was found to have left carotid artery stenosis some years ago and has been followed by vascular surgery for this. Nonsurgical treatment was opted for. He has had falls, and more physical decline over time, recent diagnosis of leukemia has taken an additional toll on his health and added to physical debility.  He has had dizziness spells, no recent actual passing out but has been hospitalized recently with shortness of  breath, has aortic stenosis.  He has had weight loss.  He has been on Sinemet ER and IR about 4 times a day, he continues to benefit from it.  I suggested we could try to lower the dose to see if the dizziness improves but the daughter reports that he really does not do well when he skips a dose or when he goes more than 4 hours without a dose.  Ropinirole at low dose had helped with his restless leg symptoms, he maintains at 0.25 mg tab up to 0.5 mg at night.  We mutually agreed to maintain this regimen.  I renewed his prescriptions.  He is also on prostate medication which can also cause dizziness but the daughter believes that his dizziness is different from medication related dizziness. We talked about the importance of supportive care and comfort care.  He is currently in in-home hospice.   We mutually agreed to do a repeat virtual visit in about 6 months or sooner if needed.  He can schedule with Debbora Presto, NP or myself.  Follow Up Instructions:    I discussed the assessment and treatment plan with the patient. The patient was provided an opportunity to ask questions and all were answered. The patient agreed with the plan and demonstrated an understanding of the instructions.   The patient was advised to call back or seek an in-person evaluation if the symptoms worsen or if the condition fails to improve as anticipated.     I provided 30 minutes of non-face-to-face time during this encounter.   Star Age, MD

## 2020-10-29 NOTE — Patient Instructions (Signed)
Verbal instructions given: We will maintain Sinemet CR, Sinemet IR and Requip prescriptions at the current doses and timings.

## 2020-11-05 DIAGNOSIS — I509 Heart failure, unspecified: Secondary | ICD-10-CM | POA: Diagnosis not present

## 2020-11-05 DIAGNOSIS — I519 Heart disease, unspecified: Secondary | ICD-10-CM | POA: Diagnosis not present

## 2020-11-05 DIAGNOSIS — G20 Parkinson's disease: Secondary | ICD-10-CM | POA: Diagnosis not present

## 2020-11-05 DIAGNOSIS — E118 Type 2 diabetes mellitus with unspecified complications: Secondary | ICD-10-CM | POA: Diagnosis not present

## 2020-11-05 DIAGNOSIS — J449 Chronic obstructive pulmonary disease, unspecified: Secondary | ICD-10-CM | POA: Diagnosis not present

## 2020-11-05 DIAGNOSIS — C9590 Leukemia, unspecified not having achieved remission: Secondary | ICD-10-CM | POA: Diagnosis not present

## 2020-11-09 DIAGNOSIS — Z23 Encounter for immunization: Secondary | ICD-10-CM | POA: Diagnosis not present

## 2020-11-12 DIAGNOSIS — I519 Heart disease, unspecified: Secondary | ICD-10-CM | POA: Diagnosis not present

## 2020-11-12 DIAGNOSIS — C9590 Leukemia, unspecified not having achieved remission: Secondary | ICD-10-CM | POA: Diagnosis not present

## 2020-11-12 DIAGNOSIS — G20 Parkinson's disease: Secondary | ICD-10-CM | POA: Diagnosis not present

## 2020-11-12 DIAGNOSIS — J449 Chronic obstructive pulmonary disease, unspecified: Secondary | ICD-10-CM | POA: Diagnosis not present

## 2020-11-12 DIAGNOSIS — I509 Heart failure, unspecified: Secondary | ICD-10-CM | POA: Diagnosis not present

## 2020-11-12 DIAGNOSIS — E118 Type 2 diabetes mellitus with unspecified complications: Secondary | ICD-10-CM | POA: Diagnosis not present

## 2020-11-17 DIAGNOSIS — C9590 Leukemia, unspecified not having achieved remission: Secondary | ICD-10-CM | POA: Diagnosis not present

## 2020-11-17 DIAGNOSIS — G20 Parkinson's disease: Secondary | ICD-10-CM | POA: Diagnosis not present

## 2020-11-17 DIAGNOSIS — J449 Chronic obstructive pulmonary disease, unspecified: Secondary | ICD-10-CM | POA: Diagnosis not present

## 2020-11-17 DIAGNOSIS — E118 Type 2 diabetes mellitus with unspecified complications: Secondary | ICD-10-CM | POA: Diagnosis not present

## 2020-11-17 DIAGNOSIS — I519 Heart disease, unspecified: Secondary | ICD-10-CM | POA: Diagnosis not present

## 2020-11-17 DIAGNOSIS — I509 Heart failure, unspecified: Secondary | ICD-10-CM | POA: Diagnosis not present

## 2020-11-19 DIAGNOSIS — E118 Type 2 diabetes mellitus with unspecified complications: Secondary | ICD-10-CM | POA: Diagnosis not present

## 2020-11-19 DIAGNOSIS — I519 Heart disease, unspecified: Secondary | ICD-10-CM | POA: Diagnosis not present

## 2020-11-19 DIAGNOSIS — C9590 Leukemia, unspecified not having achieved remission: Secondary | ICD-10-CM | POA: Diagnosis not present

## 2020-11-19 DIAGNOSIS — G20 Parkinson's disease: Secondary | ICD-10-CM | POA: Diagnosis not present

## 2020-11-19 DIAGNOSIS — I509 Heart failure, unspecified: Secondary | ICD-10-CM | POA: Diagnosis not present

## 2020-11-19 DIAGNOSIS — J449 Chronic obstructive pulmonary disease, unspecified: Secondary | ICD-10-CM | POA: Diagnosis not present

## 2020-11-24 DIAGNOSIS — E118 Type 2 diabetes mellitus with unspecified complications: Secondary | ICD-10-CM | POA: Diagnosis not present

## 2020-11-24 DIAGNOSIS — I639 Cerebral infarction, unspecified: Secondary | ICD-10-CM | POA: Diagnosis not present

## 2020-11-24 DIAGNOSIS — G2581 Restless legs syndrome: Secondary | ICD-10-CM | POA: Diagnosis not present

## 2020-11-24 DIAGNOSIS — I519 Heart disease, unspecified: Secondary | ICD-10-CM | POA: Diagnosis not present

## 2020-11-24 DIAGNOSIS — J449 Chronic obstructive pulmonary disease, unspecified: Secondary | ICD-10-CM | POA: Diagnosis not present

## 2020-11-24 DIAGNOSIS — I10 Essential (primary) hypertension: Secondary | ICD-10-CM | POA: Diagnosis not present

## 2020-11-24 DIAGNOSIS — R1310 Dysphagia, unspecified: Secondary | ICD-10-CM | POA: Diagnosis not present

## 2020-11-24 DIAGNOSIS — E785 Hyperlipidemia, unspecified: Secondary | ICD-10-CM | POA: Diagnosis not present

## 2020-11-24 DIAGNOSIS — C9590 Leukemia, unspecified not having achieved remission: Secondary | ICD-10-CM | POA: Diagnosis not present

## 2020-11-24 DIAGNOSIS — G20 Parkinson's disease: Secondary | ICD-10-CM | POA: Diagnosis not present

## 2020-11-24 DIAGNOSIS — K219 Gastro-esophageal reflux disease without esophagitis: Secondary | ICD-10-CM | POA: Diagnosis not present

## 2020-11-24 DIAGNOSIS — N183 Chronic kidney disease, stage 3 unspecified: Secondary | ICD-10-CM | POA: Diagnosis not present

## 2020-11-24 DIAGNOSIS — I509 Heart failure, unspecified: Secondary | ICD-10-CM | POA: Diagnosis not present

## 2020-11-26 DIAGNOSIS — C9590 Leukemia, unspecified not having achieved remission: Secondary | ICD-10-CM | POA: Diagnosis not present

## 2020-11-26 DIAGNOSIS — I509 Heart failure, unspecified: Secondary | ICD-10-CM | POA: Diagnosis not present

## 2020-11-26 DIAGNOSIS — G20 Parkinson's disease: Secondary | ICD-10-CM | POA: Diagnosis not present

## 2020-11-26 DIAGNOSIS — E118 Type 2 diabetes mellitus with unspecified complications: Secondary | ICD-10-CM | POA: Diagnosis not present

## 2020-11-26 DIAGNOSIS — I519 Heart disease, unspecified: Secondary | ICD-10-CM | POA: Diagnosis not present

## 2020-11-26 DIAGNOSIS — J449 Chronic obstructive pulmonary disease, unspecified: Secondary | ICD-10-CM | POA: Diagnosis not present

## 2020-11-27 DIAGNOSIS — C9590 Leukemia, unspecified not having achieved remission: Secondary | ICD-10-CM | POA: Diagnosis not present

## 2020-11-27 DIAGNOSIS — E118 Type 2 diabetes mellitus with unspecified complications: Secondary | ICD-10-CM | POA: Diagnosis not present

## 2020-11-27 DIAGNOSIS — I509 Heart failure, unspecified: Secondary | ICD-10-CM | POA: Diagnosis not present

## 2020-11-27 DIAGNOSIS — J449 Chronic obstructive pulmonary disease, unspecified: Secondary | ICD-10-CM | POA: Diagnosis not present

## 2020-11-27 DIAGNOSIS — G20 Parkinson's disease: Secondary | ICD-10-CM | POA: Diagnosis not present

## 2020-11-27 DIAGNOSIS — I519 Heart disease, unspecified: Secondary | ICD-10-CM | POA: Diagnosis not present

## 2020-12-03 DIAGNOSIS — I509 Heart failure, unspecified: Secondary | ICD-10-CM | POA: Diagnosis not present

## 2020-12-03 DIAGNOSIS — G20 Parkinson's disease: Secondary | ICD-10-CM | POA: Diagnosis not present

## 2020-12-03 DIAGNOSIS — J449 Chronic obstructive pulmonary disease, unspecified: Secondary | ICD-10-CM | POA: Diagnosis not present

## 2020-12-03 DIAGNOSIS — C9590 Leukemia, unspecified not having achieved remission: Secondary | ICD-10-CM | POA: Diagnosis not present

## 2020-12-03 DIAGNOSIS — I519 Heart disease, unspecified: Secondary | ICD-10-CM | POA: Diagnosis not present

## 2020-12-03 DIAGNOSIS — E118 Type 2 diabetes mellitus with unspecified complications: Secondary | ICD-10-CM | POA: Diagnosis not present

## 2020-12-03 LAB — FLOW CYTOMETRY

## 2020-12-08 ENCOUNTER — Telehealth: Payer: Self-pay | Admitting: *Deleted

## 2020-12-08 NOTE — Telephone Encounter (Signed)
Per Dr. Benay Spice: Flow cytometry on peripheral blood returns to look like CLL. Was supposed to have hematology f/u at [facility]. Spoke w/nurse to confirm he is in Hospice care per PCP, Dr. Woody Seller. Forwarded lab result to PCP.

## 2020-12-10 DIAGNOSIS — I519 Heart disease, unspecified: Secondary | ICD-10-CM | POA: Diagnosis not present

## 2020-12-10 DIAGNOSIS — J449 Chronic obstructive pulmonary disease, unspecified: Secondary | ICD-10-CM | POA: Diagnosis not present

## 2020-12-10 DIAGNOSIS — I509 Heart failure, unspecified: Secondary | ICD-10-CM | POA: Diagnosis not present

## 2020-12-10 DIAGNOSIS — C9590 Leukemia, unspecified not having achieved remission: Secondary | ICD-10-CM | POA: Diagnosis not present

## 2020-12-10 DIAGNOSIS — E118 Type 2 diabetes mellitus with unspecified complications: Secondary | ICD-10-CM | POA: Diagnosis not present

## 2020-12-10 DIAGNOSIS — G20 Parkinson's disease: Secondary | ICD-10-CM | POA: Diagnosis not present

## 2020-12-14 DIAGNOSIS — G20 Parkinson's disease: Secondary | ICD-10-CM | POA: Diagnosis not present

## 2020-12-14 DIAGNOSIS — J449 Chronic obstructive pulmonary disease, unspecified: Secondary | ICD-10-CM | POA: Diagnosis not present

## 2020-12-14 DIAGNOSIS — I509 Heart failure, unspecified: Secondary | ICD-10-CM | POA: Diagnosis not present

## 2020-12-14 DIAGNOSIS — I519 Heart disease, unspecified: Secondary | ICD-10-CM | POA: Diagnosis not present

## 2020-12-14 DIAGNOSIS — E118 Type 2 diabetes mellitus with unspecified complications: Secondary | ICD-10-CM | POA: Diagnosis not present

## 2020-12-14 DIAGNOSIS — C9590 Leukemia, unspecified not having achieved remission: Secondary | ICD-10-CM | POA: Diagnosis not present

## 2020-12-21 DIAGNOSIS — I509 Heart failure, unspecified: Secondary | ICD-10-CM | POA: Diagnosis not present

## 2020-12-21 DIAGNOSIS — C9590 Leukemia, unspecified not having achieved remission: Secondary | ICD-10-CM | POA: Diagnosis not present

## 2020-12-21 DIAGNOSIS — I519 Heart disease, unspecified: Secondary | ICD-10-CM | POA: Diagnosis not present

## 2020-12-21 DIAGNOSIS — J449 Chronic obstructive pulmonary disease, unspecified: Secondary | ICD-10-CM | POA: Diagnosis not present

## 2020-12-21 DIAGNOSIS — G20 Parkinson's disease: Secondary | ICD-10-CM | POA: Diagnosis not present

## 2020-12-21 DIAGNOSIS — E118 Type 2 diabetes mellitus with unspecified complications: Secondary | ICD-10-CM | POA: Diagnosis not present

## 2020-12-24 DIAGNOSIS — G2581 Restless legs syndrome: Secondary | ICD-10-CM | POA: Diagnosis not present

## 2020-12-24 DIAGNOSIS — C9590 Leukemia, unspecified not having achieved remission: Secondary | ICD-10-CM | POA: Diagnosis not present

## 2020-12-24 DIAGNOSIS — J449 Chronic obstructive pulmonary disease, unspecified: Secondary | ICD-10-CM | POA: Diagnosis not present

## 2020-12-24 DIAGNOSIS — E785 Hyperlipidemia, unspecified: Secondary | ICD-10-CM | POA: Diagnosis not present

## 2020-12-24 DIAGNOSIS — E118 Type 2 diabetes mellitus with unspecified complications: Secondary | ICD-10-CM | POA: Diagnosis not present

## 2020-12-24 DIAGNOSIS — I519 Heart disease, unspecified: Secondary | ICD-10-CM | POA: Diagnosis not present

## 2020-12-24 DIAGNOSIS — N183 Chronic kidney disease, stage 3 unspecified: Secondary | ICD-10-CM | POA: Diagnosis not present

## 2020-12-24 DIAGNOSIS — I10 Essential (primary) hypertension: Secondary | ICD-10-CM | POA: Diagnosis not present

## 2020-12-24 DIAGNOSIS — I639 Cerebral infarction, unspecified: Secondary | ICD-10-CM | POA: Diagnosis not present

## 2020-12-24 DIAGNOSIS — I509 Heart failure, unspecified: Secondary | ICD-10-CM | POA: Diagnosis not present

## 2020-12-24 DIAGNOSIS — K219 Gastro-esophageal reflux disease without esophagitis: Secondary | ICD-10-CM | POA: Diagnosis not present

## 2020-12-24 DIAGNOSIS — R1310 Dysphagia, unspecified: Secondary | ICD-10-CM | POA: Diagnosis not present

## 2020-12-24 DIAGNOSIS — G20 Parkinson's disease: Secondary | ICD-10-CM | POA: Diagnosis not present

## 2020-12-31 DIAGNOSIS — I519 Heart disease, unspecified: Secondary | ICD-10-CM | POA: Diagnosis not present

## 2020-12-31 DIAGNOSIS — C9590 Leukemia, unspecified not having achieved remission: Secondary | ICD-10-CM | POA: Diagnosis not present

## 2020-12-31 DIAGNOSIS — I509 Heart failure, unspecified: Secondary | ICD-10-CM | POA: Diagnosis not present

## 2020-12-31 DIAGNOSIS — E118 Type 2 diabetes mellitus with unspecified complications: Secondary | ICD-10-CM | POA: Diagnosis not present

## 2020-12-31 DIAGNOSIS — J449 Chronic obstructive pulmonary disease, unspecified: Secondary | ICD-10-CM | POA: Diagnosis not present

## 2020-12-31 DIAGNOSIS — G20 Parkinson's disease: Secondary | ICD-10-CM | POA: Diagnosis not present

## 2021-01-05 DIAGNOSIS — I519 Heart disease, unspecified: Secondary | ICD-10-CM | POA: Diagnosis not present

## 2021-01-05 DIAGNOSIS — E118 Type 2 diabetes mellitus with unspecified complications: Secondary | ICD-10-CM | POA: Diagnosis not present

## 2021-01-05 DIAGNOSIS — C9590 Leukemia, unspecified not having achieved remission: Secondary | ICD-10-CM | POA: Diagnosis not present

## 2021-01-05 DIAGNOSIS — J449 Chronic obstructive pulmonary disease, unspecified: Secondary | ICD-10-CM | POA: Diagnosis not present

## 2021-01-05 DIAGNOSIS — G20 Parkinson's disease: Secondary | ICD-10-CM | POA: Diagnosis not present

## 2021-01-05 DIAGNOSIS — I509 Heart failure, unspecified: Secondary | ICD-10-CM | POA: Diagnosis not present

## 2021-01-07 DIAGNOSIS — G20 Parkinson's disease: Secondary | ICD-10-CM | POA: Diagnosis not present

## 2021-01-07 DIAGNOSIS — I519 Heart disease, unspecified: Secondary | ICD-10-CM | POA: Diagnosis not present

## 2021-01-07 DIAGNOSIS — I509 Heart failure, unspecified: Secondary | ICD-10-CM | POA: Diagnosis not present

## 2021-01-07 DIAGNOSIS — C9590 Leukemia, unspecified not having achieved remission: Secondary | ICD-10-CM | POA: Diagnosis not present

## 2021-01-07 DIAGNOSIS — E118 Type 2 diabetes mellitus with unspecified complications: Secondary | ICD-10-CM | POA: Diagnosis not present

## 2021-01-07 DIAGNOSIS — J449 Chronic obstructive pulmonary disease, unspecified: Secondary | ICD-10-CM | POA: Diagnosis not present

## 2021-01-14 DIAGNOSIS — I509 Heart failure, unspecified: Secondary | ICD-10-CM | POA: Diagnosis not present

## 2021-01-14 DIAGNOSIS — E118 Type 2 diabetes mellitus with unspecified complications: Secondary | ICD-10-CM | POA: Diagnosis not present

## 2021-01-14 DIAGNOSIS — I519 Heart disease, unspecified: Secondary | ICD-10-CM | POA: Diagnosis not present

## 2021-01-14 DIAGNOSIS — C9590 Leukemia, unspecified not having achieved remission: Secondary | ICD-10-CM | POA: Diagnosis not present

## 2021-01-14 DIAGNOSIS — G20 Parkinson's disease: Secondary | ICD-10-CM | POA: Diagnosis not present

## 2021-01-14 DIAGNOSIS — J449 Chronic obstructive pulmonary disease, unspecified: Secondary | ICD-10-CM | POA: Diagnosis not present

## 2021-01-21 DIAGNOSIS — I519 Heart disease, unspecified: Secondary | ICD-10-CM | POA: Diagnosis not present

## 2021-01-21 DIAGNOSIS — J449 Chronic obstructive pulmonary disease, unspecified: Secondary | ICD-10-CM | POA: Diagnosis not present

## 2021-01-21 DIAGNOSIS — G20 Parkinson's disease: Secondary | ICD-10-CM | POA: Diagnosis not present

## 2021-01-21 DIAGNOSIS — I509 Heart failure, unspecified: Secondary | ICD-10-CM | POA: Diagnosis not present

## 2021-01-21 DIAGNOSIS — C9590 Leukemia, unspecified not having achieved remission: Secondary | ICD-10-CM | POA: Diagnosis not present

## 2021-01-21 DIAGNOSIS — E118 Type 2 diabetes mellitus with unspecified complications: Secondary | ICD-10-CM | POA: Diagnosis not present

## 2021-01-24 DIAGNOSIS — N183 Chronic kidney disease, stage 3 unspecified: Secondary | ICD-10-CM | POA: Diagnosis not present

## 2021-01-24 DIAGNOSIS — R1310 Dysphagia, unspecified: Secondary | ICD-10-CM | POA: Diagnosis not present

## 2021-01-24 DIAGNOSIS — E785 Hyperlipidemia, unspecified: Secondary | ICD-10-CM | POA: Diagnosis not present

## 2021-01-24 DIAGNOSIS — I519 Heart disease, unspecified: Secondary | ICD-10-CM | POA: Diagnosis not present

## 2021-01-24 DIAGNOSIS — J449 Chronic obstructive pulmonary disease, unspecified: Secondary | ICD-10-CM | POA: Diagnosis not present

## 2021-01-24 DIAGNOSIS — K219 Gastro-esophageal reflux disease without esophagitis: Secondary | ICD-10-CM | POA: Diagnosis not present

## 2021-01-24 DIAGNOSIS — G2581 Restless legs syndrome: Secondary | ICD-10-CM | POA: Diagnosis not present

## 2021-01-24 DIAGNOSIS — I509 Heart failure, unspecified: Secondary | ICD-10-CM | POA: Diagnosis not present

## 2021-01-24 DIAGNOSIS — C9590 Leukemia, unspecified not having achieved remission: Secondary | ICD-10-CM | POA: Diagnosis not present

## 2021-01-24 DIAGNOSIS — E118 Type 2 diabetes mellitus with unspecified complications: Secondary | ICD-10-CM | POA: Diagnosis not present

## 2021-01-24 DIAGNOSIS — I10 Essential (primary) hypertension: Secondary | ICD-10-CM | POA: Diagnosis not present

## 2021-01-24 DIAGNOSIS — G20 Parkinson's disease: Secondary | ICD-10-CM | POA: Diagnosis not present

## 2021-01-24 DIAGNOSIS — I639 Cerebral infarction, unspecified: Secondary | ICD-10-CM | POA: Diagnosis not present

## 2021-01-28 DIAGNOSIS — G20 Parkinson's disease: Secondary | ICD-10-CM | POA: Diagnosis not present

## 2021-01-28 DIAGNOSIS — J449 Chronic obstructive pulmonary disease, unspecified: Secondary | ICD-10-CM | POA: Diagnosis not present

## 2021-01-28 DIAGNOSIS — C9590 Leukemia, unspecified not having achieved remission: Secondary | ICD-10-CM | POA: Diagnosis not present

## 2021-01-28 DIAGNOSIS — I509 Heart failure, unspecified: Secondary | ICD-10-CM | POA: Diagnosis not present

## 2021-01-28 DIAGNOSIS — I519 Heart disease, unspecified: Secondary | ICD-10-CM | POA: Diagnosis not present

## 2021-01-28 DIAGNOSIS — E118 Type 2 diabetes mellitus with unspecified complications: Secondary | ICD-10-CM | POA: Diagnosis not present

## 2021-02-01 DIAGNOSIS — J449 Chronic obstructive pulmonary disease, unspecified: Secondary | ICD-10-CM | POA: Diagnosis not present

## 2021-02-01 DIAGNOSIS — G20 Parkinson's disease: Secondary | ICD-10-CM | POA: Diagnosis not present

## 2021-02-01 DIAGNOSIS — I509 Heart failure, unspecified: Secondary | ICD-10-CM | POA: Diagnosis not present

## 2021-02-01 DIAGNOSIS — E118 Type 2 diabetes mellitus with unspecified complications: Secondary | ICD-10-CM | POA: Diagnosis not present

## 2021-02-01 DIAGNOSIS — I519 Heart disease, unspecified: Secondary | ICD-10-CM | POA: Diagnosis not present

## 2021-02-01 DIAGNOSIS — C9590 Leukemia, unspecified not having achieved remission: Secondary | ICD-10-CM | POA: Diagnosis not present

## 2021-02-02 DIAGNOSIS — I509 Heart failure, unspecified: Secondary | ICD-10-CM | POA: Diagnosis not present

## 2021-02-02 DIAGNOSIS — G20 Parkinson's disease: Secondary | ICD-10-CM | POA: Diagnosis not present

## 2021-02-02 DIAGNOSIS — I519 Heart disease, unspecified: Secondary | ICD-10-CM | POA: Diagnosis not present

## 2021-02-02 DIAGNOSIS — E118 Type 2 diabetes mellitus with unspecified complications: Secondary | ICD-10-CM | POA: Diagnosis not present

## 2021-02-02 DIAGNOSIS — C9590 Leukemia, unspecified not having achieved remission: Secondary | ICD-10-CM | POA: Diagnosis not present

## 2021-02-02 DIAGNOSIS — J449 Chronic obstructive pulmonary disease, unspecified: Secondary | ICD-10-CM | POA: Diagnosis not present

## 2021-02-04 DIAGNOSIS — G20 Parkinson's disease: Secondary | ICD-10-CM | POA: Diagnosis not present

## 2021-02-04 DIAGNOSIS — J449 Chronic obstructive pulmonary disease, unspecified: Secondary | ICD-10-CM | POA: Diagnosis not present

## 2021-02-04 DIAGNOSIS — E118 Type 2 diabetes mellitus with unspecified complications: Secondary | ICD-10-CM | POA: Diagnosis not present

## 2021-02-04 DIAGNOSIS — I509 Heart failure, unspecified: Secondary | ICD-10-CM | POA: Diagnosis not present

## 2021-02-04 DIAGNOSIS — C9590 Leukemia, unspecified not having achieved remission: Secondary | ICD-10-CM | POA: Diagnosis not present

## 2021-02-04 DIAGNOSIS — I519 Heart disease, unspecified: Secondary | ICD-10-CM | POA: Diagnosis not present

## 2021-02-08 DIAGNOSIS — C9590 Leukemia, unspecified not having achieved remission: Secondary | ICD-10-CM | POA: Diagnosis not present

## 2021-02-08 DIAGNOSIS — I509 Heart failure, unspecified: Secondary | ICD-10-CM | POA: Diagnosis not present

## 2021-02-08 DIAGNOSIS — I519 Heart disease, unspecified: Secondary | ICD-10-CM | POA: Diagnosis not present

## 2021-02-08 DIAGNOSIS — E118 Type 2 diabetes mellitus with unspecified complications: Secondary | ICD-10-CM | POA: Diagnosis not present

## 2021-02-08 DIAGNOSIS — J449 Chronic obstructive pulmonary disease, unspecified: Secondary | ICD-10-CM | POA: Diagnosis not present

## 2021-02-08 DIAGNOSIS — G20 Parkinson's disease: Secondary | ICD-10-CM | POA: Diagnosis not present

## 2021-02-10 DIAGNOSIS — I509 Heart failure, unspecified: Secondary | ICD-10-CM | POA: Diagnosis not present

## 2021-02-10 DIAGNOSIS — G20 Parkinson's disease: Secondary | ICD-10-CM | POA: Diagnosis not present

## 2021-02-10 DIAGNOSIS — E118 Type 2 diabetes mellitus with unspecified complications: Secondary | ICD-10-CM | POA: Diagnosis not present

## 2021-02-10 DIAGNOSIS — I519 Heart disease, unspecified: Secondary | ICD-10-CM | POA: Diagnosis not present

## 2021-02-10 DIAGNOSIS — J449 Chronic obstructive pulmonary disease, unspecified: Secondary | ICD-10-CM | POA: Diagnosis not present

## 2021-02-10 DIAGNOSIS — C9590 Leukemia, unspecified not having achieved remission: Secondary | ICD-10-CM | POA: Diagnosis not present

## 2021-02-11 DIAGNOSIS — C9590 Leukemia, unspecified not having achieved remission: Secondary | ICD-10-CM | POA: Diagnosis not present

## 2021-02-11 DIAGNOSIS — J449 Chronic obstructive pulmonary disease, unspecified: Secondary | ICD-10-CM | POA: Diagnosis not present

## 2021-02-11 DIAGNOSIS — I509 Heart failure, unspecified: Secondary | ICD-10-CM | POA: Diagnosis not present

## 2021-02-11 DIAGNOSIS — G20 Parkinson's disease: Secondary | ICD-10-CM | POA: Diagnosis not present

## 2021-02-11 DIAGNOSIS — I519 Heart disease, unspecified: Secondary | ICD-10-CM | POA: Diagnosis not present

## 2021-02-11 DIAGNOSIS — E118 Type 2 diabetes mellitus with unspecified complications: Secondary | ICD-10-CM | POA: Diagnosis not present

## 2021-02-12 DIAGNOSIS — J449 Chronic obstructive pulmonary disease, unspecified: Secondary | ICD-10-CM | POA: Diagnosis not present

## 2021-02-12 DIAGNOSIS — C9590 Leukemia, unspecified not having achieved remission: Secondary | ICD-10-CM | POA: Diagnosis not present

## 2021-02-12 DIAGNOSIS — I509 Heart failure, unspecified: Secondary | ICD-10-CM | POA: Diagnosis not present

## 2021-02-12 DIAGNOSIS — G20 Parkinson's disease: Secondary | ICD-10-CM | POA: Diagnosis not present

## 2021-02-12 DIAGNOSIS — I519 Heart disease, unspecified: Secondary | ICD-10-CM | POA: Diagnosis not present

## 2021-02-12 DIAGNOSIS — E118 Type 2 diabetes mellitus with unspecified complications: Secondary | ICD-10-CM | POA: Diagnosis not present

## 2021-02-16 ENCOUNTER — Encounter: Payer: Self-pay | Admitting: Neurology

## 2021-02-18 NOTE — Telephone Encounter (Signed)
Mailed condolence card to the pt's address on file.

## 2021-02-24 DEATH — deceased

## 2022-02-18 IMAGING — DX DG CHEST 1V PORT
1 series · 1 of 1 positions shown · non-contrast
Comparison: 09/13/2020

CLINICAL DATA: Nausea and dyspnea.

EXAM:
PORTABLE CHEST 1 VIEW

[chest ap]
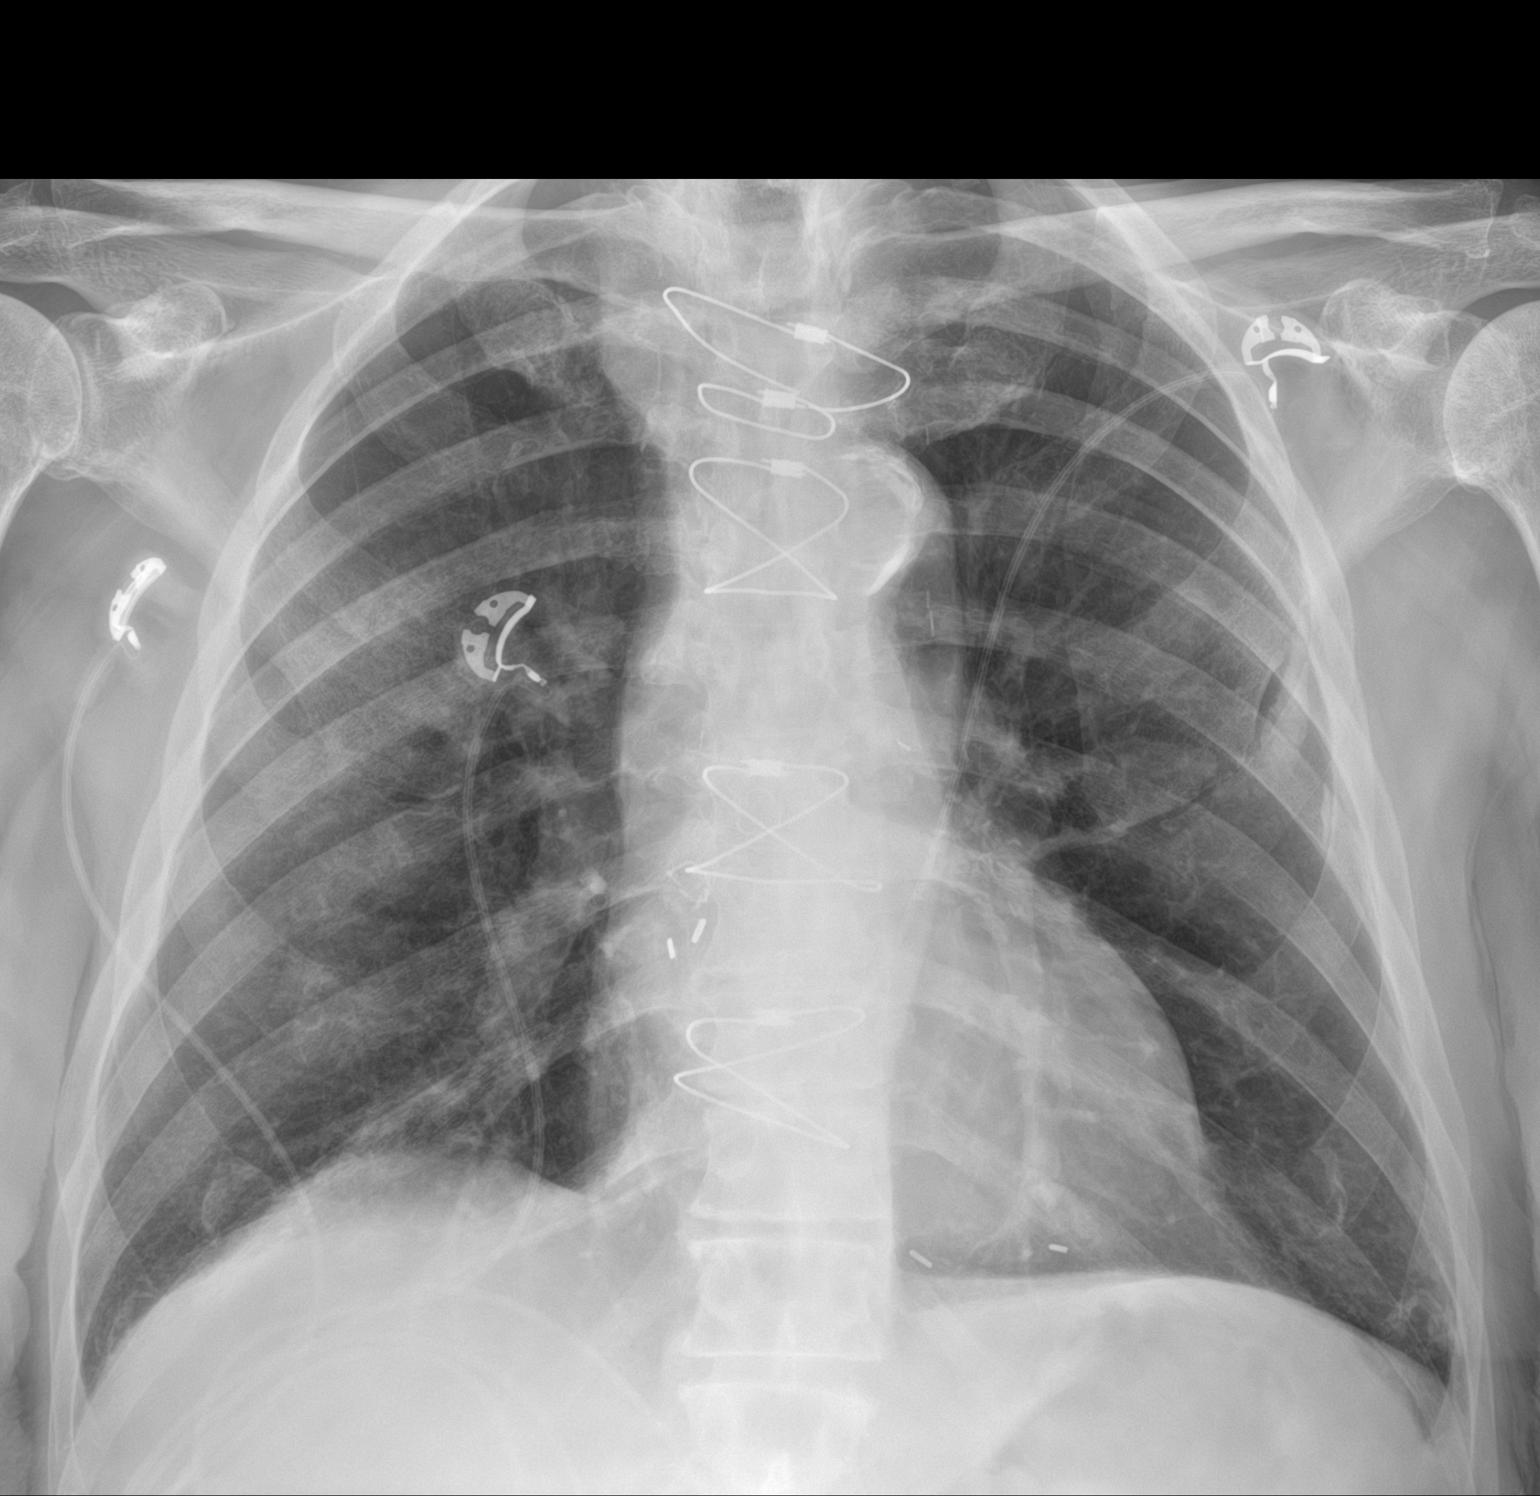

[1 of 1 positions shown; findings below may reference images not displayed]

FINDINGS: Previous median sternotomy and CABG procedure. Normal heart size. No
pleural effusion. Similar appearance of bilateral interstitial and
airspace opacities within the right lung and left upper lobe.
IMPRESSION: No change in aeration to the lungs compared with prior exam.
